# Patient Record
Sex: Male | Born: 1975 | Race: White | Hispanic: No | Smoking: Current every day smoker
Health system: Southern US, Community
[De-identification: ages and names within clinical notes are randomized; demographics above are authoritative.]

## PROBLEM LIST (undated history)

## (undated) DIAGNOSIS — F429 Obsessive-compulsive disorder, unspecified: Secondary | ICD-10-CM

## (undated) DIAGNOSIS — F419 Anxiety disorder, unspecified: Secondary | ICD-10-CM

## (undated) DIAGNOSIS — R519 Headache, unspecified: Secondary | ICD-10-CM

## (undated) DIAGNOSIS — D3610 Benign neoplasm of peripheral nerves and autonomic nervous system, unspecified: Secondary | ICD-10-CM

## (undated) DIAGNOSIS — K219 Gastro-esophageal reflux disease without esophagitis: Secondary | ICD-10-CM

## (undated) HISTORY — DX: Headache, unspecified: R51.9

## (undated) HISTORY — DX: Anxiety disorder, unspecified: F41.9

## (undated) HISTORY — DX: Obsessive-compulsive disorder, unspecified: F42.9

## (undated) HISTORY — DX: Gastro-esophageal reflux disease without esophagitis: K21.9

## (undated) HISTORY — PX: WISDOM TOOTH EXTRACTION: SHX21

---

## 2001-04-30 ENCOUNTER — Emergency Department (HOSPITAL_COMMUNITY): Admission: EM | Admit: 2001-04-30 | Discharge: 2001-04-30 | Payer: Self-pay | Admitting: Emergency Medicine

## 2002-09-09 HISTORY — PX: CIRCUMCISION: SUR203

## 2002-10-26 ENCOUNTER — Emergency Department (HOSPITAL_COMMUNITY): Admission: EM | Admit: 2002-10-26 | Discharge: 2002-10-26 | Payer: Self-pay | Admitting: *Deleted

## 2002-12-20 ENCOUNTER — Emergency Department (HOSPITAL_COMMUNITY): Admission: EM | Admit: 2002-12-20 | Discharge: 2002-12-20 | Payer: Self-pay | Admitting: Emergency Medicine

## 2003-01-17 ENCOUNTER — Emergency Department (HOSPITAL_COMMUNITY): Admission: EM | Admit: 2003-01-17 | Discharge: 2003-01-18 | Payer: Self-pay | Admitting: *Deleted

## 2003-02-12 ENCOUNTER — Emergency Department (HOSPITAL_COMMUNITY): Admission: EM | Admit: 2003-02-12 | Discharge: 2003-02-12 | Payer: Self-pay | Admitting: Emergency Medicine

## 2003-04-22 ENCOUNTER — Emergency Department (HOSPITAL_COMMUNITY): Admission: EM | Admit: 2003-04-22 | Discharge: 2003-04-23 | Payer: Self-pay | Admitting: Emergency Medicine

## 2004-03-15 ENCOUNTER — Emergency Department (HOSPITAL_COMMUNITY): Admission: EM | Admit: 2004-03-15 | Discharge: 2004-03-15 | Payer: Self-pay | Admitting: Family Medicine

## 2004-06-10 ENCOUNTER — Emergency Department (HOSPITAL_COMMUNITY): Admission: EM | Admit: 2004-06-10 | Discharge: 2004-06-10 | Payer: Self-pay | Admitting: Family Medicine

## 2004-06-12 ENCOUNTER — Emergency Department (HOSPITAL_COMMUNITY): Admission: EM | Admit: 2004-06-12 | Discharge: 2004-06-12 | Payer: Self-pay | Admitting: Emergency Medicine

## 2005-10-01 ENCOUNTER — Emergency Department (HOSPITAL_COMMUNITY): Admission: EM | Admit: 2005-10-01 | Discharge: 2005-10-01 | Payer: Self-pay | Admitting: Family Medicine

## 2005-10-01 ENCOUNTER — Ambulatory Visit (HOSPITAL_COMMUNITY): Admission: RE | Admit: 2005-10-01 | Discharge: 2005-10-01 | Payer: Self-pay | Admitting: Family Medicine

## 2005-10-07 ENCOUNTER — Emergency Department (HOSPITAL_COMMUNITY): Admission: EM | Admit: 2005-10-07 | Discharge: 2005-10-07 | Payer: Self-pay | Admitting: Family Medicine

## 2006-01-30 ENCOUNTER — Emergency Department: Payer: Self-pay | Admitting: General Practice

## 2006-03-03 ENCOUNTER — Emergency Department (HOSPITAL_COMMUNITY): Admission: EM | Admit: 2006-03-03 | Discharge: 2006-03-03 | Payer: Self-pay | Admitting: Family Medicine

## 2006-03-07 ENCOUNTER — Emergency Department (HOSPITAL_COMMUNITY): Admission: EM | Admit: 2006-03-07 | Discharge: 2006-03-07 | Payer: Self-pay | Admitting: Emergency Medicine

## 2006-06-19 ENCOUNTER — Emergency Department (HOSPITAL_COMMUNITY): Admission: EM | Admit: 2006-06-19 | Discharge: 2006-06-19 | Payer: Self-pay | Admitting: Family Medicine

## 2006-06-26 ENCOUNTER — Emergency Department (HOSPITAL_COMMUNITY): Admission: EM | Admit: 2006-06-26 | Discharge: 2006-06-26 | Payer: Self-pay | Admitting: Family Medicine

## 2009-09-09 HISTORY — PX: WISDOM TOOTH EXTRACTION: SHX21

## 2011-01-09 ENCOUNTER — Encounter: Payer: Self-pay | Admitting: Family Medicine

## 2011-01-09 NOTE — Progress Notes (Signed)
Subjective:    Patient ID: Christopher Wyatt, male    DOB: [birth year], 35 y.o.   MRN: [id number]  HPI Comments: Prozac makes him wake up sleepy and feels weird and therefore patient stopped medications for the past 1 week. The  effexor worked the best for him but he wanted a  Change for unspecified reasons. Review of the  Record shows he  Frequently requests changes in medicines without adequate trial.  Anxiety Symptoms include decreased concentration, excessive worry and malaise. Patient reports no chest pain, compulsions, confusion, depressed mood, dizziness, dry mouth, feeling of choking, hyperventilation, impotence, insomnia, irritability, muscle tension, nausea, nervous/anxious behavior, obsessions, palpitations, panic, restlessness, shortness of breath or suicidal ideas. Symptoms occur most days. The severity of symptoms is moderate. The quality of sleep is poor. Nighttime awakenings: one to two.   Compliance with medications is 0-25%.   Past Medical History  Diagnosis Date  . Anxiety     Chronic  . OCD (obsessive compulsive disorder)    No past surgical history on file. No current outpatient prescriptions on file prior to visit.   Allergies  Allergen Reactions  . Paxil     Erectile dysfunction    There is no immunization history on file for this patient. History   Social History  . Marital Status: Single    Spouse Name: N/A    Number of Children: N/A  . Years of Education: N/A   Occupational History  . Not on file.   Social History Main Topics  . Smoking status: Current Everyday Smoker -- 1.0 packs/day    Types: Cigarettes  . Smokeless tobacco: Not on file  . Alcohol Use: Yes  . Drug Use: No  . Sexually Active: Not on file   Other Topics Concern  . Not on file   Social History Narrative  . No narrative on file      Review of Systems  Constitutional: Positive for fatigue. Negative for fever, chills, diaphoresis, activity change, appetite change,  irritability and unexpected weight change.  HENT: Negative.   Eyes: Negative.   Respiratory: Negative.  Negative for shortness of breath.   Cardiovascular: Negative.  Negative for chest pain and palpitations.  Gastrointestinal: Negative.  Negative for nausea.  Genitourinary: Negative.  Negative for impotence.  Musculoskeletal: Negative.   Neurological: Negative.  Negative for dizziness, tremors, seizures, syncope, speech difficulty, weakness, light-headedness and numbness.  Hematological: Negative.   Psychiatric/Behavioral: Positive for behavioral problems and decreased concentration. Negative for suicidal ideas, hallucinations, confusion, dysphoric mood and agitation. The patient is not nervous/anxious and does not have insomnia.    BP 100/80  Pulse 72  Temp 96.8 F (36 C)  Resp 20  Wt 173 lb (78.472 kg)    Objective:   Physical Exam  Constitutional: He is oriented to person, place, and time. He appears well-developed and well-nourished.  HENT:  Head: Normocephalic and atraumatic.  Eyes: Conjunctivae and EOM are normal. Pupils are equal, round, and reactive to light.  Neck: Normal range of motion. Neck supple. No JVD present. No tracheal deviation present. No thyromegaly present.  Cardiovascular: Normal rate and normal heart sounds.   Pulmonary/Chest: Effort normal and breath sounds normal. No stridor.  Abdominal: Soft. Bowel sounds are normal.  Musculoskeletal: Normal range of motion.  Neurological: He is alert and oriented to person, place, and time. He has normal reflexes.  Skin: Skin is warm and dry.  Psychiatric: Thought content normal. His mood appears anxious. His affect is not angry, not blunt,  not labile and not inappropriate. His speech is rapid and/or pressured. His speech is not delayed, not tangential and not slurred. He is is hyperactive. He is not actively hallucinating. Thought content is not paranoid and not delusional. Cognition and memory are normal. He expresses  impulsivity. He does not express inappropriate judgment. He does not exhibit a depressed mood. He expresses no homicidal and no suicidal ideation. He expresses no suicidal plans and no homicidal plans. He is communicative.       Pressure of  Speech restless. He is attentive.          Assessment & Plan:  General anxiety disorder OCD  Plan:  Rx Effexor XR 75 mg daily for  1 week then increase to 2 tablets daily #60 RFx2. D/C and  Stay off the Prozac. Advised patient the need for compliance and  Consistency and give the  Medications time to work. Our next option is to consider Psychiatry evaluation and treatment. Discussed with patient that he has a history of not staying on medications as prescirbed. RTc in  4 weeks.

## 2011-03-21 ENCOUNTER — Ambulatory Visit (INDEPENDENT_AMBULATORY_CARE_PROVIDER_SITE_OTHER): Payer: Self-pay | Admitting: Otolaryngology

## 2011-03-21 DIAGNOSIS — K121 Other forms of stomatitis: Secondary | ICD-10-CM

## 2011-03-21 DIAGNOSIS — R070 Pain in throat: Secondary | ICD-10-CM

## 2012-10-08 ENCOUNTER — Ambulatory Visit (INDEPENDENT_AMBULATORY_CARE_PROVIDER_SITE_OTHER): Payer: Medicaid Other | Admitting: Otolaryngology

## 2012-10-08 DIAGNOSIS — H608X9 Other otitis externa, unspecified ear: Secondary | ICD-10-CM

## 2012-12-01 ENCOUNTER — Encounter: Payer: Self-pay | Admitting: Family Medicine

## 2012-12-01 ENCOUNTER — Ambulatory Visit (INDEPENDENT_AMBULATORY_CARE_PROVIDER_SITE_OTHER): Payer: Medicaid Other | Admitting: Family Medicine

## 2012-12-01 VITALS — BP 120/86 | HR 86 | Temp 98.1°F | Resp 16 | Wt 179.0 lb

## 2012-12-01 DIAGNOSIS — F429 Obsessive-compulsive disorder, unspecified: Secondary | ICD-10-CM | POA: Insufficient documentation

## 2012-12-01 DIAGNOSIS — F419 Anxiety disorder, unspecified: Secondary | ICD-10-CM | POA: Insufficient documentation

## 2012-12-01 DIAGNOSIS — F411 Generalized anxiety disorder: Secondary | ICD-10-CM

## 2012-12-01 MED ORDER — ESCITALOPRAM OXALATE 10 MG PO TABS: 10.0000 mg | ORAL_TABLET | Freq: Every day | ORAL | Status: DC
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
## 2012-12-28 ENCOUNTER — Telehealth: Payer: Self-pay | Admitting: Family Medicine

## 2012-12-28 DIAGNOSIS — F411 Generalized anxiety disorder: Secondary | ICD-10-CM

## 2012-12-28 NOTE — Telephone Encounter (Signed)
We can increase to 20 mg poqdaay

## 2012-12-29 MED ORDER — ESCITALOPRAM OXALATE 20 MG PO TABS: 20.0000 mg | ORAL_TABLET | Freq: Every day | ORAL | Status: DC

## 2012-12-29 NOTE — Telephone Encounter (Signed)
rf rx per protocol

## 2013-02-26 ENCOUNTER — Ambulatory Visit (INDEPENDENT_AMBULATORY_CARE_PROVIDER_SITE_OTHER): Payer: Medicaid Other | Admitting: Physician Assistant

## 2013-02-26 ENCOUNTER — Encounter: Payer: Self-pay | Admitting: Physician Assistant

## 2013-02-26 ENCOUNTER — Ambulatory Visit (INDEPENDENT_AMBULATORY_CARE_PROVIDER_SITE_OTHER): Payer: Medicaid Other

## 2013-02-26 VITALS — BP 117/71 | HR 86 | Temp 99.1°F | Ht 71.5 in | Wt 171.0 lb

## 2013-02-26 DIAGNOSIS — M25561 Pain in right knee: Secondary | ICD-10-CM

## 2013-02-26 DIAGNOSIS — M25569 Pain in unspecified knee: Secondary | ICD-10-CM

## 2013-02-26 MED ORDER — MELOXICAM 15 MG PO TABS: 15.0000 mg | ORAL_TABLET | Freq: Every day | ORAL | Status: DC

## 2013-02-26 NOTE — Progress Notes (Signed)
Subjective:     Patient ID: Christopher Wyatt, male   DOB: 1976/05/08, 36 y.o.   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       Objective:   Physical Exam  Nursing note and vitals reviewed. + effusion to the R knee +++ TTP of the medial knee(ant and lateral) Decrease in ROM due to sx + patellar click + patellar compression Good strength distal Xray- no loose bodies or fx, will be over-read      Assessment:     R knee pain    Plan:     Trial of Mobic Neoprene brace with patellar stabilizer for work Work note for [employer] F/u if sx cont may need rf to Ortho

## 2013-02-26 NOTE — Patient Instructions (Addendum)
Smoking Cessation Quitting smoking is important to your health and has many advantages. However, it is not always easy to quit since nicotine is a very addictive drug. Often times, people try 3 times or more before being able to quit. This document explains the best ways for you to prepare to quit smoking. Quitting takes hard work and a lot of effort, but you can do it. ADVANTAGES OF QUITTING SMOKING  You will live longer, feel better, and live better.  Your body will feel the impact of quitting smoking almost immediately.  Within 20 minutes, blood pressure decreases. Your pulse returns to its normal level.  After 8 hours, carbon monoxide levels in the blood return to normal. Your oxygen level increases.  After 24 hours, the chance of having a heart attack starts to decrease. Your breath, hair, and body stop smelling like smoke.  After 48 hours, damaged nerve endings begin to recover. Your sense of taste and smell improve.  After 72 hours, the body is virtually free of nicotine. Your bronchial tubes relax and breathing becomes easier.  After 2 to 12 weeks, lungs can hold more air. Exercise becomes easier and circulation improves.  The risk of having a heart attack, stroke, cancer, or lung disease is greatly reduced.  After 1 year, the risk of coronary heart disease is cut in half.  After 5 years, the risk of stroke falls to the same as a nonsmoker.  After 10 years, the risk of lung cancer is cut in half and the risk of other cancers decreases significantly.  After 15 years, the risk of coronary heart disease drops, usually to the level of a nonsmoker.  If you are pregnant, quitting smoking will improve your chances of having a healthy baby.  The people you live with, especially any children, will be healthier.  You will have extra money to spend on things other than cigarettes. QUESTIONS TO THINK ABOUT BEFORE ATTEMPTING TO QUIT You may want to talk about your answers with your  caregiver.  Why do you want to quit?  If you tried to quit in the past, what helped and what did not?  What will be the most difficult situations for you after you quit? How will you plan to handle them?  Who can help you through the tough times? Your family? Friends? A caregiver?  What pleasures do you get from smoking? What ways can you still get pleasure if you quit? Here are some questions to ask your caregiver:  How can you help me to be successful at quitting?  What medicine do you think would be best for me and how should I take it?  What should I do if I need more help?  What is smoking withdrawal like? How can I get information on withdrawal? GET READY  Set a quit date.  Change your environment by getting rid of all cigarettes, ashtrays, matches, and lighters in your home, car, or work. Do not let people smoke in your home.  Review your past attempts to quit. Think about what worked and what did not. GET SUPPORT AND ENCOURAGEMENT You have a better chance of being successful if you have help. You can get support in many ways.  Tell your family, friends, and co-workers that you are going to quit and need their support. Ask them not to smoke around you.  Get individual, group, or telephone counseling and support. Programs are available at local hospitals and health centers. Call your local health department for   information about programs in your area.  Spiritual beliefs and practices may help some smokers quit.  Download a "quit meter" on your computer to keep track of quit statistics, such as how long you have gone without smoking, cigarettes not smoked, and money saved.  Get a self-help book about quitting smoking and staying off of tobacco. LEARN NEW SKILLS AND BEHAVIORS  Distract yourself from urges to smoke. Talk to someone, go for a walk, or occupy your time with a task.  Change your normal routine. Take a different route to work. Drink tea instead of coffee.  Eat breakfast in a different place.  Reduce your stress. Take a hot bath, exercise, or read a book.  Plan something enjoyable to do every day. Reward yourself for not smoking.  Explore interactive web-based programs that specialize in helping you quit. GET MEDICINE AND USE IT CORRECTLY Medicines can help you stop smoking and decrease the urge to smoke. Combining medicine with the above behavioral methods and support can greatly increase your chances of successfully quitting smoking.  Nicotine replacement therapy helps deliver nicotine to your body without the negative effects and risks of smoking. Nicotine replacement therapy includes nicotine gum, lozenges, inhalers, nasal sprays, and skin patches. Some may be available over-the-counter and others require a prescription.  Antidepressant medicine helps people abstain from smoking, but how this works is unknown. This medicine is available by prescription.  Nicotinic receptor partial agonist medicine simulates the effect of nicotine in your brain. This medicine is available by prescription. Ask your caregiver for advice about which medicines to use and how to use them based on your health history. Your caregiver will tell you what side effects to look out for if you choose to be on a medicine or therapy. Carefully read the information on the package. Do not use any other product containing nicotine while using a nicotine replacement product.  RELAPSE OR DIFFICULT SITUATIONS Most relapses occur within the first 3 months after quitting. Do not be discouraged if you start smoking again. Remember, most people try several times before finally quitting. You may have symptoms of withdrawal because your body is used to nicotine. You may crave cigarettes, be irritable, feel very hungry, cough often, get headaches, or have difficulty concentrating. The withdrawal symptoms are only temporary. They are strongest when you first quit, but they will go away within  10 14 days. To reduce the chances of relapse, try to:  Avoid drinking alcohol. Drinking lowers your chances of successfully quitting.  Reduce the amount of caffeine you consume. Once you quit smoking, the amount of caffeine in your body increases and can give you symptoms, such as a rapid heartbeat, sweating, and anxiety.  Avoid smokers because they can make you want to smoke.  Do not let weight gain distract you. Many smokers will gain weight when they quit, usually less than 10 pounds. Eat a healthy diet and stay active. You can always lose the weight gained after you quit.  Find ways to improve your mood other than smoking. FOR MORE INFORMATION  www.smokefree.gov  Document Released: 08/20/2001 Document Revised: 02/25/2012 Document Reviewed: 12/05/2011 [facility] Patient Information 2014 [place], [place]. Patella Problems (Patellofemoral Syndrome) This syndrome is caused by changes in the undersurface of the kneecap (patella). The changes vary from minor inflammation to major changes such as breakdown of the cartilage on the undersurface of the patella. The major changes can be seen with an arthroscope (a small, pencil-sized telescope). These changes can result from various factors.  These factors may arise from abnormal tracking (movement or malalignment) of the patella. Normally the Patella is in its normal groove located between the condyles (grooved end) of the femur (thigh bone). Abnormal movement leads to increased pressure in the patellofemoral joint. This leads to swelling in the cartilage, inflammation and pain. SYMPTOMS  The patient with this syndrome usually has an ache in the knee. It is often aggravated by:  Prolonged sitting.  Squatting.  Climbing stairs.  Running down hill.  Other exercising that stresses the knee. Other findings may include the knee giving way, swelling, and or locking. TREATMENT  The treatment will depend on the cause of the problem. Sometimes the  solution is as simple as cutting down on activities. Giving your joint a rest with the use of crutches and braces can also help. This is generally followed by strengthening exercises. RECOVERY Recovery from a patellar problem depends on the type of problem in your knee and on the treatment required. If conservative treatment works the recovery period may be as little as three to four weeks. If more aggressive therapy such as surgery is required, the recovery period may be several months. Your caregiver will discuss this with you. HOME CARE INSTRUCTIONS  Following exercise, use an ice pack for twenty to thirty minutes three to four times per day. Use a towel between your ice pack and the skin.  Reduction of inflammation with anti-inflammatories may be helpful. Only take over-the-counter or prescription medicines for pain, discomfort, or fever as directed by your caregiver.  Taping the knee or using a neoprene sleeve with a patellar cutout to provide better tracking of the patella may give relief.  Muscle (quadriceps) strengthening exercises are helpful. Follow your caregiver's advice.  Muscle stretching prior to exercise may be helpful.  Soft tissue therapy using ultrasound, and diathermy may be helpful.  If conservative therapy is not effective, surgery may provide relief. During arthroscopy, your caregiver may discover a rough surface beneath your kneecap. If this happens, your caregiver may smooth this out by shaving the surface. SEEK MEDICAL CARE IF: If you have surgery, see your caregiver if:  There is increased bleeding or clear fluid (more than a small spot) from the wound.  You notice redness, swelling, or increasing pain in the wound.  Pus is coming from wound.  You develop an unexplained oral temperature above 102 F (38.9 C) develops, or as your caregiver suggests.  You notice a foul smell coming from the wound or dressing.  You develop increasing pain or stiffness in your  knee. SEEK IMMEDIATE MEDICAL CARE IF:   You develop a rash.  You have difficulty breathing.  You have any allergic problems. MAKE SURE YOU:   Understand these instructions.  Will watch your condition.  Will get help right away if you are not doing well or get worse. Document Released: 08/23/2000 Document Revised: 11/18/2011 Document Reviewed: 09/12/2008 [facility] Patient Information 2014 [place], [place].

## 2013-03-01 ENCOUNTER — Ambulatory Visit: Payer: Self-pay | Admitting: Family Medicine

## 2013-05-12 ENCOUNTER — Other Ambulatory Visit: Payer: Self-pay | Admitting: Family Medicine

## 2013-05-13 ENCOUNTER — Other Ambulatory Visit: Payer: Self-pay | Admitting: Family Medicine

## 2013-05-13 NOTE — Telephone Encounter (Signed)
Wants to know if you can up his LEXAPRO dose to see if it helps. Pt states he hasnt notice any effects since you put him on it.

## 2013-05-14 ENCOUNTER — Encounter (HOSPITAL_COMMUNITY): Payer: Self-pay | Admitting: Emergency Medicine

## 2013-05-14 ENCOUNTER — Telehealth: Payer: Self-pay | Admitting: Family Medicine

## 2013-05-14 ENCOUNTER — Emergency Department (HOSPITAL_COMMUNITY): Payer: Medicaid Other

## 2013-05-14 ENCOUNTER — Emergency Department (HOSPITAL_COMMUNITY)
Admission: EM | Admit: 2013-05-14 | Discharge: 2013-05-14 | Disposition: A | Discharge status: Home / Self Care | Payer: Medicaid Other | Attending: Emergency Medicine | Admitting: Emergency Medicine

## 2013-05-14 DIAGNOSIS — W208XXA Other cause of strike by thrown, projected or falling object, initial encounter: Secondary | ICD-10-CM | POA: Insufficient documentation

## 2013-05-14 DIAGNOSIS — F411 Generalized anxiety disorder: Secondary | ICD-10-CM | POA: Insufficient documentation

## 2013-05-14 DIAGNOSIS — S9030XA Contusion of unspecified foot, initial encounter: Secondary | ICD-10-CM | POA: Insufficient documentation

## 2013-05-14 DIAGNOSIS — F429 Obsessive-compulsive disorder, unspecified: Secondary | ICD-10-CM | POA: Insufficient documentation

## 2013-05-14 DIAGNOSIS — Y990 Civilian activity done for income or pay: Secondary | ICD-10-CM | POA: Insufficient documentation

## 2013-05-14 DIAGNOSIS — Y939 Activity, unspecified: Secondary | ICD-10-CM | POA: Insufficient documentation

## 2013-05-14 DIAGNOSIS — Y929 Unspecified place or not applicable: Secondary | ICD-10-CM | POA: Insufficient documentation

## 2013-05-14 DIAGNOSIS — Z79899 Other long term (current) drug therapy: Secondary | ICD-10-CM | POA: Insufficient documentation

## 2013-05-14 DIAGNOSIS — S9032XA Contusion of left foot, initial encounter: Secondary | ICD-10-CM

## 2013-05-14 DIAGNOSIS — F17200 Nicotine dependence, unspecified, uncomplicated: Secondary | ICD-10-CM | POA: Insufficient documentation

## 2013-05-14 MED ORDER — HYDROCODONE-ACETAMINOPHEN 5-325 MG PO TABS: 1.0000 | ORAL_TABLET | Freq: Four times a day (QID) | ORAL | Status: DC | PRN

## 2013-05-14 MED ORDER — OXYCODONE-ACETAMINOPHEN 5-325 MG PO TABS
2.0000 | ORAL_TABLET | Freq: Once | ORAL | Status: AC
  Administered 2013-05-14: 2 via ORAL
  Filled 2013-05-14: qty 2

## 2013-05-14 NOTE — Telephone Encounter (Signed)
In ED last PM with foot injury.  In severe pain.  They did not give him an Rx for pain.  Told him to take Tylenol. Please call him in something for pain.  Already has routine appt with on Monday.

## 2013-05-14 NOTE — ED Provider Notes (Signed)
CSN: [id number]     Arrival date & time 05/14/13  0551 History   First MD Initiated Contact with Patient 05/14/13 0600     Chief Complaint  Patient presents with  . Foot Injury   Patient is a 37 y.o. male presenting with foot injury. The history is provided by the patient.  Foot Injury Location:  Foot Time since incident:  4 hours Injury: yes   Foot location:  L foot Pain details:    Quality:  Aching   Severity:  Moderate   Onset quality:  Sudden   Timing:  Constant   Progression:  Worsening Chronicity:  New Relieved by:  Rest Worsened by:  Activity pt reports he dropped heavy chain on his left foot. No other injury reported This occurred at work  Past Medical History  Diagnosis Date  . OCD (obsessive compulsive disorder)   . Anxiety     Chronic   History reviewed. No pertinent past surgical history. No family history on file. History  Substance Use Topics  . Smoking status: Current Every Day Smoker -- 1.00 packs/day    Types: Cigarettes  . Smokeless tobacco: Not on file  . Alcohol Use: Yes    Review of Systems  Musculoskeletal: Positive for joint swelling.  Neurological: Negative for weakness.    Allergies  Paroxetine hcl  Home Medications   Current Outpatient Rx  Name  Route  Sig  Dispense  Refill  . escitalopram (LEXAPRO) 20 MG tablet   Oral   Take 1 tablet (20 mg total) by mouth daily.   30 tablet   3   . meloxicam (MOBIC) 15 MG tablet   Oral   Take 1 tablet (15 mg total) by mouth daily.   15 tablet   0    BP 138/89  Pulse 96  Temp(Src) 98.2 F (36.8 C) (Oral)  Resp 20  Ht 6' (1.829 m)  Wt 178 lb (80.74 kg)  BMI 24.14 kg/m2  SpO2 98% Physical Exam CONSTITUTIONAL: Well developed/well nourished HEAD: Normocephalic/atraumatic EYES: EOMI ENMT: Mucous membranes moist NECK: supple no meningeal signs CV: S1/S2 noted, no murmurs/rubs/gallops noted LUNGS: Lungs are clear to auscultation bilaterally, no apparent distress ABDOMEN: soft,  nontender, no rebound or guarding NEURO: Pt is awake/alert, moves all extremitiesx4 EXTREMITIES: pulses normal, full ROM Tenderness with some bruising along left great toe and left 2nd toe.  There is no deformity or laceration noted.  The left foot is warm to touch.  There is no obvious subungual hematoma noted.  He is able move his toes There is no tenderness to left ankle. SKIN: warm, color normal PSYCH: no abnormalities of mood noted  ED Course  Procedures (including critical care time) Labs Review Labs Reviewed - No data to display Imaging Review No results found.  MDM  No diagnosis found. Nursing notes including past medical history and social history reviewed and considered in documentation xrays reviewed and considered     Joya Gaskins, MD 05/14/13 [phone]

## 2013-05-14 NOTE — Telephone Encounter (Signed)
Norco called to pharmacy and patient is aware.

## 2013-05-14 NOTE — Telephone Encounter (Signed)
He can have ten Norco 5/325 q 6 hrs prn pain called out.

## 2013-05-14 NOTE — ED Notes (Signed)
Patient states he dropped a chain and hook on his left foot at work.

## 2013-05-17 ENCOUNTER — Ambulatory Visit (INDEPENDENT_AMBULATORY_CARE_PROVIDER_SITE_OTHER): Payer: Medicaid Other | Admitting: Family Medicine

## 2013-05-17 ENCOUNTER — Encounter: Payer: Self-pay | Admitting: Family Medicine

## 2013-05-17 VITALS — BP 110/68 | HR 80 | Temp 98.2°F | Resp 16 | Wt 177.0 lb

## 2013-05-17 DIAGNOSIS — F411 Generalized anxiety disorder: Secondary | ICD-10-CM

## 2013-05-17 MED ORDER — ESCITALOPRAM OXALATE 20 MG PO TABS: 20.0000 mg | ORAL_TABLET | Freq: Every day | ORAL | Status: DC

## 2013-05-17 MED ORDER — BUSPIRONE HCL 7.5 MG PO TABS: 7.5000 mg | ORAL_TABLET | Freq: Two times a day (BID) | ORAL | Status: DC | PRN

## 2013-05-17 NOTE — Progress Notes (Signed)
Subjective:    Patient ID: Christopher Wyatt, male    DOB: 03-17-1976, 37 y.o.   MRN: [id number]  HPI  Patient has been on Lexapro 20 mg by mouth daily since March. He is very satisfied with the way the medicine is working. He states that he is much improved. On most days, he does not feel any anxiety at all. On occasion, he reports low-level anxiety that feels like a panic attack is about to happen, but it never does. He has not had a panic attack in the last 6 months.  He would like to increase the Lexapro to try to compensate for this.  He is very hesitant to take any addictive medication.  He is currently on Norco for pain in his foot. He dropped a chain on his left great toe at work. He suffered a severe contusion to the left great toe. The toe nail looks like it will fall off eventually due to the subungal hematoma.  He is being treated for this through workers compensation. Past Medical History  Diagnosis Date  . OCD (obsessive compulsive disorder)   . Anxiety     Chronic   Current Outpatient Prescriptions on File Prior to Visit  Medication Sig Dispense Refill  . HYDROcodone-acetaminophen (NORCO) 5-325 MG per tablet Take 1 tablet by mouth every 6 (six) hours as needed for pain.  10 tablet  0  . meloxicam (MOBIC) 15 MG tablet Take 1 tablet (15 mg total) by mouth daily.  15 tablet  0   No current facility-administered medications on file prior to visit.   Allergies  Allergen Reactions  . Paroxetine Hcl     Erectile dysfunction   History   Social History  . Marital Status: Single    Spouse Name: N/A    Number of Children: N/A  . Years of Education: N/A   Occupational History  . Not on file.   Social History Main Topics  . Smoking status: Current Every Day Smoker -- 1.00 packs/day    Types: Cigarettes  . Smokeless tobacco: Not on file  . Alcohol Use: Yes  . Drug Use: No  . Sexual Activity: Not on file   Other Topics Concern  . Not on file   Social History Narrative   . No narrative on file     Review of Systems  All other systems reviewed and are negative.       Objective:   Physical Exam  Vitals reviewed. Constitutional: He appears well-developed and well-nourished.  Neck: Neck supple. No thyromegaly present.  Cardiovascular: Normal rate, regular rhythm and normal heart sounds.  Exam reveals no gallop and no friction rub.   No murmur heard. Pulmonary/Chest: Effort normal and breath sounds normal. No respiratory distress. He has no wheezes. He has no rales.  Abdominal: Soft. Bowel sounds are normal.  Lymphadenopathy:    He has no cervical adenopathy.   left great toe is bruised distal to the IP joint. The toe nail is partially separated secondary to a subungual hematoma.         Assessment & Plan:  1. GAD (generalized anxiety disorder) Continue Lexapro 20 mg by mouth daily. I explained to the patient that this is the maximum dose of this medication. I will add BuSpar 7.5 mg by mouth twice a day as needed for anxiety. I gave the patient 30 tablets with 0 refills. I warned that it could possibly make him sedated. I recommended he not drive when he takes this  medication. Also warned him about the potential for habituation with repeated use. - busPIRone (BUSPAR) 7.5 MG tablet; Take 1 tablet (7.5 mg total) by mouth 2 (two) times daily as needed (use as needed for anxiety).  Dispense: 30 tablet; Refill: 0 - escitalopram (LEXAPRO) 20 MG tablet; Take 1 tablet (20 mg total) by mouth daily.  Dispense: 30 tablet; Refill: 11

## 2013-06-11 ENCOUNTER — Telehealth: Payer: Self-pay | Admitting: Family Medicine

## 2013-06-11 NOTE — Telephone Encounter (Signed)
C/O UTI.  Has slight penile discharge with a lot of urgency and burning.  NTBS, no appt available today.  Offered appt for Monday, he is going to call back.  In meantime drink plenty of water, cranberry juice.

## 2013-06-14 ENCOUNTER — Telehealth: Payer: Self-pay | Admitting: Family Medicine

## 2013-06-14 NOTE — Telephone Encounter (Signed)
Please call him regarding his yeast infection.  He is having discomfort Call [phone]

## 2013-06-15 ENCOUNTER — Ambulatory Visit (INDEPENDENT_AMBULATORY_CARE_PROVIDER_SITE_OTHER): Payer: Medicaid Other | Admitting: Family Medicine

## 2013-06-15 ENCOUNTER — Encounter: Payer: Self-pay | Admitting: Family Medicine

## 2013-06-15 ENCOUNTER — Ambulatory Visit: Payer: Medicaid Other | Admitting: Family Medicine

## 2013-06-15 VITALS — BP 110/70 | HR 68 | Temp 98.6°F | Resp 18 | Wt 174.0 lb

## 2013-06-15 DIAGNOSIS — N390 Urinary tract infection, site not specified: Secondary | ICD-10-CM | POA: Insufficient documentation

## 2013-06-15 DIAGNOSIS — R369 Urethral discharge, unspecified: Secondary | ICD-10-CM

## 2013-06-15 LAB — URINALYSIS, ROUTINE W REFLEX MICROSCOPIC
Bilirubin Urine: NEGATIVE
Glucose, UA: NEGATIVE mg/dL
Ketones, ur: NEGATIVE mg/dL
Nitrite: NEGATIVE
Protein, ur: 100 mg/dL — AB
Specific Gravity, Urine: >1.03 — ABNORMAL HIGH (ref 1.005–1.030)
Urobilinogen, UA: 2 mg/dL — ABNORMAL HIGH (ref 0.0–1.0)
pH: 6 (ref 5.0–8.0)

## 2013-06-15 LAB — URINALYSIS, MICROSCOPIC ONLY
Casts: NONE SEEN
Crystals: NONE SEEN

## 2013-06-15 MED ORDER — CIPROFLOXACIN HCL 500 MG PO TABS: 500.0000 mg | ORAL_TABLET | Freq: Two times a day (BID) | ORAL | Status: DC

## 2013-06-15 MED ORDER — PHENAZOPYRIDINE HCL 100 MG PO TABS: 100.0000 mg | ORAL_TABLET | Freq: Three times a day (TID) | ORAL | Status: DC | PRN

## 2013-06-15 NOTE — Progress Notes (Signed)
  Subjective:    Patient ID: Christopher Wyatt, male    DOB: Feb 12, 1976, 37 y.o.   MRN: [id number]  HPI  Patient here with dysuria and drainage from his penis for the past week. He sexually active states he has one partner however they have variations in their sexual activity including anal sex. He has has had no recent sexually transmitted diseases. He states that he noticed a milky white discharge from his urethra and he also has some redness and crusting around the penis. He has a piercing through his penis which he has not cleaned this week secondary to tenderness. Over the past week he's had a lot of burning sensation with a urethral discharge where he goes every 30-40 minutes at times he only dribbles. He denies any abdominal pain or fever. He's also noticed a tender spot in his groin and he thought he pulled a muscle.  He states that he is very nervous and does not want me to examine him. He wanted to swab himself for a urethral discharge. We offered to have him see our male provider and he refused this as well  Review of Systems - per above  GEN- denies fatigue, fever, weight loss,weakness, recent illness CVS- denies chest pain, palpitations RESP- denies SOB, cough, wheeze ABD- denies N/V, change in stools, abd pain GU-+dysuria, hematuria, dribbling, incontinence        Objective:   Physical Exam GEN-NAD, alert and oriented x 3 ABD-NABS,soft, NT, ND Lymph- Right inguinal lymph node, TTP GU-Pt Refused       Assessment & Plan:

## 2013-06-15 NOTE — Assessment & Plan Note (Signed)
Neg trichomonas seen in urine Pt swabbed himself for GC/Chlamydia, refused exam multiple times Refused IM injection States he is monogamous  Very difficult to get him to understand importance of exam/meds

## 2013-06-15 NOTE — Telephone Encounter (Signed)
Pt has appt today

## 2013-06-15 NOTE — Assessment & Plan Note (Signed)
Urine sent for culture. Possible prostatitis as well but he declined exam For now will start cipro which will cover prostate infection, UTI, await further results pyridum also given

## 2013-06-15 NOTE — Patient Instructions (Signed)
Take the antibiotics as prescribed  Take the pyridium Drink plenty of water  F/U as previous

## 2013-06-16 LAB — URINE CULTURE
Colony Count: NO GROWTH
Organism ID, Bacteria: NO GROWTH

## 2013-06-17 LAB — GC/CHLAMYDIA PROBE AMP
CT Probe RNA: NEGATIVE
GC Probe RNA: POSITIVE — AB

## 2013-06-18 ENCOUNTER — Ambulatory Visit (INDEPENDENT_AMBULATORY_CARE_PROVIDER_SITE_OTHER): Payer: Medicaid Other | Admitting: Family Medicine

## 2013-06-18 DIAGNOSIS — A5400 Gonococcal infection of lower genitourinary tract, unspecified: Secondary | ICD-10-CM

## 2013-06-18 DIAGNOSIS — A549 Gonococcal infection, unspecified: Secondary | ICD-10-CM

## 2013-06-18 MED ORDER — CEFTRIAXONE SODIUM 250 MG IJ SOLR
250.0000 mg | Freq: Once | INTRAMUSCULAR | Status: AC
  Administered 2013-06-18: 250 mg via INTRAMUSCULAR

## 2013-06-18 MED ORDER — AZITHROMYCIN 500 MG PO TABS: ORAL_TABLET | ORAL | Status: DC

## 2013-06-18 NOTE — Progress Notes (Signed)
Im ceftriaxone 250 mg IM per provider order.  [facility] [place] DHHS Communicable Disease report completed and faxed to [facility] Department

## 2013-06-18 NOTE — Patient Instructions (Signed)
Take the azithromycin as directed Rocephin Injection given

## 2013-06-20 NOTE — Progress Notes (Signed)
Patient ID: Christopher Wyatt, male   DOB: 1976/04/21, 37 y.o.   MRN: [id number]  Pt here to discuss lab results. He is positive for Gonorrhea, UC neg, Trichomonas neg, chlamydia neg. States monogomous relationship. Discussed transmission of STD He has been on Cipro but this has failing resistance and no true UTI.   After about 10 minutes of discussion pt agreed to have Rocephin injection-  Will also take Azithromycin 1gram x 1   Discussed he will need to have his fiancee treated, she can go to health department if needed  Nurse to report STD to [facility]   Stop Cipro No sexually activity for 1 week,  Discussed wearing condoms

## 2013-07-03 ENCOUNTER — Other Ambulatory Visit: Payer: Self-pay | Admitting: Family Medicine

## 2013-07-05 NOTE — Telephone Encounter (Signed)
Ok to refill 

## 2013-07-05 NOTE — Telephone Encounter (Signed)
ok 

## 2013-07-12 ENCOUNTER — Ambulatory Visit: Payer: Medicaid Other | Admitting: Family Medicine

## 2013-07-16 ENCOUNTER — Encounter: Payer: Self-pay | Admitting: Family Medicine

## 2013-07-16 ENCOUNTER — Ambulatory Visit (INDEPENDENT_AMBULATORY_CARE_PROVIDER_SITE_OTHER): Payer: Self-pay | Admitting: Family Medicine

## 2013-07-16 VITALS — BP 128/80 | HR 68 | Temp 98.2°F | Resp 18 | Ht 71.0 in | Wt 174.0 lb

## 2013-07-16 DIAGNOSIS — M25569 Pain in unspecified knee: Secondary | ICD-10-CM

## 2013-07-16 DIAGNOSIS — M25561 Pain in right knee: Secondary | ICD-10-CM

## 2013-07-16 MED ORDER — HYDROCODONE-ACETAMINOPHEN 5-325 MG PO TABS: 1.0000 | ORAL_TABLET | Freq: Four times a day (QID) | ORAL | Status: DC | PRN

## 2013-07-16 MED ORDER — NAPROXEN 500 MG PO TABS: 500.0000 mg | ORAL_TABLET | Freq: Two times a day (BID) | ORAL | Status: DC

## 2013-07-16 NOTE — Patient Instructions (Signed)
Ice your knee  Naprosyn Twice a day for inflammation for next 2 weeks Take the pain pill as needed F/U as needed

## 2013-07-18 DIAGNOSIS — M25569 Pain in unspecified knee: Secondary | ICD-10-CM | POA: Insufficient documentation

## 2013-07-18 NOTE — Progress Notes (Signed)
  Subjective:    Patient ID: Christopher Wyatt, male    DOB: 1976/07/26, 37 y.o.   MRN: [id number]  HPI  Pt here with right knee pain for the past 2 days. History of recurrent right knee pain where his knee cap pops out of place. 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 He was evaluated in July of this year at [facility], xrays showed no acute abnormality. When his knee flares which is very few months, he feels like it may give out on him Asked for note for tonight due to ongoing pain.  Review of Systems  GEN- denies fatigue, fever, weight loss,weakness, recent illness MSK- + joint pain, muscle aches, injury       Objective:   Physical Exam GEN-NAD, alert and oriented x 3 MSK- Right knee- normal appearance, no effusion, no warmth, pain with extension, TTP medially and over patella, normal patella movement on exam, liagments in tact +crepitus right knee Left Knee- normal inspection, no effusion, FROM Bilateral Ankle- FROM       Assessment & Plan:

## 2013-07-18 NOTE — Assessment & Plan Note (Signed)
No effusion seen today ? If he is getting some subluxation at the patella He wants to hold off ortho right now due to insurance Given NSAIDS, 10 tablets of hydrocodone Work note for [employer]

## 2013-09-15 ENCOUNTER — Ambulatory Visit: Payer: Self-pay | Admitting: Physician Assistant

## 2013-09-24 ENCOUNTER — Ambulatory Visit: Payer: Medicaid Other | Admitting: Family Medicine

## 2013-10-05 ENCOUNTER — Ambulatory Visit: Payer: Medicaid Other | Admitting: Family Medicine

## 2013-10-06 ENCOUNTER — Ambulatory Visit: Payer: Medicaid Other | Admitting: Family Medicine

## 2013-10-15 ENCOUNTER — Ambulatory Visit (INDEPENDENT_AMBULATORY_CARE_PROVIDER_SITE_OTHER): Payer: Medicaid Other | Admitting: Family Medicine

## 2013-10-15 ENCOUNTER — Encounter: Payer: Self-pay | Admitting: Family Medicine

## 2013-10-15 VITALS — BP 128/76 | HR 80 | Temp 98.1°F | Resp 16 | Ht 72.0 in | Wt 174.0 lb

## 2013-10-15 DIAGNOSIS — K529 Noninfective gastroenteritis and colitis, unspecified: Secondary | ICD-10-CM

## 2013-10-15 DIAGNOSIS — K5289 Other specified noninfective gastroenteritis and colitis: Secondary | ICD-10-CM

## 2013-10-15 MED ORDER — PROMETHAZINE HCL 25 MG PO TABS: 25.0000 mg | ORAL_TABLET | Freq: Four times a day (QID) | ORAL | Status: DC | PRN

## 2013-10-15 NOTE — Progress Notes (Signed)
   Subjective:    Patient ID: Christopher Wyatt, male    DOB: 05-23-1976, 38 y.o.   MRN: [id number]  HPI Patient experienced an episode of abdominal cramping and vomiting this morning at about 5:30 in the morning. He then had explosive watery diarrhea. He has had 3 other episodes of vomiting and diarrhea up until his office visit. He denies any fevers although he reports chills. He denies any rhinorrhea cough or sore throat. He denies any otalgia or sinus pain. He denies any myalgias. Past Medical History  Diagnosis Date  . OCD (obsessive compulsive disorder)   . Anxiety     Chronic   Current Outpatient Prescriptions on File Prior to Visit  Medication Sig Dispense Refill  . busPIRone (BUSPAR) 7.5 MG tablet TAKE ONE TABLET BY MOUTH TWICE DAILY AS NEEDED FOR ANXIETY  30 tablet  0  . escitalopram (LEXAPRO) 20 MG tablet Take 1 tablet (20 mg total) by mouth daily.  30 tablet  11   No current facility-administered medications on file prior to visit.   Allergies  Allergen Reactions  . Paroxetine Hcl     Erectile dysfunction   History   Social History  . Marital Status: Single    Spouse Name: N/A    Number of Children: N/A  . Years of Education: N/A   Occupational History  . Not on file.   Social History Main Topics  . Smoking status: Current Every Day Smoker -- 1.00 packs/day    Types: Cigarettes  . Smokeless tobacco: Not on file  . Alcohol Use: Yes  . Drug Use: No  . Sexual Activity: Not on file   Other Topics Concern  . Not on file   Social History Narrative  . No narrative on file     Review of Systems  All other systems reviewed and are negative.       Objective:   Physical Exam  Vitals reviewed. Constitutional: He appears well-developed and well-nourished.  HENT:  Head: Normocephalic and atraumatic.  Right Ear: External ear normal.  Left Ear: External ear normal.  Nose: Nose normal.  Mouth/Throat: Oropharynx is clear and moist. No oropharyngeal exudate.   Eyes: Conjunctivae are normal. No scleral icterus.  Neck: Neck supple.  Cardiovascular: Normal rate, regular rhythm and normal heart sounds.   No murmur heard. Pulmonary/Chest: Effort normal and breath sounds normal. No respiratory distress. He has no wheezes. He has no rales.  Abdominal: Soft. Bowel sounds are normal. He exhibits no distension. There is no tenderness. There is no rebound and no guarding.  Lymphadenopathy:    He has no cervical adenopathy.          Assessment & Plan:  1. Gastroenteritis I recommended a BRAT diet. I recommended Phenergan 25 mg every 6 hours as needed for nausea or vomiting. I recommended Imodium over-the-counter for diarrhea. I recommended tincture of time. I anticipate spontaneous resolution in 48-72 hours.  I recommended he push fluids until then. - promethazine (PHENERGAN) 25 MG tablet; Take 1 tablet (25 mg total) by mouth every 6 (six) hours as needed for nausea or vomiting.  Dispense: 20 tablet; Refill: 0

## 2013-11-05 ENCOUNTER — Ambulatory Visit (INDEPENDENT_AMBULATORY_CARE_PROVIDER_SITE_OTHER): Payer: Medicaid Other | Admitting: Family Medicine

## 2013-11-05 ENCOUNTER — Encounter: Payer: Self-pay | Admitting: Family Medicine

## 2013-11-05 VITALS — BP 138/76 | HR 80 | Temp 98.1°F | Resp 20 | Ht 72.0 in | Wt 167.5 lb

## 2013-11-05 DIAGNOSIS — M25579 Pain in unspecified ankle and joints of unspecified foot: Secondary | ICD-10-CM

## 2013-11-05 DIAGNOSIS — M25572 Pain in left ankle and joints of left foot: Secondary | ICD-10-CM

## 2013-11-05 DIAGNOSIS — F411 Generalized anxiety disorder: Secondary | ICD-10-CM

## 2013-11-05 DIAGNOSIS — F17200 Nicotine dependence, unspecified, uncomplicated: Secondary | ICD-10-CM

## 2013-11-05 DIAGNOSIS — F419 Anxiety disorder, unspecified: Secondary | ICD-10-CM

## 2013-11-05 DIAGNOSIS — F429 Obsessive-compulsive disorder, unspecified: Secondary | ICD-10-CM

## 2013-11-05 MED ORDER — NICOTINE 21 MG/24HR TD PT24: 21.0000 mg | MEDICATED_PATCH | Freq: Every day | TRANSDERMAL | Status: DC

## 2013-11-05 MED ORDER — BUSPIRONE HCL 10 MG PO TABS: 10.0000 mg | ORAL_TABLET | Freq: Two times a day (BID) | ORAL | Status: DC

## 2013-11-05 NOTE — Patient Instructions (Addendum)
Continue current medications Eat during your night shift, use protein shake or supplement of more fruit and veggies  You need to get in 3 meals a day Try the nicotine patch once a day Buspar increased to twice a day F/U 2 months for medications

## 2013-11-05 NOTE — Assessment & Plan Note (Signed)
Per above medication changes 

## 2013-11-05 NOTE — Assessment & Plan Note (Signed)
Counseled on cessation we'll try him on a nicotine patches

## 2013-11-05 NOTE — Assessment & Plan Note (Signed)
Continue his Lexapro I will change him to BuSpar 10 mg twice a day with the importance of taking the BuSpar twice a day as prescribed he will followup in 2 months for recheck

## 2013-11-05 NOTE — Progress Notes (Signed)
Patient ID: Christopher Wyatt, male   DOB: 03-17-1976, 38 y.o.   MRN: [id number]   Subjective:    Patient ID: Christopher Wyatt, male    DOB: 24-Aug-1976, 38 y.o.   MRN: [id number]  Patient presents for ankle pain  patient is here with a few concerns last night he tripped off of the back of his porch over his dog and twisted his left ankle. He did have some swelling and pain initially but it is now resolved and he is back at baseline. He would like to have a note releasing him back to work as he missed last night shift. He is also concerned about his anxiety medication. He is currently a Lexapro 20 mg he was on BuSpar 7.5 twice a day he thinks that he was only taking it once a day but did not think it was strong enough. He still feels on edge with his anxiety and will like to restart the medication. Tobacco use-he is currently smoking a pack per day sometimes he dips snuff, he's tried Chantix but had side effects with weird dreams and increased anxiety he is also been on Wellbutrin in the past and did not do well with this per report. He did try the paper cigarette or it did not help    Review Of Systems:  GEN- denies fatigue, fever, weight loss,weakness, recent illness CVS- denies chest pain, palpitations RESP- denies SOB, cough, wheeze MSK- denies joint pain, muscle aches, injury Neuro- denies headache, dizziness, syncope, seizure activity       Objective:    BP 138/76  Pulse 80  Temp(Src) 98.1 F (36.7 C)  Resp 20  Ht 6' (1.829 m)  Wt 167 lb 8 oz (75.978 kg)  BMI 22.71 kg/m2 GEN- NAD, alert and oriented x3 Psych- normal affect and mood, well groomed, good eye contact MSK- normal appearance left ankle, no effusion, FROM, strength normal, ligaments in tact Pulses-, DP- 2+        Assessment & Plan:      Problem List Items Addressed This Visit   None      Note: This dictation was prepared with Dragon dictation along with smaller phrase technology. Any transcriptional  errors that result from this process are unintentional.

## 2013-12-16 ENCOUNTER — Encounter: Payer: Self-pay | Admitting: Physician Assistant

## 2013-12-16 ENCOUNTER — Ambulatory Visit (INDEPENDENT_AMBULATORY_CARE_PROVIDER_SITE_OTHER): Payer: Medicaid Other | Admitting: Physician Assistant

## 2013-12-16 VITALS — BP 96/68 | HR 68 | Temp 98.1°F | Resp 18 | Ht 70.5 in | Wt 166.0 lb

## 2013-12-16 DIAGNOSIS — H8110 Benign paroxysmal vertigo, unspecified ear: Secondary | ICD-10-CM

## 2013-12-16 NOTE — Progress Notes (Signed)
Patient ID: Christopher Wyatt MRN: [id number], DOB: 1976/01/27, 38 y.o. Date of Encounter: 12/16/2013, 10:03 AM    Chief Complaint:  Chief Complaint  Patient presents with  . swimmy feeling right ear/dizziness    x 2 days     HPI: 38 y.o. year old male here with above complaint.  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 Works third shift. Says that he was at work when the symptoms started at about 1:00 AM yesterday morning. Says that he would be driving the equipment and then had to stick his head out the window to look at the trailer says that when he would look at the trailer it "looked like it was moving and   swaying back and forth. When he would walk he was feeling as if he was drunk and staggery. His boss noticed this and told him that he needed to go home and needed to followup with his PCP. He called here for an appointment yesterday morning at 8:00 but was told that we had no appointments. This appointment was the first appointment available for him to be seen. Therefore he is here for visits because he needs to report back to his employer that he was seen here. He says that  he was sent home from work at about 4 a.m. yesterday morning. Says that he went home and took a nap and woke up around noon time. Says that when he woke up he felt much better. Says that today he is feeling completely normal without any symptoms. Is having no spinning sensation and is not feeling drunk or staggering when he walks. He reports that he has never had episodes similar to this in the past that have required medical evaluation. He reports that he has had no recent viral symptoms including any nasal congestion cough or fever. He reports he has had no other neurologic changes including any weakness in any extremities, slurred speech, vision changes.   Home Meds: See attached medication section for any medications that were  entered at today's visit. The computer does not put those onto this list.The following list is a list of meds entered prior to today's visit.   Current Outpatient Prescriptions on File Prior to Visit  Medication Sig Dispense Refill  . busPIRone (BUSPAR) 10 MG tablet Take 1 tablet (10 mg total) by mouth 2 (two) times daily.  60 tablet  3  . escitalopram (LEXAPRO) 20 MG tablet Take 1 tablet (20 mg total) by mouth daily.  30 tablet  11  . nicotine (NICODERM CQ) 21 mg/24hr patch Place 1 patch (21 mg total) onto the skin daily.  28 patch  0   No current facility-administered medications on file prior to visit.    Allergies:  Allergies  Allergen Reactions  . Paroxetine Hcl     Erectile dysfunction      Review of Systems: See HPI for pertinent ROS. All other ROS negative.    Physical Exam: Blood pressure 96/68, pulse 68, temperature 98.1 F (36.7 C), temperature source Oral, resp. rate 18, height 5' 10.5" (1.791 m), weight 166 lb (75.297 kg)., Body mass index is 23.47 kg/(m^2). General: WNWD WM.  Appears in no acute distress. HEENT: Normocephalic, atraumatic, eyes without discharge, sclera non-icteric, nares are without discharge. Bilateral auditory canals clear, TM's are without perforation, pearly grey and translucent with reflective cone of light bilaterally. Oral cavity moist,  posterior pharynx without exudate, erythema, peritonsillar abscess.  Neck: Supple. No thyromegaly. No lymphadenopathy. Lungs: Clear bilaterally to auscultation without wheezes, rales, or rhonchi. Breathing is unlabored. Heart: Regular rhythm. No murmurs, rubs, or gallops. Msk:  Strength and tone normal for age. [place]: negative. Extremities/Skin: Warm and dry. Neuro: Alert and oriented X 3. Moves all extremities spontaneously. Gait is normal. CNII-XII grossly in tact.Romberg is normal.  Psych:  Responds to questions appropriately with a normal affect.     ASSESSMENT AND PLAN:  38 y.o. year old male  with  1. Benign paroxysmal positional vertigo His symptoms have resolved so needs no treatment at this time. We have given him a note to turn into work to state that he was out 12/15/13 and 12/16/13. Stable to return to work on 12/16/13 at 11 PM which is when his shift starts today. F/U With [place] if has recurrence of symptoms in the future.   Signed, [address] [place], [place], [facility] 12/16/2013 10:03 AM

## 2014-01-27 ENCOUNTER — Ambulatory Visit: Payer: Medicaid Other | Admitting: Physician Assistant

## 2014-01-27 ENCOUNTER — Ambulatory Visit (INDEPENDENT_AMBULATORY_CARE_PROVIDER_SITE_OTHER): Payer: Medicaid Other | Admitting: Physician Assistant

## 2014-01-27 ENCOUNTER — Encounter: Payer: Self-pay | Admitting: Family Medicine

## 2014-01-27 ENCOUNTER — Encounter: Payer: Self-pay | Admitting: Physician Assistant

## 2014-01-27 VITALS — BP 106/74 | HR 76 | Temp 98.0°F | Resp 18 | Wt 160.0 lb

## 2014-01-27 DIAGNOSIS — N200 Calculus of kidney: Secondary | ICD-10-CM

## 2014-01-27 MED ORDER — OXYCODONE-ACETAMINOPHEN 5-325 MG PO TABS: 1.0000 | ORAL_TABLET | Freq: Four times a day (QID) | ORAL | Status: DC | PRN

## 2014-01-27 NOTE — Progress Notes (Signed)
Patient ID: Christopher Wyatt MRN: [id number], DOB: 28-Oct-1975, 38 y.o. Date of Encounter: 01/27/2014, 3:10 PM    Chief Complaint:  Chief Complaint  Patient presents with  . c/o kidney stone    states just passed on a few weeks ago     HPI: 38 y.o. year old white male says that he had his first kidney stone in 2006. Says at that time he was given a strainer to use for future kidney stones.  He had his second kidney stone about 3 weeks ago. He did use the strainer and was able to collect the stone with the strainer.  Says that he is now having the exact same type of pain he had 3 weeks ago. Says this episode started Tuesday night 01/25/14. Says that Tuesday night he had severe pain in his right back. Says that now the pain has traveled to his right flank.  Says this will be his third kidney stone that he's had it. Says the one in 2006 passed within about an 8 hour period.  Says that with the one he had 3 weeks ago -- once the pain got to his flank area that it only took a few more hours and then it  passed.  He is requesting a " few pain pills to use just to get him over these next few hours". Says that his fiancee had some Vicodin from where she had a problem with her hip. Says that he used that and that  worked well at relieving his pain.  Says he just went to the bathroom prior to coming here and cannot leave a urine sample. Says he has seen no gross hematuria.  Also is requesting a note for out of work. Says that he was out of work last night and will need to be out of work again [employer].     Home Meds:   Outpatient Prescriptions Prior to Visit  Medication Sig Dispense Refill  . busPIRone (BUSPAR) 10 MG tablet Take 1 tablet (10 mg total) by mouth 2 (two) times daily.  60 tablet  3  . escitalopram (LEXAPRO) 20 MG tablet Take 1 tablet (20 mg total) by mouth daily.  30 tablet  11  . nicotine (NICODERM CQ) 21 mg/24hr patch Place 1 patch (21 mg total) onto the skin daily.  28  patch  0   No facility-administered medications prior to visit.    Allergies:  Allergies  Allergen Reactions  . Paroxetine Hcl     Erectile dysfunction      Review of Systems: See HPI for pertinent ROS. All other ROS negative.    Physical Exam: Blood pressure 106/74, pulse 76, temperature 98 F (36.7 C), temperature source Oral, resp. rate 18, weight 160 lb (72.576 kg)., Body mass index is 22.63 kg/(m^2). General: WNWD WM.  Appears in no acute distress. Neck: Supple. No thyromegaly. No lymphadenopathy. Lungs: Clear bilaterally to auscultation without wheezes, rales, or rhonchi. Breathing is unlabored. Heart: Regular rhythm. No murmurs, rubs, or gallops. Abdomen: Soft, non-tender, non-distended with normoactive bowel sounds. No hepatomegaly. No rebound/guarding. No obvious abdominal masses. Msk:  Strength and tone normal for age. Back: No pain with percussion of costophrenic angles bilaterally.  Positive tenderness right flank.  Extremities/Skin: Warm and dry. Neuro: Alert and oriented X 3. Moves all extremities spontaneously. Gait is normal. CNII-XII grossly in tact. Psych:  Responds to questions appropriately with a normal affect.     ASSESSMENT AND PLAN:  38 y.o. year old  male with  1. Nephrolithiasis As note for out of work night to 01/26/14 as well as 01/27/14. I offered to give a note to also cover the night of 01/28/14. However he says that he will have to get in some work hours on this paycheck and defers further work notes. Given the following prescription for pain medication. He is aware not to take this prior to driving or going to work. Told him to strain his renin and collect any stone. He can bring it in for lab analysis. If we can determine the type of stone, then  may be able to use  treatment to prevent recurrence in the future. - oxyCODONE-acetaminophen (ROXICET) 5-325 MG per tablet; Take 1 tablet by mouth every 6 (six) hours as needed for severe pain.  Dispense:  15 tablet; Refill: 0   Signed, [address] [place], [place], [facility] 01/27/2014 3:10 PM

## 2014-04-27 ENCOUNTER — Encounter (HOSPITAL_COMMUNITY): Payer: Self-pay | Admitting: Emergency Medicine

## 2014-04-27 ENCOUNTER — Emergency Department (HOSPITAL_COMMUNITY): Payer: Medicaid Other

## 2014-04-27 ENCOUNTER — Emergency Department (HOSPITAL_COMMUNITY)
Admission: EM | Admit: 2014-04-27 | Discharge: 2014-04-27 | Disposition: A | Discharge status: Home / Self Care | Payer: Medicaid Other | Attending: Emergency Medicine | Admitting: Emergency Medicine

## 2014-04-27 DIAGNOSIS — N342 Other urethritis: Secondary | ICD-10-CM | POA: Insufficient documentation

## 2014-04-27 DIAGNOSIS — IMO0002 Reserved for concepts with insufficient information to code with codable children: Secondary | ICD-10-CM | POA: Diagnosis present

## 2014-04-27 DIAGNOSIS — Z8659 Personal history of other mental and behavioral disorders: Secondary | ICD-10-CM | POA: Diagnosis not present

## 2014-04-27 DIAGNOSIS — W010XXA Fall on same level from slipping, tripping and stumbling without subsequent striking against object, initial encounter: Secondary | ICD-10-CM | POA: Diagnosis not present

## 2014-04-27 DIAGNOSIS — S22000A Wedge compression fracture of unspecified thoracic vertebra, initial encounter for closed fracture: Secondary | ICD-10-CM

## 2014-04-27 DIAGNOSIS — Y9289 Other specified places as the place of occurrence of the external cause: Secondary | ICD-10-CM | POA: Insufficient documentation

## 2014-04-27 DIAGNOSIS — S22009A Unspecified fracture of unspecified thoracic vertebra, initial encounter for closed fracture: Secondary | ICD-10-CM | POA: Diagnosis not present

## 2014-04-27 DIAGNOSIS — F17200 Nicotine dependence, unspecified, uncomplicated: Secondary | ICD-10-CM | POA: Diagnosis not present

## 2014-04-27 DIAGNOSIS — Y9301 Activity, walking, marching and hiking: Secondary | ICD-10-CM | POA: Insufficient documentation

## 2014-04-27 LAB — URINALYSIS, ROUTINE W REFLEX MICROSCOPIC
Bilirubin Urine: NEGATIVE
Glucose, UA: NEGATIVE mg/dL
Hgb urine dipstick: NEGATIVE
Ketones, ur: NEGATIVE mg/dL
Nitrite: NEGATIVE
Protein, ur: NEGATIVE mg/dL
Specific Gravity, Urine: 1.024 (ref 1.005–1.030)
Urobilinogen, UA: 0.2 mg/dL (ref 0.0–1.0)
pH: 6 (ref 5.0–8.0)

## 2014-04-27 LAB — URINE MICROSCOPIC-ADD ON

## 2014-04-27 MED ORDER — LIDOCAINE HCL (PF) 1 % IJ SOLN
0.9000 mL | Freq: Once | INTRAMUSCULAR | Status: DC
  Filled 2014-04-27: qty 5

## 2014-04-27 MED ORDER — HYDROCODONE-ACETAMINOPHEN 5-325 MG PO TABS: 2.0000 | ORAL_TABLET | ORAL | Status: DC | PRN

## 2014-04-27 MED ORDER — IBUPROFEN 800 MG PO TABS
800.0000 mg | ORAL_TABLET | Freq: Once | ORAL | Status: AC
  Administered 2014-04-27: 800 mg via ORAL
  Filled 2014-04-27: qty 1

## 2014-04-27 MED ORDER — CEFTRIAXONE SODIUM 250 MG IJ SOLR
250.0000 mg | Freq: Once | INTRAMUSCULAR | Status: DC
  Filled 2014-04-27: qty 250

## 2014-04-27 MED ORDER — AZITHROMYCIN 250 MG PO TABS
1000.0000 mg | ORAL_TABLET | Freq: Once | ORAL | Status: AC
  Administered 2014-04-27: 1000 mg via ORAL
  Filled 2014-04-27: qty 4

## 2014-04-27 NOTE — Discharge Instructions (Signed)
Back, Compression Fracture °A compression fracture happens when a force is put upon the length of your spine. Slipping and falling on your bottom are examples of such a force. When this happens, sometimes the force is great enough to compress the building blocks (vertebral bodies) of your spine. Although this causes a lot of pain, this can usually be treated at home, unless your caregiver feels hospitalization is needed for pain control. °Your backbone (spinal column) is made up of 24 main vertebral bodies in addition to the sacrum and coccyx (see illustration). These are held together by tough fibrous tissues (ligaments) and by support of your muscles. Nerve roots pass through the openings between the vertebrae. A sudden wrenching move, injury, or a fall may cause a compression fracture of one of the vertebral bodies. This may result in back pain or spread of pain into the belly (abdomen), the buttocks, and down the leg into the foot. Pain may also be created by muscle spasm alone. °Large studies have been undertaken to determine the best possible course of action to help your back following injury and also to prevent future problems. The recommendations are as follows. °FOLLOWING A COMPRESSION FRACTURE: °Do the following only if advised by your caregiver.  °· If a back brace has been suggested or provided, wear it as directed. °· Do not stop wearing the back brace unless instructed by your caregiver. °· When allowed to return to regular activities, avoid a sedentary lifestyle. Actively exercise. Sporadic weekend binges of tennis, racquetball, or waterskiing may actually aggravate or create problems, especially if you are not in condition for that activity. °· Avoid sports requiring sudden body movements until you are in condition for them. Swimming and walking are safer activities. °· Maintain good posture. °· Avoid obesity. °· If not already done, you should have a DEXA scan. Based on the results, be treated for  osteoporosis. °FOLLOWING ACUTE (SUDDEN) INJURY: °· Only take over-the-counter or prescription medicines for pain, discomfort, or fever as directed by your caregiver. °· Use bed rest for only the most extreme acute episode. Prolonged bed rest may aggravate your condition. Ice used for acute conditions is effective. Use a large plastic bag filled with ice. Wrap it in a towel. This also provides excellent pain relief. This may be continuous. Or use it for 30 minutes every 2 hours during acute phase, then as needed. Heat for 30 minutes prior to activities is helpful. °· As soon as the acute phase (the time when your back is too painful for you to do normal activities) is over, it is important to resume normal activities and work hardening programs. Back injuries can cause potentially marked changes in lifestyle. So it is important to attack these problems aggressively. °· See your caregiver for continued problems. He or she can help or refer you for appropriate exercises, physical therapy, and work hardening if needed. °· If you are given narcotic medications for your condition, for the next 24 hours do not: °¨ Drive. °¨ Operate machinery or power tools. °¨ Sign legal documents. °· Do not drink alcohol, or take sleeping pills or other medications that may interfere with treatment. °If your caregiver has given you a follow-up appointment, it is very important to keep that appointment. Not keeping the appointment could result in a chronic or permanent injury, pain, and disability. If there is any problem keeping the appointment, you must call back to this facility for assistance.  °SEEK IMMEDIATE MEDICAL CARE IF: °· You develop numbness,   tingling, weakness, or problems with the use of your arms or legs. °· You develop severe back pain not relieved with medications. °· You have changes in bowel or bladder control. °· You have increasing pain in any areas of the body. °Document Released: 08/26/2005 Document Revised:  01/10/2014 Document Reviewed: 03/30/2008 °ExitCare® Patient Information ©2015 [employer]. This information is not intended to replace advice given to you by your health care provider. Make sure you discuss any questions you have with your health care provider. ° °

## 2014-04-27 NOTE — ED Notes (Signed)
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  He said the "switcher" that he was working on was damaged and he had told personnel about it.  The patient said he was at work and he started having severe pain so they sent him to be evaluated.

## 2014-04-27 NOTE — ED Provider Notes (Signed)
CSN: [id number]     Arrival date & time 04/27/14  0159 History   First MD Initiated Contact with Patient 04/27/14 [phone]     Chief Complaint  Patient presents with  . 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  He said the "switcher" that he was working on was damaged and he had told personnel about it.       (Consider location/radiation/quality/duration/timing/severity/associated sxs/prior Treatment) HPI Comments: Patient presents with back pain after a fall the back of a truck 24 hours ago. He states he was working when he slipped on the catwalk and fell backwards onto his mid and low back the left side. Denies hitting head or losing consciousness. Denies any chest pain or shortness of breath. Denies any neck pain. Denies any focal weakness, numbness tingling. No bowel or bladder incontinence. No previous history of back problems. Took ibuprofen with some relief. Denies any abdominal pain denies any testicular pain.  The history is provided by the patient.    Past Medical History  Diagnosis Date  . OCD (obsessive compulsive disorder)   . Anxiety     Chronic   History reviewed. No pertinent past surgical history. History reviewed. No pertinent family history. History  Substance Use Topics  . Smoking status: Current Every Day Smoker -- 1.00 packs/day    Types: Cigarettes  . Smokeless tobacco: Current User    Types: Snuff  . Alcohol Use: Yes    Review of Systems  Constitutional: Negative for fever, activity change and appetite change.  HENT: Negative for congestion and rhinorrhea.   Respiratory: Negative for cough, chest tightness and shortness of breath.   Cardiovascular: Negative for chest pain and palpitations.  Gastrointestinal: Negative for nausea, vomiting and abdominal pain.  Genitourinary: Negative for dysuria and hematuria.  Musculoskeletal: Positive for arthralgias, back pain and myalgias.  Skin: Negative for rash.  Neurological:  Negative for dizziness, weakness and headaches.  A complete 10 system review of systems was obtained and all systems are negative except as noted in the HPI and PMH.      Allergies  Paroxetine hcl  Home Medications   Prior to Admission medications   Medication Sig Start Date End Date Taking? Authorizing Provider  ibuprofen (ADVIL,MOTRIN) 200 MG tablet Take 400 mg by mouth every 6 (six) hours as needed for moderate pain.   Yes Historical Provider, MD  HYDROcodone-acetaminophen (NORCO/VICODIN) 5-325 MG per tablet Take 2 tablets by mouth every 4 (four) hours as needed. 04/27/14   Ezequiel Essex, MD   BP 115/82  Pulse 76  Temp(Src) 98 F (36.7 C) (Oral)  Resp 14  SpO2 99% Physical Exam  Nursing note and vitals reviewed. Constitutional: He is oriented to person, place, and time. He appears well-developed and well-nourished. No distress.  HENT:  Head: Normocephalic and atraumatic.  Mouth/Throat: Oropharynx is clear and moist. No oropharyngeal exudate.  Eyes: Conjunctivae and EOM are normal. Pupils are equal, round, and reactive to light.  Neck: Normal range of motion. Neck supple.  No C spine pain.  Cardiovascular: Normal rate, regular rhythm, normal heart sounds and intact distal pulses.   No murmur heard. Pulmonary/Chest: Effort normal and breath sounds normal. No respiratory distress.  Abdominal: Soft. There is no tenderness. There is no rebound and no guarding.  No testicular pain  Musculoskeletal: Normal range of motion. He exhibits tenderness. He exhibits no edema.  TTP thoracic and lumbar spine. L paraspinal tenderness 5/5 strength  in bilateral lower extremities. Ankle plantar and dorsiflexion intact. Great toe extension intact bilaterally. +2 DP and PT pulses. +2 patellar reflexes bilaterally. Normal gait.   Neurological: He is alert and oriented to person, place, and time. No cranial nerve deficit. He exhibits normal muscle tone. Coordination normal.  No ataxia on finger  to nose bilaterally. No pronator drift. 5/5 strength throughout. CN 2-12 intact. Negative Romberg. Equal grip strength. Sensation intact. Gait is normal.   Skin: Skin is warm.  No ecchymosis  Psychiatric: He has a normal mood and affect. His behavior is normal.    ED Course  Procedures (including critical care time) Labs Review Labs Reviewed  URINALYSIS, ROUTINE W REFLEX MICROSCOPIC - Abnormal; Notable for the following:    APPearance HAZY (*)    Leukocytes, UA SMALL (*)    All other components within normal limits  URINE MICROSCOPIC-ADD ON - Abnormal; Notable for the following:    Bacteria, UA FEW (*)    All other components within normal limits    Imaging Review Dg Thoracic Spine 2 View  04/27/2014   CLINICAL DATA:  Fall from truck.  EXAM: THORACIC SPINE - 2 VIEW  COMPARISON:  None.  FINDINGS: There is no evidence of thoracic spine fracture. Mild broad dextroscoliosis could be positional. Alignment is normal. No other significant bone abnormalities are identified.  IMPRESSION: No acute fracture deformity or malalignment.   Electronically Signed   By: Elon Alas   On: 04/27/2014 06:10   Dg Lumbar Spine Complete  04/27/2014   CLINICAL DATA:  Fall from truck.  EXAM: LUMBAR SPINE - COMPLETE 4+ VIEW  COMPARISON:  None.  FINDINGS: Slight cortical irregularity of the T12 anterior superior endplate better seen on this examination than on the dedicated thoracic spine series. Five non rib-bearing lumbar-type vertebral bodies are intact and aligned with maintenance of the lumbar lordosis. Intervertebral disc heights are normal. No pars interarticularis defects. No destructive bony lesions.  Sacroiliac joints are symmetric. Included prevertebral and paraspinal soft tissue planes are non-suspicious.  IMPRESSION: Slight cortical irregularity of the T12 anterior superior endplate could reflect acute injury.  No acute lumbar spine fracture deformity or malalignment.   Electronically Signed   By:  Elon Alas   On: 04/27/2014 06:12     EKG Interpretation None      MDM   Final diagnoses:  Thoracic compression fracture, closed, initial encounter  Urethritis  Fall from 3-4 feet with back injury.  Did not hit head or LOC.  No focal neuro deficits.  TTP Thoracic and lumbar spine. No abdominal pain.  Xrays show possible T12 end plate fracture. UA without hematuria.  Will treat possible urethritis.  Treat for possible compression fracture with pain control.  Neuro intact and able to ambulate. Tolerating PO. No flank hematoma.  Follow up with PCP.   BP 115/82  Pulse 76  Temp(Src) 98 F (36.7 C) (Oral)  Resp 14  SpO2 99%   Ezequiel Essex, MD 04/27/14 2047

## 2014-10-20 ENCOUNTER — Telehealth: Payer: Self-pay | Admitting: Family Medicine

## 2014-10-20 NOTE — Telephone Encounter (Signed)
-----   Message from Susy Frizzle, MD sent at 10/20/2014  7:08 AM EST ----- Regarding: RE: Discharged PT Contact: [phone] I am ok with him being a patient here again but notify patient if he DNKA's again he will be dismissed. ----- Message -----    From: Lenore Manner    Sent: 10/19/2014  11:01 AM      To: Susy Frizzle, MD Subject: Discharged PT                                  PT called and paid his bad debt balance. And he is wanting to become a pt here again. But in the past he has many many no showed apts as well as canceled apts. Do you want to take him back as a pt here at the office.

## 2014-11-09 ENCOUNTER — Ambulatory Visit (INDEPENDENT_AMBULATORY_CARE_PROVIDER_SITE_OTHER): Payer: Medicaid Other | Admitting: Family Medicine

## 2014-11-09 ENCOUNTER — Encounter: Payer: Self-pay | Admitting: Family Medicine

## 2014-11-09 VITALS — BP 142/78 | HR 68 | Temp 98.6°F | Resp 16 | Ht 71.0 in | Wt 170.0 lb

## 2014-11-09 DIAGNOSIS — Z720 Tobacco use: Secondary | ICD-10-CM

## 2014-11-09 DIAGNOSIS — F419 Anxiety disorder, unspecified: Secondary | ICD-10-CM

## 2014-11-09 DIAGNOSIS — F17200 Nicotine dependence, unspecified, uncomplicated: Secondary | ICD-10-CM

## 2014-11-09 MED ORDER — CLONAZEPAM 1 MG PO TABS: 1.0000 mg | ORAL_TABLET | Freq: Two times a day (BID) | ORAL | Status: DC | PRN

## 2014-11-09 NOTE — Assessment & Plan Note (Signed)
Long-term history of anxiety he has been on a few medications in the past and would typically have side effects suspicious sexual therefore he would discontinue them. He is currently on well be transferred tobacco cessation he is also on the Klonopin. I will go ahead and increase it so that he can take it twice a day as needed I given him 45 tablets.

## 2014-11-09 NOTE — Progress Notes (Signed)
Patient ID: KHRIZ LIDDY, Christopher Wyatt   DOB: 08-11-1976, 39 y.o.   MRN: [id number]   Subjective:    Patient ID: Christopher Wyatt, Christopher Wyatt    DOB: 26-Nov-1975, 39 y.o.   MRN: [id number]  Patient presents for Medication Review/ Refill  Patient here to discuss his medication. He was prescribed clonazepam by a physician at [facility] urgent care he was sent there because of some injuries when he was on the job. They noted his increased anxiety and started him on clonazepam. He's been treated by our office for anxiety in the past he was on a couple of different medications but would often has side effects the last he was on with Lexapro and BuSpar he was also placed on Xanax in the past but did not do well with this. He states that the clonazepam does help him he typically takes 1 in the morning occasionally he takes in the evening they're for the physician gave him 35 tablets. But the pharmacy would not fill this because of the directions. I did review the bottle and this was indeed truly was given 35 tablets with instructions of one by mouth daily. He is also on Wellbutrin he states that that physician also gave him that to help him quit smoking and is helped with his nerves and the smoking too   Review Of Systems:  GEN- denies fatigue, fever, weight loss,weakness, recent illness HEENT- denies eye drainage, change in vision, nasal discharge, CVS- denies chest pain, palpitations RESP- denies SOB, cough, wheeze ABD- denies N/V, change in stools, abd pain GU- denies dysuria, hematuria, dribbling, incontinence MSK- denies joint pain, muscle aches, injury Neuro- denies headache, dizziness, syncope, seizure activity       Objective:    BP 142/78 mmHg  Pulse 68  Temp(Src) 98.6 F (37 C) (Oral)  Resp 16  Ht 5\' 11"  (1.803 m)  Wt 170 lb (77.111 kg)  BMI 23.72 kg/m2 GEN- NAD, alert and oriented x3 Psych- normal affect and mood, no SI, well groomed        Assessment & Plan:      Problem  List Items Addressed This Visit      Unprioritized   Tobacco use disorder   Anxiety - Primary    Long-term history of anxiety he has been on a few medications in the past and would typically have side effects suspicious sexual therefore he would discontinue them. He is currently on well be transferred tobacco cessation he is also on the Klonopin. I will go ahead and increase it so that he can take it twice a day as needed I given him 45 tablets.      Relevant Medications   buPROPion (WELLBUTRIN SR) 150 MG 12 hr tablet      Note: This dictation was prepared with Dragon dictation along with smaller phrase technology. Any transcriptional errors that result from this process are unintentional.

## 2014-11-09 NOTE — Patient Instructions (Signed)
Continue wellbutrin Continue the klonopin, new prescription  F/U 3 months

## 2015-01-11 ENCOUNTER — Encounter: Payer: Self-pay | Admitting: Family Medicine

## 2015-01-11 ENCOUNTER — Ambulatory Visit (INDEPENDENT_AMBULATORY_CARE_PROVIDER_SITE_OTHER): Payer: Medicaid Other | Admitting: Family Medicine

## 2015-01-11 VITALS — BP 128/76 | HR 76 | Temp 98.6°F | Resp 18 | Ht 71.0 in | Wt 169.0 lb

## 2015-01-11 DIAGNOSIS — F42 Obsessive-compulsive disorder: Secondary | ICD-10-CM | POA: Diagnosis not present

## 2015-01-11 DIAGNOSIS — F419 Anxiety disorder, unspecified: Secondary | ICD-10-CM | POA: Diagnosis not present

## 2015-01-11 DIAGNOSIS — F429 Obsessive-compulsive disorder, unspecified: Secondary | ICD-10-CM

## 2015-01-11 DIAGNOSIS — Z720 Tobacco use: Secondary | ICD-10-CM | POA: Diagnosis not present

## 2015-01-11 DIAGNOSIS — F17200 Nicotine dependence, unspecified, uncomplicated: Secondary | ICD-10-CM

## 2015-01-11 MED ORDER — BUPROPION HCL ER (SR) 150 MG PO TB12: 150.0000 mg | ORAL_TABLET | Freq: Two times a day (BID) | ORAL | Status: DC

## 2015-01-11 MED ORDER — CLONAZEPAM 1 MG PO TABS: 1.0000 mg | ORAL_TABLET | Freq: Two times a day (BID) | ORAL | Status: DC | PRN

## 2015-01-11 NOTE — Patient Instructions (Signed)
Continue wellbutrin and klonopin Increase calories 2500 - Cancel the appointment for June F/u 3 MONTHS for physical

## 2015-01-11 NOTE — Assessment & Plan Note (Signed)
He will continue the  Wellbutrin and the Klonopin. I tried to give him reassurance about his weight which is normal. He will return in a few months we will have a physical done with fasting labs

## 2015-01-11 NOTE — Progress Notes (Signed)
Patient ID: Christopher Wyatt, male   DOB: August 13, 1976, 39 y.o.   MRN: [id number]   Subjective:    Patient ID: Christopher Wyatt, male    DOB: 04-17-1976, 39 y.o.   MRN: [id number]  Patient presents for Medication Refills and Appetite Loss  here to follow-up his medications. He will run out of well be trimmed today. The medicine has really helped both his mood and with his smoking. He is now down to 18 cigarettes a day previously at a pack and a half a day. He can tell a difference with the meds. He is also still using Klonopin as prescribed. He still is concerned that he has not gained a lot of weight his weight is actually normal but he wants more weight in his gluteal region. He states at times he just doesn't have appetite and he is also very picky about his feet. He then went on to say though that he will snack on junk food before meal.  Review Of Systems:  GEN- denies fatigue, fever, weight loss,weakness, recent illness HEENT- denies eye drainage, change in vision, nasal discharge, CVS- denies chest pain, palpitations RESP- denies SOB, cough, wheeze ABD- denies N/V, change in stools, abd pain GU- denies dysuria, hematuria, dribbling, incontinence MSK- denies joint pain, muscle aches, injury Neuro- denies headache, dizziness, syncope, seizure activity       Objective:    BP 128/76 mmHg  Pulse 76  Temp(Src) 98.6 F (37 C) (Oral)  Resp 18  Ht 5\' 11"  (1.803 m)  Wt 169 lb (76.658 kg)  BMI 23.58 kg/m2 GEN- NAD, alert and oriented x3 pSYCH- Normal affect and mood, well groomed        Assessment & Plan:      Problem List Items Addressed This Visit    Tobacco use disorder   OCD (obsessive compulsive disorder)   Anxiety - Primary   Relevant Medications   buPROPion (WELLBUTRIN SR) 150 MG 12 hr tablet      Note: This dictation was prepared with Dragon dictation along with smaller phrase technology. Any transcriptional errors that result from this process are unintentional.

## 2015-01-11 NOTE — Assessment & Plan Note (Signed)
He is doing well cutting back significantly on his tobacco use. I'm sure will find that he'll eat more what to stop smoking so much

## 2015-01-27 ENCOUNTER — Ambulatory Visit (INDEPENDENT_AMBULATORY_CARE_PROVIDER_SITE_OTHER): Payer: Medicaid Other | Admitting: Family Medicine

## 2015-01-27 ENCOUNTER — Encounter: Payer: Self-pay | Admitting: Family Medicine

## 2015-01-27 VITALS — BP 128/78 | HR 78 | Temp 98.2°F | Resp 16 | Ht 71.0 in | Wt 171.0 lb

## 2015-01-27 DIAGNOSIS — F419 Anxiety disorder, unspecified: Secondary | ICD-10-CM

## 2015-01-27 MED ORDER — HYDROXYZINE HCL 25 MG PO TABS: 25.0000 mg | ORAL_TABLET | Freq: Two times a day (BID) | ORAL | Status: DC | PRN

## 2015-01-27 NOTE — Assessment & Plan Note (Signed)
Continue wellbutrin, given atarax 25mg  BID to use while incarcerated. He can resume Klonopin when he is not incarcerated, he has 22 more days to serve. Advised I do not have any jurisdiction over where they place him in jail. He voiced understanding. I did write a letter indicating his medication change

## 2015-01-27 NOTE — Patient Instructions (Signed)
To Whom This may Concern:     Christopher Wyatt is being treated for Anxiety, he is currently on Wellbutrin and Klonopin. I am aware of his Probation and he is unable to take Klonopin during his 2 day stay. I have prescribed Hydroxyzine which is not a scheduled drug but will help with his anxiety. Please note he has been compliant with his doctors visits and medications and no behavioral problems.    - Dr. Buelah Manis

## 2015-01-27 NOTE — Progress Notes (Signed)
Patient ID: Christopher Wyatt, male   DOB: 1976/07/10, 39 y.o.   MRN: [id number]   Subjective:    Patient ID: Christopher Wyatt, male    DOB: March 11, 1976, 39 y.o.   MRN: [id number]  Patient presents for Personal Issue  Pt here with very interesting request. He is currently serving a time in jail, every Monday and Tuesday due to insurance fraud. He is typically in General population but states after he "put money on [employer]" for another inmate they moved him to a regular cell stating his safety was a concern. Being in the smaller cell makes him very anxious and he had a bad panic attack and can not take his Klonopin while incarcerated.  He wanted a letter stating he can go back to general population.    Review Of Systems:  GEN- denies fatigue, fever, weight loss,weakness, recent illness HEENT- denies eye drainage, change in vision, nasal discharge, CVS- denies chest pain, palpitations RESP- denies SOB, cough, wheeze ABD- denies N/V, change in stools, abd pain GU- denies dysuria, hematuria, dribbling, incontinence MSK- denies joint pain, muscle aches, injury Neuro- denies headache, dizziness, syncope, seizure activity       Objective:    BP 128/78 mmHg  Pulse 78  Temp(Src) 98.2 F (36.8 C) (Oral)  Resp 16  Ht 5\' 11"  (1.803 m)  Wt 171 lb (77.565 kg)  BMI 23.86 kg/m2 GEN- NAD, alert and oriented x3 Psych- normal affect and mood, well groomed, normal speech       Assessment & Plan:      Problem List Items Addressed This Visit    Anxiety - Primary   Relevant Medications   hydrOXYzine (ATARAX/VISTARIL) 25 MG tablet      Note: This dictation was prepared with Dragon dictation along with smaller phrase technology. Any transcriptional errors that result from this process are unintentional.

## 2015-03-09 ENCOUNTER — Telehealth: Payer: Self-pay | Admitting: Family Medicine

## 2015-03-09 NOTE — Telephone Encounter (Signed)
Patient calling to get refill on his klonopin he said the pharmacy told him to call us  [place] [facility]  [phone]

## 2015-03-09 NOTE — Telephone Encounter (Signed)
Call placed to pharmacy to inquire.   Was advised that refill is in process and will be available for pick up this afternoon.   Patient made aware.

## 2015-04-14 ENCOUNTER — Ambulatory Visit: Payer: Medicaid Other | Admitting: Family Medicine

## 2015-04-24 ENCOUNTER — Ambulatory Visit: Payer: Medicaid Other | Admitting: Family Medicine

## 2015-05-10 ENCOUNTER — Ambulatory Visit: Payer: Medicaid Other | Admitting: Family Medicine

## 2015-05-12 ENCOUNTER — Encounter: Payer: Self-pay | Admitting: Family Medicine

## 2015-05-12 ENCOUNTER — Ambulatory Visit (INDEPENDENT_AMBULATORY_CARE_PROVIDER_SITE_OTHER): Payer: Medicaid Other | Admitting: Family Medicine

## 2015-05-12 VITALS — BP 132/68 | HR 78 | Temp 98.0°F | Resp 14 | Ht 71.0 in | Wt 167.0 lb

## 2015-05-12 DIAGNOSIS — F429 Obsessive-compulsive disorder, unspecified: Secondary | ICD-10-CM

## 2015-05-12 DIAGNOSIS — F419 Anxiety disorder, unspecified: Secondary | ICD-10-CM

## 2015-05-12 DIAGNOSIS — F17200 Nicotine dependence, unspecified, uncomplicated: Secondary | ICD-10-CM

## 2015-05-12 DIAGNOSIS — F42 Obsessive-compulsive disorder: Secondary | ICD-10-CM

## 2015-05-12 DIAGNOSIS — Z720 Tobacco use: Secondary | ICD-10-CM | POA: Diagnosis not present

## 2015-05-12 MED ORDER — CLONAZEPAM 1 MG PO TABS: 1.0000 mg | ORAL_TABLET | Freq: Two times a day (BID) | ORAL | Status: DC | PRN

## 2015-05-12 MED ORDER — BUPROPION HCL ER (SR) 150 MG PO TB12: 150.0000 mg | ORAL_TABLET | Freq: Two times a day (BID) | ORAL | Status: DC

## 2015-05-12 NOTE — Patient Instructions (Signed)
Restart the wellbutrin, Take 1 a day for 3 days, then go back to 1 twice a day  Use the klonopin only as needed- Take vistaril as needed  F/U 3 months- PHYSICAL

## 2015-05-12 NOTE — Assessment & Plan Note (Addendum)
I will have him restart the Wellbutrin with 1 tablet daily x 3 days, then 1 tab BID as  I think this helps his mood as well as with his tobacco cessation. He starting to wean down on the Klonopin use which I agree with. Advised him that he does not have to take every day. I know he will need it once he starts back his stenting he has 20 days left of jail to pull over the next few months therefore not to stop it completely at this time. We also discussed exercise in a regular basing getting proper sleep to help control his anxiety and stress. I also had a conversation with him again about him having a healthy weight currently that he does not eat any medications to help him gain weight. We will plan for a complete physical exam at her next visit along with fasting blood work

## 2015-05-12 NOTE — Progress Notes (Signed)
Patient ID: Christopher Wyatt, male   DOB: 11-Dec-1975, 39 y.o.   MRN: [id number]   Subjective:    Patient ID: Christopher Wyatt, male    DOB: 1976-08-28, 39 y.o.   MRN: [id number]  Patient presents for Medication Review  A Ok Edwards here to discuss his medications. He actually stopped as well be treated a few weeks ago he also states that he does not require his Klonopin every day he has not had to pull any time for this past month they gave him a month off from his sentence therefore he has not been very anxious has not had any panic attacks so he has been taking his Klonopin about every other day. They would not allow him to have the Vistaril while he was incarcerated on Mondays and Tuesdays therefore he has not taken this. He does think that the Wellbutrin helps with his smoking cessation once ago back on this. He is still concerned that he is not gaining weight he would like to be about 15 pounds heavier dose he is at a healthy weight for his height and body structure.    Review Of Systems:  GEN- denies fatigue, fever, weight loss,weakness, recent illness HEENT- denies eye drainage, change in vision, nasal discharge, CVS- denies chest pain, palpitations RESP- denies SOB, cough, wheeze ABD- denies N/V, change in stools, abd pain GU- denies dysuria, hematuria, dribbling, incontinence MSK- denies joint pain, muscle aches, injury Neuro- denies headache, dizziness, syncope, seizure activity       Objective:    BP 132/68 mmHg  Pulse 78  Temp(Src) 98 F (36.7 C) (Oral)  Resp 14  Ht 5\' 11"  (1.803 m)  Wt 167 lb (75.751 kg)  BMI 23.30 kg/m2 GEN- NAD, alert and oriented x3 HEENT- PERRL, EOMI, non injected sclera, pink conjunctiva, MMM, oropharynx clear CVS- RRR, no murmur RESP-CTAB Psych- Normal affect and mood, no SI, well groomed, no hallucinations          Assessment & Plan:      Problem List Items Addressed This Visit    None      Note: This dictation was prepared with  Dragon dictation along with smaller phrase technology. Any transcriptional errors that result from this process are unintentional.

## 2015-08-09 ENCOUNTER — Other Ambulatory Visit: Payer: Self-pay | Admitting: Family Medicine

## 2015-08-09 NOTE — Telephone Encounter (Signed)
Okay to refill? 

## 2015-08-09 NOTE — Telephone Encounter (Signed)
?   OK to Refill - Last RF 05/12/15 #45/1 - LOV 05/12/15

## 2015-08-10 NOTE — Telephone Encounter (Signed)
Medication called to pharmacy. 

## 2015-08-14 ENCOUNTER — Encounter: Payer: Self-pay | Admitting: Family Medicine

## 2015-08-14 ENCOUNTER — Ambulatory Visit (INDEPENDENT_AMBULATORY_CARE_PROVIDER_SITE_OTHER): Payer: Medicaid Other | Admitting: Family Medicine

## 2015-08-14 VITALS — BP 128/72 | HR 68 | Temp 98.6°F | Resp 14 | Ht 71.0 in | Wt 170.0 lb

## 2015-08-14 DIAGNOSIS — F429 Obsessive-compulsive disorder, unspecified: Secondary | ICD-10-CM | POA: Diagnosis not present

## 2015-08-14 DIAGNOSIS — F419 Anxiety disorder, unspecified: Secondary | ICD-10-CM

## 2015-08-14 NOTE — Patient Instructions (Signed)
Continue current medications F/U for your Physical
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
## 2015-08-14 NOTE — Assessment & Plan Note (Addendum)
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 I provided the latter stating that being a passenger in a car causes anxiety attacks. I would like to keep him working if at all possible.  Possibly 15 minutes spent with patient with greater 50% spent on history and counseling

## 2015-08-16 ENCOUNTER — Encounter: Payer: Medicaid Other | Admitting: Family Medicine

## 2015-10-09 ENCOUNTER — Other Ambulatory Visit: Payer: Self-pay | Admitting: Family Medicine

## 2015-10-09 NOTE — Telephone Encounter (Signed)
Okay to refill? 

## 2015-10-09 NOTE — Telephone Encounter (Signed)
Ok to refill??  Last office visit 12/5/52016.  Last refill 08/10/2015, #1 refill.

## 2015-10-09 NOTE — Telephone Encounter (Signed)
Medication called to pharmacy. 

## 2016-03-06 ENCOUNTER — Telehealth: Payer: Self-pay | Admitting: Family Medicine

## 2016-03-06 NOTE — Telephone Encounter (Signed)
Appointment scheduled.

## 2016-03-06 NOTE — Telephone Encounter (Signed)
Patient is calling to say that the klonopin is taking his appetite away and would like to speak to you about something he can do or take to help his appetite  [phone]

## 2016-03-06 NOTE — Telephone Encounter (Signed)
MD please advise

## 2016-03-06 NOTE — Telephone Encounter (Signed)
Make an appt

## 2016-03-08 ENCOUNTER — Other Ambulatory Visit: Payer: Self-pay | Admitting: Family Medicine

## 2016-03-11 ENCOUNTER — Ambulatory Visit: Payer: Self-pay | Admitting: Family Medicine

## 2016-03-11 NOTE — Telephone Encounter (Signed)
Ok to refill??  Last office visit 08/14/2015.  Last refill 10/09/2015, #1 refill.

## 2016-03-19 ENCOUNTER — Ambulatory Visit: Payer: Medicaid Other | Admitting: Family Medicine

## 2016-03-25 ENCOUNTER — Ambulatory Visit: Payer: Medicaid Other | Admitting: Family Medicine

## 2016-03-26 ENCOUNTER — Encounter: Payer: Self-pay | Admitting: Family Medicine

## 2016-03-26 ENCOUNTER — Other Ambulatory Visit: Payer: Self-pay | Admitting: Family Medicine

## 2016-03-26 ENCOUNTER — Ambulatory Visit (INDEPENDENT_AMBULATORY_CARE_PROVIDER_SITE_OTHER): Payer: Medicaid Other | Admitting: Family Medicine

## 2016-03-26 VITALS — BP 130/78 | HR 84 | Temp 98.8°F | Resp 16 | Ht 71.0 in | Wt 170.0 lb

## 2016-03-26 DIAGNOSIS — F419 Anxiety disorder, unspecified: Secondary | ICD-10-CM

## 2016-03-26 DIAGNOSIS — Z13220 Encounter for screening for lipoid disorders: Secondary | ICD-10-CM

## 2016-03-26 DIAGNOSIS — F429 Obsessive-compulsive disorder, unspecified: Secondary | ICD-10-CM | POA: Diagnosis not present

## 2016-03-26 DIAGNOSIS — Z1159 Encounter for screening for other viral diseases: Secondary | ICD-10-CM

## 2016-03-26 DIAGNOSIS — R630 Anorexia: Secondary | ICD-10-CM

## 2016-03-26 MED ORDER — MIRTAZAPINE 15 MG PO TABS: 15.0000 mg | ORAL_TABLET | Freq: Every day | ORAL | Status: DC

## 2016-03-26 MED ORDER — CLONAZEPAM 1 MG PO TABS: 1.0000 mg | ORAL_TABLET | Freq: Two times a day (BID) | ORAL | Status: DC | PRN

## 2016-03-26 NOTE — Progress Notes (Signed)
Patient ID: Christopher Wyatt, male   DOB: 06-24-1976, 40 y.o.   MRN: [id number]    Subjective:    Patient ID: Christopher Wyatt, male    DOB: 1976/05/23, 40 y.o.   MRN: [id number]  Patient presents for Medication Management  Patient here for follow-up. He is currently being treated for OCD and anxiety he is concerned about his weight. He is asked multiple times for medication help his weight though he's never been underweight. He Was prescribed Klonopin and will be transferred in for his anxiety and OCD. He has to stop the well future in which we also use to help him quit smoking back around April. He uses Klonopin very sparingly. He states he does not use it as much as he has not had to pull his weekend incarcerations. He is also working on a regular basis. He is concerned about his appetite he states that he feels sick when he eats he think is more of a mental pain he does not have any actual pain or discomfort no vomiting or change in his bowels but feels like he cannot eat during the weekday. He has tried drinking boost but that has not helped with his appetite. He will often drink [facility] ,sweet tea, lemonade and eat small meals at the end of the day. He states on the weekend he actually wakes up hungry and has a good appetite and will eat biscuits and gravy. He knows that he gets a little more anxious when he is working and has not wanted eat  It is described a couple instances where he would eat something really fast or drink something very quickly but has some discomfort into his right shoulder that will be brief.  Review Of Systems:  GEN- denies fatigue, fever, weight loss,weakness, recent illness HEENT- denies eye drainage, change in vision, nasal discharge, CVS- denies chest pain, palpitations RESP- denies SOB, cough, wheeze ABD- denies N/V, change in stools, abd pain GU- denies dysuria, hematuria, dribbling, incontinence MSK- denies joint pain, muscle aches, injury Neuro- denies  headache, dizziness, syncope, seizure activity       Objective:    BP 130/78 mmHg  Pulse 84  Temp(Src) 98.8 F (37.1 C) (Oral)  Resp 16  Ht 5\' 11"  (1.803 m)  Wt 170 lb (77.111 kg)  BMI 23.72 kg/m2 GEN- NAD, alert and oriented x3 HEENT- PERRL, EOMI, non injected sclera, pink conjunctiva, MMM, oropharynx clear Neck- Supple, no thyromegaly CVS- RRR, no murmur RESP-CTAB ABD-NABS,soft,NT,ND Psych- normal affect and mood  EXT- No edema Pulses- Radial, DP- 2+        Assessment & Plan:      Problem List Items Addressed This Visit    OCD (obsessive compulsive disorder)   Anxiety - Primary    COntinue klonopin as needed, is he using sparingly      Relevant Medications   mirtazapine (REMERON) 15 MG tablet    Other Visit Diagnoses    Decreased appetite        unclear cause? more anxiety related, though he has not lost any significant weight. He does not have any reflux symptoms, no classic symptoms of gallbladder eitolgy. He is actually been within the same weight range at 165-178 at the max over the past 5 years per records. He has a normal BMI for his height. Very curious to see where this appetite problems coming from he also has difficulty with sleep we know he has underlying anxiety and OCD. Number to try him on  Remeron 15 mg at bedtime he will continue the Klonopin as needed we will follow-up in 6 weeks.     Relevant Orders    CBC with Differential/Platelet    Comprehensive metabolic panel    TSH    Screening cholesterol level        Relevant Orders    Lipid panel    Screening for viral disease        STD screen, declines GC/CHlamydia    Relevant Orders    HIV antibody    RPR    Hepatitis C Antibody    Hepatitis B surface antibody       Note: This dictation was prepared with Dragon dictation along with smaller phrase technology. Any transcriptional errors that result from this process are unintentional.

## 2016-03-26 NOTE — Assessment & Plan Note (Signed)
COntinue klonopin as needed, is he using sparingly

## 2016-03-26 NOTE — Patient Instructions (Signed)
Try the remeron at bedtime F/U 6 weeks

## 2016-03-27 ENCOUNTER — Other Ambulatory Visit: Payer: Self-pay | Admitting: *Deleted

## 2016-03-27 LAB — CBC WITH DIFFERENTIAL/PLATELET
Basophils Absolute: 86 cells/uL (ref 0–200)
Basophils Relative: 1 %
Eosinophils Absolute: 172 cells/uL (ref 15–500)
Eosinophils Relative: 2 %
HCT: 44.3 % (ref 38.5–50.0)
Hemoglobin: 15.1 g/dL (ref 13.0–17.0)
Lymphocytes Relative: 32 %
Lymphs Abs: 2752 cells/uL (ref 850–3900)
MCH: 30.4 pg (ref 27.0–33.0)
MCHC: 34.1 g/dL (ref 32.0–36.0)
MCV: 89.1 fL (ref 80.0–100.0)
MPV: 12.7 fL — ABNORMAL HIGH (ref 7.5–12.5)
Monocytes Absolute: 688 cells/uL (ref 200–950)
Monocytes Relative: 8 %
Neutro Abs: 4902 cells/uL (ref 1500–7800)
Neutrophils Relative %: 57 %
Platelets: 238 10*3/uL (ref 140–400)
RBC: 4.97 MIL/uL (ref 4.20–5.80)
RDW: 14 % (ref 11.0–15.0)
WBC: 8.6 10*3/uL (ref 3.8–10.8)

## 2016-03-27 LAB — COMPREHENSIVE METABOLIC PANEL
ALT: 15 U/L (ref 9–46)
AST: 18 U/L (ref 10–40)
Albumin: 4.8 g/dL (ref 3.6–5.1)
Alkaline Phosphatase: 54 U/L (ref 40–115)
BUN: 6 mg/dL — ABNORMAL LOW (ref 7–25)
CO2: 25 mmol/L (ref 20–31)
Calcium: 9.3 mg/dL (ref 8.6–10.3)
Chloride: 107 mmol/L (ref 98–110)
Creat: 0.98 mg/dL (ref 0.60–1.35)
Glucose, Bld: 85 mg/dL (ref 70–99)
Potassium: 3.6 mmol/L (ref 3.5–5.3)
Sodium: 143 mmol/L (ref 135–146)
Total Bilirubin: 0.5 mg/dL (ref 0.2–1.2)
Total Protein: 7 g/dL (ref 6.1–8.1)

## 2016-03-27 LAB — LIPID PANEL
Cholesterol: 137 mg/dL (ref 125–200)
HDL: 61 mg/dL (ref >=40)
LDL Cholesterol: 54 mg/dL (ref <130)
Total CHOL/HDL Ratio: 2.2 Ratio (ref <=5.0)
Triglycerides: 111 mg/dL (ref <150)
VLDL: 22 mg/dL (ref <30)

## 2016-03-27 LAB — TSH: TSH: 1.39 mIU/L (ref 0.40–4.50)

## 2016-03-27 NOTE — Telephone Encounter (Signed)
Error incorrect Patient

## 2016-03-29 ENCOUNTER — Telehealth: Payer: Self-pay | Admitting: Family Medicine

## 2016-03-29 NOTE — Telephone Encounter (Signed)
[phone] Patient is calling to say that the mirizatine is making him too sleepy to take at work and wants to know if there is anything else that can be prescribed

## 2016-04-01 ENCOUNTER — Telehealth: Payer: Self-pay | Admitting: Family Medicine

## 2016-04-01 NOTE — Telephone Encounter (Signed)
MD please advise

## 2016-04-01 NOTE — Telephone Encounter (Signed)
Call placed to patient and patient made aware.   States that he will try new directions.

## 2016-04-01 NOTE — Telephone Encounter (Signed)
Please let patient know that all of his labs were not drawn we do not know what the problem was. His cholesterol metabolic panel CBC were all normal. His STD screening was not done. He can return to have this redrawn

## 2016-04-01 NOTE — Telephone Encounter (Signed)
Call placed to patient.   Advised of MD recommendations.  

## 2016-04-01 NOTE — Telephone Encounter (Signed)
There is no other medication I recommend he break in in half at bedtime He needs to take 1 hour before bedtime or around 9pm

## 2016-05-17 ENCOUNTER — Ambulatory Visit: Payer: Medicaid Other | Admitting: Family Medicine

## 2016-05-24 ENCOUNTER — Telehealth: Payer: Self-pay | Admitting: *Deleted

## 2016-05-24 NOTE — Telephone Encounter (Signed)
Received call from patient.   Patient reports that he has injured leg X1 week to 10 days prior.   Reported that area remains swollen and tender to touch.   Advised to MD has no open appointments at this time. Advised to go to UC if pain is severe. If not, patient can schedule appointment for next week.

## 2017-01-06 ENCOUNTER — Other Ambulatory Visit: Payer: Self-pay | Admitting: Family Medicine

## 2017-01-06 NOTE — Telephone Encounter (Signed)
Needs OV.  

## 2017-01-06 NOTE — Telephone Encounter (Signed)
Ok to refill??  Last office visit/ refill 03/26/2016, #2 refills.

## 2018-05-28 ENCOUNTER — Encounter: Payer: Self-pay | Admitting: Family Medicine

## 2018-06-02 ENCOUNTER — Encounter: Payer: Self-pay | Admitting: Family Medicine

## 2018-06-11 ENCOUNTER — Ambulatory Visit (INDEPENDENT_AMBULATORY_CARE_PROVIDER_SITE_OTHER): Payer: Self-pay | Admitting: Family Medicine

## 2018-06-11 ENCOUNTER — Telehealth: Payer: Self-pay | Admitting: Orthopedic Surgery

## 2018-06-11 ENCOUNTER — Emergency Department (HOSPITAL_COMMUNITY)
Admission: EM | Admit: 2018-06-11 | Discharge: 2018-06-11 | Disposition: A | Discharge status: Home / Self Care | Payer: Worker's Compensation | Attending: Emergency Medicine | Admitting: Emergency Medicine

## 2018-06-11 ENCOUNTER — Emergency Department (HOSPITAL_COMMUNITY): Payer: Worker's Compensation

## 2018-06-11 ENCOUNTER — Encounter: Payer: Self-pay | Admitting: Family Medicine

## 2018-06-11 ENCOUNTER — Encounter (HOSPITAL_COMMUNITY): Payer: Self-pay | Admitting: Emergency Medicine

## 2018-06-11 ENCOUNTER — Other Ambulatory Visit: Payer: Self-pay

## 2018-06-11 VITALS — BP 128/64 | HR 82 | Temp 98.1°F | Resp 16 | Ht 71.0 in | Wt 180.0 lb

## 2018-06-11 DIAGNOSIS — Y990 Civilian activity done for income or pay: Secondary | ICD-10-CM | POA: Insufficient documentation

## 2018-06-11 DIAGNOSIS — M545 Low back pain, unspecified: Secondary | ICD-10-CM

## 2018-06-11 DIAGNOSIS — W1789XA Other fall from one level to another, initial encounter: Secondary | ICD-10-CM

## 2018-06-11 DIAGNOSIS — W19XXXA Unspecified fall, initial encounter: Secondary | ICD-10-CM

## 2018-06-11 DIAGNOSIS — Y92818 Other transport vehicle as the place of occurrence of the external cause: Secondary | ICD-10-CM | POA: Diagnosis not present

## 2018-06-11 DIAGNOSIS — M25522 Pain in left elbow: Secondary | ICD-10-CM

## 2018-06-11 DIAGNOSIS — Y9389 Activity, other specified: Secondary | ICD-10-CM | POA: Diagnosis not present

## 2018-06-11 DIAGNOSIS — M25512 Pain in left shoulder: Secondary | ICD-10-CM

## 2018-06-11 DIAGNOSIS — S299XXA Unspecified injury of thorax, initial encounter: Secondary | ICD-10-CM | POA: Diagnosis present

## 2018-06-11 DIAGNOSIS — S20212A Contusion of left front wall of thorax, initial encounter: Secondary | ICD-10-CM | POA: Diagnosis not present

## 2018-06-11 DIAGNOSIS — F17210 Nicotine dependence, cigarettes, uncomplicated: Secondary | ICD-10-CM | POA: Diagnosis not present

## 2018-06-11 MED ORDER — METHOCARBAMOL 500 MG PO TABS: 500.0000 mg | ORAL_TABLET | Freq: Three times a day (TID) | ORAL | 0 refills | Status: DC | PRN

## 2018-06-11 MED ORDER — IBUPROFEN 800 MG PO TABS
800.0000 mg | ORAL_TABLET | Freq: Once | ORAL | Status: AC
  Administered 2018-06-11: 800 mg via ORAL
  Filled 2018-06-11: qty 1

## 2018-06-11 MED ORDER — METHOCARBAMOL 500 MG PO TABS
500.0000 mg | ORAL_TABLET | Freq: Once | ORAL | Status: AC
  Administered 2018-06-11: 500 mg via ORAL
  Filled 2018-06-11: qty 1

## 2018-06-11 MED ORDER — IBUPROFEN 800 MG PO TABS: 800.0000 mg | ORAL_TABLET | Freq: Three times a day (TID) | ORAL | 0 refills | Status: DC | PRN

## 2018-06-11 NOTE — Patient Instructions (Signed)
I provided you with your radiology reports.  All management of your pain and injuries needs to be done by a contracted [place] with your Worker's Comp. claim.  That includes all pain medication, future therapies referrals etc.  We cannot attempt to interfere in that management or prescribe different medications.  Any work restrictions or time off work will be managed again through the contracted occupational medicine or Worker's Comp. Company.  If you can get a ride I would get the muscle relaxers from the pharmacy that was prescribed for you, take some and go to your next appointment today.
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
## 2018-06-11 NOTE — Discharge Instructions (Signed)
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
## 2018-06-11 NOTE — Telephone Encounter (Signed)
Christopher Wyatt called this afternoon wanting to schedule an appointment.  He said he went to ER and had a WC back injury.  He said that he was referred by the ER to his PCP but that they didn't take WC.  I told him that I could not schedule him here without getting WC approval first.  I told him that he would have to speak to his HR Dept and find out what who he needs to see.  He understood and will do that.

## 2018-06-11 NOTE — ED Triage Notes (Addendum)
Pt states he was going to get in truck this morning and states his foot slipped and fell on concrete floor and a bundle of threaded rods. Pt c/o left elbow, lower back, and left shoulder. Denies hitting head or loc.

## 2018-06-25 ENCOUNTER — Ambulatory Visit (INDEPENDENT_AMBULATORY_CARE_PROVIDER_SITE_OTHER): Payer: Managed Care, Other (non HMO) | Admitting: Orthopaedic Surgery

## 2018-11-12 ENCOUNTER — Other Ambulatory Visit: Payer: Self-pay

## 2018-11-12 ENCOUNTER — Emergency Department (HOSPITAL_COMMUNITY)
Admission: EM | Admit: 2018-11-12 | Discharge: 2018-11-12 | Disposition: A | Discharge status: Home / Self Care | Payer: Medicaid Other | Attending: Emergency Medicine | Admitting: Emergency Medicine

## 2018-11-12 ENCOUNTER — Encounter (HOSPITAL_COMMUNITY): Payer: Self-pay | Admitting: Emergency Medicine

## 2018-11-12 DIAGNOSIS — F17220 Nicotine dependence, chewing tobacco, uncomplicated: Secondary | ICD-10-CM | POA: Diagnosis not present

## 2018-11-12 DIAGNOSIS — F17210 Nicotine dependence, cigarettes, uncomplicated: Secondary | ICD-10-CM | POA: Diagnosis not present

## 2018-11-12 DIAGNOSIS — Z79899 Other long term (current) drug therapy: Secondary | ICD-10-CM | POA: Diagnosis not present

## 2018-11-12 DIAGNOSIS — R509 Fever, unspecified: Secondary | ICD-10-CM | POA: Diagnosis present

## 2018-11-12 DIAGNOSIS — N390 Urinary tract infection, site not specified: Secondary | ICD-10-CM | POA: Insufficient documentation

## 2018-11-12 LAB — URINALYSIS, ROUTINE W REFLEX MICROSCOPIC
Bilirubin Urine: NEGATIVE
Glucose, UA: NEGATIVE mg/dL
Hgb urine dipstick: NEGATIVE
Ketones, ur: NEGATIVE mg/dL
Nitrite: NEGATIVE
Protein, ur: NEGATIVE mg/dL
Specific Gravity, Urine: 1.024 (ref 1.005–1.030)
WBC, UA: >50 WBC/hpf — ABNORMAL HIGH (ref 0–5)
pH: 5 (ref 5.0–8.0)

## 2018-11-12 MED ORDER — ACETAMINOPHEN 325 MG PO TABS
ORAL_TABLET | ORAL | Status: AC
  Filled 2018-11-12: qty 2

## 2018-11-12 MED ORDER — ACETAMINOPHEN 325 MG PO TABS
650.0000 mg | ORAL_TABLET | Freq: Once | ORAL | Status: AC | PRN
  Administered 2018-11-12: 650 mg via ORAL
  Filled 2018-11-12: qty 2

## 2018-11-12 MED ORDER — DOXYCYCLINE HYCLATE 100 MG PO CAPS: 100.0000 mg | ORAL_CAPSULE | Freq: Two times a day (BID) | ORAL | 0 refills | Status: DC

## 2018-11-12 NOTE — ED Triage Notes (Signed)
Pt reports cold chills to both arms and legs, but feeling hot now and loss of appetite that began around 0900 this morning.

## 2018-11-12 NOTE — ED Provider Notes (Signed)
[facility] EMERGENCY DEPARTMENT Provider Note   CSN: [id number] Arrival date & time: 11/12/18  1253    History   Chief Complaint Chief Complaint  Patient presents with  . Chills    HPI Christopher Wyatt is a 43 y.o. male.     The history is provided by the patient. No language interpreter was used.  Fever  Temp source:  Subjective Severity:  Moderate Onset quality:  Gradual Duration:  1 day Timing:  Constant Progression:  Worsening Relieved by:  Nothing Worsened by:  Nothing Associated symptoms: chills and dysuria   Risk factors: sick contacts   Pt reports having chills today.  Pt reports urinating less than usual.   Past Medical History:  Diagnosis Date  . Anxiety    Chronic  . OCD (obsessive compulsive disorder)     Patient Active Problem List   Diagnosis Date Noted  . Nephrolithiasis 01/27/2014  . Tobacco use disorder 11/05/2013  . Knee pain 07/18/2013  . Anxiety   . OCD (obsessive compulsive disorder)     History reviewed. No pertinent surgical history.      Home Medications    Prior to Admission medications   Medication Sig Start Date End Date Taking? Authorizing Provider  clonazePAM (KLONOPIN) 1 MG tablet Take 1 tablet (1 mg total) by mouth 2 (two) times daily as needed. 03/26/16   [place], Modena Nunnery, MD  ibuprofen (ADVIL,MOTRIN) 800 MG tablet Take 1 tablet (800 mg total) by mouth every 8 (eight) hours as needed for moderate pain. 06/11/18   Long, Wonda Olds, MD  methocarbamol (ROBAXIN) 500 MG tablet Take 1 tablet (500 mg total) by mouth every 8 (eight) hours as needed for muscle spasms. 06/11/18   Long, Wonda Olds, MD  mirtazapine (REMERON) 15 MG tablet Take 1 tablet (15 mg total) by mouth at bedtime. 03/26/16   Alycia Rossetti, MD    Family History No family history on file.  Social History Social History   Tobacco Use  . Smoking status: Current Every Day Smoker    Packs/day: 0.50    Types: Cigarettes  . Smokeless tobacco: Current User   Types: Snuff  Substance Use Topics  . Alcohol use: Yes    Comment: occasional  . Drug use: No     Allergies   Paroxetine hcl   Review of Systems Review of Systems  Constitutional: Positive for chills and fever.  Genitourinary: Positive for dysuria.  All other systems reviewed and are negative.    Physical Exam Updated Vital Signs There were no vitals taken for this visit.  Physical Exam Vitals signs and nursing note reviewed.  Constitutional:      Appearance: He is well-developed.  HENT:     Head: Normocephalic.     Right Ear: Tympanic membrane normal.     Left Ear: Tympanic membrane normal.     Nose: Nose normal.     Mouth/Throat:     Mouth: Mucous membranes are moist.  Eyes:     Pupils: Pupils are equal, round, and reactive to light.  Neck:     Musculoskeletal: Normal range of motion.  Cardiovascular:     Rate and Rhythm: Normal rate.  Pulmonary:     Effort: Pulmonary effort is normal.  Abdominal:     General: Abdomen is flat. There is no distension.  Musculoskeletal: Normal range of motion.  Skin:    General: Skin is warm.  Neurological:     General: No focal deficit present.  Mental Status: He is alert and oriented to person, place, and time.      ED Treatments / Results  Labs (all labs ordered are listed, but only abnormal results are displayed) Labs Reviewed  URINALYSIS, ROUTINE W REFLEX MICROSCOPIC - Abnormal; Notable for the following components:      Result Value   Color, Urine AMBER (*)    APPearance CLOUDY (*)    Leukocytes,Ua MODERATE (*)    WBC, UA >50 (*)    Bacteria, UA RARE (*)    All other components within normal limits    EKG None  Radiology No results found.  Procedures Procedures (including critical care time)  Medications Ordered in ED Medications  acetaminophen (TYLENOL) tablet 650 mg (650 mg Oral Given 11/12/18 1319)     Initial Impression / Assessment and Plan / ED Course  I have reviewed the triage vital  signs and the nursing notes.  Pertinent labs & imaging results that were available during my care of the patient were reviewed by me and considered in my medical decision making (see chart for details).        Urine shows wbc TNTC rbc 11-20 Wbc greater 50   Final Clinical Impressions(s) / ED Diagnoses   Final diagnoses:  Urinary tract infection without hematuria, site unspecified    ED Discharge Orders         Ordered    doxycycline (VIBRAMYCIN) 100 MG capsule  2 times daily     11/12/18 1527        An After Visit Summary was printed and given to the patient.    Fransico Meadow, [place] 11/12/18 1531    Fredia Sorrow, MD 11/14/18 1630

## 2018-11-12 NOTE — Discharge Instructions (Signed)
See your Physician for recheck in 1 week if symptoms persist

## 2018-11-12 NOTE — ED Notes (Signed)
Patient seen and evaluated by EDPa for initial assessment. 

## 2018-11-15 LAB — URINE CULTURE: Culture: >=100000 — AB

## 2018-11-16 DIAGNOSIS — M542 Cervicalgia: Secondary | ICD-10-CM | POA: Diagnosis not present

## 2018-11-16 DIAGNOSIS — M9901 Segmental and somatic dysfunction of cervical region: Secondary | ICD-10-CM | POA: Diagnosis not present

## 2018-11-16 DIAGNOSIS — M546 Pain in thoracic spine: Secondary | ICD-10-CM | POA: Diagnosis not present

## 2018-11-16 DIAGNOSIS — M9902 Segmental and somatic dysfunction of thoracic region: Secondary | ICD-10-CM | POA: Diagnosis not present

## 2018-11-16 NOTE — Progress Notes (Signed)
Called and spoke to Mr. Leverich, after speaking with PA Sophia Caccavale regarding his urine culture results. He reports some night sweats the night after his visit to the ED. However, he has been feeling much better since Saturday 3/7. Denies fever or chills. No more pain on urination. No urinary symptoms. He is reporting occasional mild constipation since starting doxycycline; encouraged increased water intake. No changes to antibiotics at this time.  Janae Bridgeman, PharmD PGY1 Pharmacy Resident Phone: [phone] 11/16/2018 9:39 AM

## 2018-11-27 DIAGNOSIS — M546 Pain in thoracic spine: Secondary | ICD-10-CM | POA: Diagnosis not present

## 2018-11-27 DIAGNOSIS — M542 Cervicalgia: Secondary | ICD-10-CM | POA: Diagnosis not present

## 2018-11-27 DIAGNOSIS — M9901 Segmental and somatic dysfunction of cervical region: Secondary | ICD-10-CM | POA: Diagnosis not present

## 2018-11-27 DIAGNOSIS — M9902 Segmental and somatic dysfunction of thoracic region: Secondary | ICD-10-CM | POA: Diagnosis not present

## 2018-12-02 ENCOUNTER — Telehealth: Payer: Self-pay | Admitting: *Deleted

## 2018-12-02 DIAGNOSIS — M9902 Segmental and somatic dysfunction of thoracic region: Secondary | ICD-10-CM | POA: Diagnosis not present

## 2018-12-02 DIAGNOSIS — M542 Cervicalgia: Secondary | ICD-10-CM | POA: Diagnosis not present

## 2018-12-02 DIAGNOSIS — M9901 Segmental and somatic dysfunction of cervical region: Secondary | ICD-10-CM | POA: Diagnosis not present

## 2018-12-02 DIAGNOSIS — M546 Pain in thoracic spine: Secondary | ICD-10-CM | POA: Diagnosis not present

## 2018-12-02 NOTE — Telephone Encounter (Signed)
Received VM from patient. [phone]~ telephone.   Reports that he was seen in ER on 11/12/2018 for UTI. States that he was given Doxycycline x10 days. Patient reports that he has completed ABTx, but continues to have Sx.   Call placed to patient and advised that OV will be required per VM.

## 2018-12-03 ENCOUNTER — Ambulatory Visit (INDEPENDENT_AMBULATORY_CARE_PROVIDER_SITE_OTHER): Payer: Medicaid Other | Admitting: Family Medicine

## 2018-12-03 ENCOUNTER — Other Ambulatory Visit: Payer: Self-pay

## 2018-12-03 ENCOUNTER — Encounter: Payer: Self-pay | Admitting: Family Medicine

## 2018-12-03 VITALS — BP 102/64 | HR 84 | Temp 98.1°F | Resp 18 | Ht 72.0 in | Wt 175.4 lb

## 2018-12-03 DIAGNOSIS — R8281 Pyuria: Secondary | ICD-10-CM | POA: Diagnosis not present

## 2018-12-03 DIAGNOSIS — N390 Urinary tract infection, site not specified: Secondary | ICD-10-CM | POA: Diagnosis not present

## 2018-12-03 DIAGNOSIS — R630 Anorexia: Secondary | ICD-10-CM | POA: Diagnosis not present

## 2018-12-03 DIAGNOSIS — R82998 Other abnormal findings in urine: Secondary | ICD-10-CM | POA: Diagnosis not present

## 2018-12-03 DIAGNOSIS — Z87442 Personal history of urinary calculi: Secondary | ICD-10-CM

## 2018-12-03 DIAGNOSIS — N419 Inflammatory disease of prostate, unspecified: Secondary | ICD-10-CM

## 2018-12-03 DIAGNOSIS — M545 Low back pain, unspecified: Secondary | ICD-10-CM

## 2018-12-03 DIAGNOSIS — R61 Generalized hyperhidrosis: Secondary | ICD-10-CM

## 2018-12-03 LAB — URINALYSIS, ROUTINE W REFLEX MICROSCOPIC
Bilirubin Urine: NEGATIVE
Glucose, UA: NEGATIVE
Hyaline Cast: NONE SEEN /LPF
Ketones, ur: NEGATIVE
Nitrite: NEGATIVE
Specific Gravity, Urine: 1.023 (ref 1.001–1.03)
pH: 5.5 (ref 5.0–8.0)

## 2018-12-03 LAB — MICROSCOPIC MESSAGE

## 2018-12-03 NOTE — Progress Notes (Signed)
Patient ID: TAFT WORTHING, male    DOB: November 05, 1975, 43 y.o.   MRN: [id number]  PCP: Alycia Rossetti, MD  Chief Complaint  Patient presents with  . Urinary Tract Infection    polyuria, back pain, taken doxy    Subjective:   Christopher Wyatt is a 43 y.o. male, presents to clinic with CC of uti?  He was seen in the ER at [facility] on 11/12/2018 - although he has tremendous difficulty explaining to me why he even went to the ER.  Apparently over a year ago he started having night sweats.  Something related to this is why he went to the ER on 11/12/2018.  He later said he felt "like something wasn't right"  Like he was sick, but he did not have any other focal sx or pain.  There he says he was discussing his diet and the ER PA ran a UA and dx him with UTI.  In reviewing the ER encounter the urine showed too numerous to count white blood cells, many red blood cells, the chief complaint of the ER visit was fever, and he was treated with doxycycline 100 mg twice daily for 10 days.  Patient states he took the antibiotics for a few days, did start to feel a bit better and he stopped taking them after about a week because it was causing some constipation symptoms, although he says his stool is soft.  He had pressure with BM's in his perineal area.   He denies any rectal pain, BRBPR, melena.  He denies at any time (to me) dysuria, hematuria, straining to start urine stream, dribbling, double voiding.  He shows me a picture of his urine from the ER visit - concentrated amber color.  He says he still only drinks a little bit of alcohol, soda or energy drinks daily.  He denies penile discharge, genital rash/lesions, scrotal/testicular pain/swelling, abdominal pain, fever, N, V, flank pain, urinary urgency or frequency.  He denies any chance of STD's - states he has only had one male sexual partner for > 12 years. To the CMA when rooming he reported urinary frequency and back pain.   After asking him  and redirecting him several times what his symptoms were that caused him to go to the ER or why he made an appointment today, he at no point endorsed urinary frequency, dysuria, hematuria, abd pain, flank pain or back pain.   Per further chart review pt did have + STD tests in the past (gonorrhea in 2014) Urine culture from ER was + for E.coli and pansensitive   Pt also states that in the past he had rhabdo and "sweat tea" pee Chart has hx of kidney stones     Patient Active Problem List   Diagnosis Date Noted  . Nephrolithiasis 01/27/2014  . Tobacco use disorder 11/05/2013  . Knee pain 07/18/2013  . Anxiety   . OCD (obsessive compulsive disorder)      Prior to Admission medications   Medication Sig Start Date End Date Taking? Authorizing Provider  clonazePAM (KLONOPIN) 1 MG tablet Take 1 tablet (1 mg total) by mouth 2 (two) times daily as needed. 03/26/16  Yes [place], Modena Nunnery, MD  mirtazapine (REMERON) 15 MG tablet Take 1 tablet (15 mg total) by mouth at bedtime. 03/26/16  Yes [place], Modena Nunnery, MD     Allergies  Allergen Reactions  . Paroxetine Hcl     Erectile dysfunction     No  family history on file.   Social History   Socioeconomic History  . Marital status: Single    Spouse name: Not on file  . Number of children: Not on file  . Years of education: Not on file  . Highest education level: Not on file  Occupational History  . Not on file  Social Needs  . Financial resource strain: Not on file  . Food insecurity:    Worry: Not on file    Inability: Not on file  . Transportation needs:    Medical: Not on file    Non-medical: Not on file  Tobacco Use  . Smoking status: Current Every Day Smoker    Packs/day: 0.50    Types: Cigarettes  . Smokeless tobacco: Current User    Types: Snuff  Substance and Sexual Activity  . Alcohol use: Yes    Comment: occasional  . Drug use: No  . Sexual activity: Not on file  Lifestyle  . Physical activity:    Days per  week: Not on file    Minutes per session: Not on file  . Stress: Not on file  Relationships  . Social connections:    Talks on phone: Not on file    Gets together: Not on file    Attends religious service: Not on file    Active member of club or organization: Not on file    Attends meetings of clubs or organizations: Not on file    Relationship status: Not on file  . Intimate partner violence:    Fear of current or ex partner: Not on file    Emotionally abused: Not on file    Physically abused: Not on file    Forced sexual activity: Not on file  Other Topics Concern  . Not on file  Social History Narrative  . Not on file     Review of Systems  Constitutional: Positive for chills and diaphoresis. Negative for activity change, appetite change (history of poor appetite - no change from baseline), fatigue, fever and unexpected weight change.  HENT: Negative.   Eyes: Negative.   Respiratory: Negative.   Cardiovascular: Negative.   Gastrointestinal: Negative.  Negative for abdominal distention, abdominal pain, anal bleeding, blood in stool, diarrhea, nausea, rectal pain and vomiting.  Endocrine: Negative.   Genitourinary: Negative.   Musculoskeletal: Negative.   Skin: Negative.  Negative for color change and pallor.  Allergic/Immunologic: Negative.   Neurological: Negative.   Hematological: Negative.   Psychiatric/Behavioral: Negative.   All other systems reviewed and are negative.      Objective:    Vitals:   12/03/18 0922  BP: 102/64  Pulse: 84  Resp: 18  Temp: 98.1 F (36.7 C)  SpO2: 97%  Weight: 175 lb 6.4 oz (79.6 kg)  Height: 6' (1.829 m)      Physical Exam Vitals signs and nursing note reviewed.  Constitutional:      General: He is not in acute distress.    Appearance: Normal appearance. He is well-developed and normal weight. He is not ill-appearing, toxic-appearing or diaphoretic.  HENT:     Head: Normocephalic and atraumatic.     Right Ear:  External ear normal.     Left Ear: External ear normal.     Nose: Nose normal.     Mouth/Throat:     Mouth: Mucous membranes are dry.  Eyes:     General: No scleral icterus.       Right eye: No discharge.  Left eye: No discharge.     Conjunctiva/sclera: Conjunctivae normal.  Neck:     Musculoskeletal: Normal range of motion.     Trachea: No tracheal deviation.  Cardiovascular:     Rate and Rhythm: Normal rate and regular rhythm.     Pulses: Normal pulses.     Heart sounds: Normal heart sounds. No murmur. No friction rub. No gallop.   Pulmonary:     Effort: Pulmonary effort is normal. No respiratory distress.     Breath sounds: Normal breath sounds. No stridor. No wheezing, rhonchi or rales.  Abdominal:     General: Abdomen is flat. Bowel sounds are normal. There is no distension.     Palpations: There is no mass.     Tenderness: There is no abdominal tenderness. There is no right CVA tenderness or left CVA tenderness.  Musculoskeletal: Normal range of motion.  Skin:    General: Skin is warm and dry.     Capillary Refill: Capillary refill takes less than 2 seconds.     Coloration: Skin is not jaundiced or pale.     Findings: No rash.  Neurological:     Mental Status: He is alert.     Motor: No abnormal muscle tone.     Coordination: Coordination normal.  Psychiatric:        Attention and Perception: He is inattentive.        Speech: Speech is tangential.        Behavior: Behavior is hyperactive. Behavior is not agitated, slowed, aggressive, withdrawn or combative.        Judgment: Judgment is impulsive and inappropriate.     Comments: Pleasant and anxious mood, but slightly inappropriate, talkative, but difficulty with answering questions directly, tangential           Assessment & Plan:    Pt here for follow up UTI? Difficulty stating why he is here today - to CMA he reported polyuria and back pain, no me he endorses many vague sx ranging from several years ago,  to one year ago, and then difficulty stating recent relative sx from ER visit to why he is here today for OV.    Endorses vague pressure sx to perineum with BM, constipation feelings, but stool is soft, urine dark, but always that way cause he barely drinks any liquid all day long.  He had night sweats apparently 1 year ago although I do not know why, and he had similar symptoms feeling ill a couple weeks ago, was diagnosed with UTI in the ER, took antibiotics for around a week ?  He started to feel bad again about 2 days ago.  Not much else that is very focal about his history.  He was here today for over an hour and a half unable to give enough urine volume required for testing.  He refused digital rectal exam to help assess his prostate and to rule out prostatitis.  He denies any high risk sexual behavior stating he is one partner for over a decade.  When trying to get the patient to do his lab work and provide another urine sample and leave after being here for a lengthy amount of time he asked if I can help him with his appetite and help him stop smoking but he tried Chantix in the past and it did work, I did not even have time to ask him about appetite changes because he stated that his diet is poor at his baseline and unchanged.  When trying to do physical exam he stated " probably think of a drug addict," he is very difficult historian, very difficult to redirect, difficult to explain plan and get him out of clinic today, I explained to him because the symptoms are very generalized and vague and because he will not let me check his prostate and because he was unable to give significant urine to do testing, I encouraged him to leave another sample and we will obtain basic blood work and I will call him with results and with plan.  The urine dip was resulted at the time of this visit and prior to his leaving, was significant for blood, protein and leukocytes.  He did look improved from the urinalysis  done in the ER.   The way he describes these vague low groin symptoms with pressure and vague symptoms when trying to have a bowel movement but not actually having any hard stools or straining I suspicious that he may have some prostate involvement.  He did look up PSA, research online, asked if we can have that done today.  I explained its utilization as a screening test for older men and for 3 out of 1000 can lead to prevention of early death secondary to more aggressive prostate cancers but on the whole was not usually used to diagnose acute issues.  I did order STD testing, repeat urine culture, CBC, CMP and A1C (to r/o DM?  With vague sx, some mention of polyuria), pt appears hypovolemic, dry matted tongue.  Encouraged to push ample fluids. He was otherwise slightly thin but well appearing, abdominal exam benign, VSS.      ICD-10-CM   1. Urinary tract infection without hematuria, site unspecified N39.0 Urinalysis, Routine w reflex microscopic    Urine Culture    Trichomonas vaginalis RNA, Ql,Males    C. trachomatis/N. gonorrhoeae RNA    HIV Antibody (routine testing w rflx)    RPR    CBC with Differential/Platelet    COMPLETE METABOLIC PANEL WITH GFR    Hemoglobin A1c    TSH    PSA  2. Decreased appetite R63.0 CBC with Differential/Platelet    COMPLETE METABOLIC PANEL WITH GFR    Hemoglobin A1c    TSH  3. Dark urine R82.998 Urine Culture    Trichomonas vaginalis RNA, Ql,Males    C. trachomatis/N. gonorrhoeae RNA    HIV Antibody (routine testing w rflx)    RPR    CBC with Differential/Platelet    COMPLETE METABOLIC PANEL WITH GFR    Hemoglobin A1c    TSH    PSA  4. Night sweats R61 Urine Culture    CBC with Differential/Platelet    COMPLETE METABOLIC PANEL WITH GFR    Hemoglobin A1c    TSH  5. Low back pain without sciatica, unspecified back pain laterality, unspecified chronicity M54.5 Urine Culture  6. Pyuria R82.81 Urine Culture    Trichomonas vaginalis RNA, Ql,Males     C. trachomatis/N. gonorrhoeae RNA    PSA    Check renal function, electrolytes, CBC, r/o DM, check for STD's.  Anticipate need to tx STD's or prostatitis, and I discussed the longer course of abx required for this.  I encouraged him to return to discuss his other chronic problems with his PCP.    He did briefly mention near the end of his visit -  low back pain, pointing to low lumbar spine - said in the context of "oh ya, I saw you before right?  After that fall for a second opinion?"  Hx of back pain in the past.  No reproducible back pain on exam, no midline tenderness no paraspinal muscle tenderness to palpation from cervical to lumbar spine, normal range of motion able to get up and down off the exam table bending forward and standing up straight without any difficulty, normal gait.  He again refused genital exam and DRE exam - was explained limitation of my ability to evaluate, diagnose and treat.  He may want to follow up with urology for further eval.   Delsa Grana, PA-C 12/03/18 3:55 PM

## 2018-12-04 ENCOUNTER — Other Ambulatory Visit: Payer: Self-pay

## 2018-12-04 DIAGNOSIS — M546 Pain in thoracic spine: Secondary | ICD-10-CM | POA: Diagnosis not present

## 2018-12-04 DIAGNOSIS — M9902 Segmental and somatic dysfunction of thoracic region: Secondary | ICD-10-CM | POA: Diagnosis not present

## 2018-12-04 DIAGNOSIS — N390 Urinary tract infection, site not specified: Secondary | ICD-10-CM

## 2018-12-04 DIAGNOSIS — M542 Cervicalgia: Secondary | ICD-10-CM | POA: Diagnosis not present

## 2018-12-04 DIAGNOSIS — N419 Inflammatory disease of prostate, unspecified: Secondary | ICD-10-CM

## 2018-12-04 DIAGNOSIS — M9901 Segmental and somatic dysfunction of cervical region: Secondary | ICD-10-CM | POA: Diagnosis not present

## 2018-12-04 LAB — CBC WITH DIFFERENTIAL/PLATELET
Absolute Monocytes: 959 cells/uL — ABNORMAL HIGH (ref 200–950)
Basophils Absolute: 113 cells/uL (ref 0–200)
Basophils Relative: 1.2 %
Eosinophils Absolute: 94 cells/uL (ref 15–500)
Eosinophils Relative: 1 %
HCT: 52 % — ABNORMAL HIGH (ref 38.5–50.0)
Hemoglobin: 17.7 g/dL — ABNORMAL HIGH (ref 13.2–17.1)
Lymphs Abs: 1927 cells/uL (ref 850–3900)
MCH: 30.8 pg (ref 27.0–33.0)
MCHC: 34 g/dL (ref 32.0–36.0)
MCV: 90.4 fL (ref 80.0–100.0)
MPV: 13.6 fL — ABNORMAL HIGH (ref 7.5–12.5)
Monocytes Relative: 10.2 %
Neutro Abs: 6307 cells/uL (ref 1500–7800)
Neutrophils Relative %: 67.1 %
Platelets: 248 10*3/uL (ref 140–400)
RBC: 5.75 10*6/uL (ref 4.20–5.80)
RDW: 12.9 % (ref 11.0–15.0)
Total Lymphocyte: 20.5 %
WBC: 9.4 10*3/uL (ref 3.8–10.8)

## 2018-12-04 LAB — COMPLETE METABOLIC PANEL WITH GFR
AG Ratio: 1.7 (calc) (ref 1.0–2.5)
ALT: 21 U/L (ref 9–46)
AST: 21 U/L (ref 10–40)
Albumin: 4.6 g/dL (ref 3.6–5.1)
Alkaline phosphatase (APISO): 59 U/L (ref 36–130)
BUN/Creatinine Ratio: 7 (calc) (ref 6–22)
BUN: 6 mg/dL — ABNORMAL LOW (ref 7–25)
CO2: 22 mmol/L (ref 20–32)
Calcium: 10.2 mg/dL (ref 8.6–10.3)
Chloride: 101 mmol/L (ref 98–110)
Creat: 0.9 mg/dL (ref 0.60–1.35)
GFR, Est African American: 122 mL/min/{1.73_m2} (ref >=60)
GFR, Est Non African American: 105 mL/min/{1.73_m2} (ref >=60)
Globulin: 2.7 g/dL (calc) (ref 1.9–3.7)
Glucose, Bld: 88 mg/dL (ref 65–99)
Potassium: 5.2 mmol/L (ref 3.5–5.3)
Sodium: 139 mmol/L (ref 135–146)
Total Bilirubin: 0.9 mg/dL (ref 0.2–1.2)
Total Protein: 7.3 g/dL (ref 6.1–8.1)

## 2018-12-04 LAB — HEMOGLOBIN A1C
Hgb A1c MFr Bld: 5.2 % of total Hgb (ref <5.7)
Mean Plasma Glucose: 103 (calc)
eAG (mmol/L): 5.7 (calc)

## 2018-12-04 LAB — PSA: PSA: 8.8 ng/mL — ABNORMAL HIGH (ref <=4.0)

## 2018-12-04 LAB — TRICHOMONAS VAGINALIS RNA, QL,MALES: Trichomonas vaginalis RNA: NOT DETECTED

## 2018-12-04 LAB — TSH: TSH: 1.02 mIU/L (ref 0.40–4.50)

## 2018-12-04 LAB — RPR: RPR Ser Ql: NONREACTIVE

## 2018-12-04 LAB — C. TRACHOMATIS/N. GONORRHOEAE RNA
C. trachomatis RNA, TMA: NOT DETECTED
N. gonorrhoeae RNA, TMA: NOT DETECTED

## 2018-12-04 LAB — HIV ANTIBODY (ROUTINE TESTING W REFLEX): HIV 1&2 Ab, 4th Generation: NONREACTIVE

## 2018-12-04 MED ORDER — CIPROFLOXACIN HCL 500 MG PO TABS: 500.0000 mg | ORAL_TABLET | Freq: Two times a day (BID) | ORAL | 0 refills | Status: AC

## 2018-12-04 MED ORDER — CIPROFLOXACIN HCL 500 MG PO TABS: 500.0000 mg | ORAL_TABLET | Freq: Two times a day (BID) | ORAL | 0 refills | Status: DC

## 2018-12-04 NOTE — Addendum Note (Signed)
Addended by: Delsa Grana on: 12/04/2018 01:32 PM   Modules accepted: Orders

## 2018-12-05 LAB — URINE CULTURE
MICRO NUMBER:: 355611
SPECIMEN QUALITY:: ADEQUATE

## 2018-12-16 ENCOUNTER — Ambulatory Visit: Payer: Self-pay | Admitting: Family Medicine

## 2018-12-25 ENCOUNTER — Other Ambulatory Visit: Payer: Self-pay

## 2018-12-25 NOTE — Telephone Encounter (Signed)
Requested Prescriptions   Pending Prescriptions Disp Refills  . clonazePAM (KLONOPIN) 1 MG tablet 45 tablet 1    Sig: Take 1 tablet (1 mg total) by mouth 2 (two) times daily as needed.   Last OV with you 03/26/2016  Last filled 03/26/2016

## 2018-12-25 NOTE — Telephone Encounter (Signed)
Left VM for pt to call clinic. 

## 2018-12-25 NOTE — Telephone Encounter (Signed)
He has not had med filled > 1 year, needs telephone visit to get this filled, schedule for next week

## 2018-12-29 ENCOUNTER — Other Ambulatory Visit: Payer: Self-pay

## 2018-12-29 ENCOUNTER — Encounter: Payer: Self-pay | Admitting: Family Medicine

## 2018-12-29 ENCOUNTER — Ambulatory Visit (INDEPENDENT_AMBULATORY_CARE_PROVIDER_SITE_OTHER): Payer: Medicaid Other | Admitting: Family Medicine

## 2018-12-29 DIAGNOSIS — N390 Urinary tract infection, site not specified: Secondary | ICD-10-CM | POA: Diagnosis not present

## 2018-12-29 DIAGNOSIS — F419 Anxiety disorder, unspecified: Secondary | ICD-10-CM

## 2018-12-29 DIAGNOSIS — R972 Elevated prostate specific antigen [PSA]: Secondary | ICD-10-CM | POA: Diagnosis not present

## 2018-12-29 DIAGNOSIS — B9620 Unspecified Escherichia coli [E. coli] as the cause of diseases classified elsewhere: Secondary | ICD-10-CM | POA: Diagnosis not present

## 2018-12-29 MED ORDER — ALPRAZOLAM 0.5 MG PO TABS: 0.5000 mg | ORAL_TABLET | Freq: Two times a day (BID) | ORAL | 0 refills | Status: DC | PRN

## 2018-12-29 NOTE — Progress Notes (Signed)
Virtual Visit via Telephone Note  I connected with Christopher Wyatt on 12/29/18 at10:55am  by telephone and verified that I am speaking with the correct person using two identifiers.  Pt location: at home  Physician location: at home   On call- Patient and physician    I discussed the limitations, risks, security and privacy concerns of performing an evaluation and management service by telephone and the availability of in person appointments. I also discussed with the patient that there may be a patient responsible charge related to this service. The patient expressed understanding and agreed to proceed.   History of Present Illness:  Pt called in , he has had increased anxiety, since he broke up with his GF last week, He does have history of longstanding anxiety, previously treated with remeron and klonopin, buthas not had meds refilled in 2 years. He stated he held on to a few klomopin but would take as needed. He admitted to adultery to his long time GF of 10 years last week, and he has been "heart broken", not sleeping well, feels on edge. They are now speaking and he wants to work things out. His appetite has decreased.  He did take 1 xanax of his mother and it helped, he felt his old klonopin made him drowsy  NO SI Does feel a little sad but not depressed  Seen a few weeks ago for E coli UTI after visit to ER, also had elevated PSA, so concern for prostatitis. His STD screening was negative.  He admits he still has some antibiotics left, but denies dysuria, back pain, abd pain, fever    Observations/Objective:  No acute distress noted over phone, very pleasant, normal speech  Assessment and Plan: GAD- long standing history, now with recent break-up due to infidelity on his behalf. He requested trying the xanax to help with sleep temporarily, will give xanax 0.5mg  BID prn #45 tabs, advised of addictive potentional Will f/u up with him in a few weeks, based on results of his  labs  Elevated PSA- likely prostatitis in setting of E coli UTI, advised him to finish all his antibiotics, come in for lab visit next Wed, for repeat UA/UC/ PSA Discussed need to differentiate from infection and or possible prostate cancer   Follow Up Instructions:    I discussed the assessment and treatment plan with the patient. The patient was provided an opportunity to ask questions and all were answered. The patient agreed with the plan and demonstrated an understanding of the instructions.   The patient was advised to call back or seek an in-person evaluation if the symptoms worsen or if the condition fails to improve as anticipated.  I provided  18 minutes of non-face-to-face time during this encounter. End time: 11:13  Vic Blackbird, MD

## 2019-01-01 DIAGNOSIS — M9901 Segmental and somatic dysfunction of cervical region: Secondary | ICD-10-CM | POA: Diagnosis not present

## 2019-01-01 DIAGNOSIS — M9902 Segmental and somatic dysfunction of thoracic region: Secondary | ICD-10-CM | POA: Diagnosis not present

## 2019-01-01 DIAGNOSIS — M542 Cervicalgia: Secondary | ICD-10-CM | POA: Diagnosis not present

## 2019-01-01 DIAGNOSIS — M546 Pain in thoracic spine: Secondary | ICD-10-CM | POA: Diagnosis not present

## 2019-01-05 DIAGNOSIS — F4323 Adjustment disorder with mixed anxiety and depressed mood: Secondary | ICD-10-CM | POA: Diagnosis not present

## 2019-01-06 ENCOUNTER — Telehealth: Payer: Self-pay | Admitting: Family Medicine

## 2019-01-06 MED ORDER — TRAZODONE HCL 50 MG PO TABS: 25.0000 mg | ORAL_TABLET | Freq: Every evening | ORAL | 3 refills | Status: DC | PRN

## 2019-01-06 NOTE — Telephone Encounter (Signed)
I received a phone call from patient stating that he has been taking the Xanax at bedtime for 1 week and has been having to take 2 (1 mg) to feel any effects and it does not seem to be calming him. He states that he does not need in the morning's so he does not take them but at bedtime. He wants to know if you recommend anything else since he is not getting much benefit from 1mg  of Xanax. Please advise?

## 2019-01-06 NOTE — Telephone Encounter (Signed)
I recommend going back on the remeron to help with anxiety and sleep, he can continue the 1mg  xanax with this. Remeron 15mg  at bedtime, this will also help with the stress he has experienced recently with his girlfriend.

## 2019-01-06 NOTE — Telephone Encounter (Signed)
Spoke with patient and informed him that Dr. Buelah Manis recommends Trazadone and that Xanax will not be increased above 1mg . Patient verbalized understanding.

## 2019-01-06 NOTE — Telephone Encounter (Signed)
Spoke with patient and informed him that you recommend starting back on remeron with Xanax. Patient stated that he had issues with Remeron when he took it and would like to know what you recommend.

## 2019-01-06 NOTE — Telephone Encounter (Signed)
Not sure what issues he is talking about Start trazodone 50mg  at bedtime forsleep instead, most people tolerate this without any issues I am NOT going to increase the xanax above 1mg , please make sure he understands that.

## 2019-01-11 DIAGNOSIS — F4322 Adjustment disorder with anxiety: Secondary | ICD-10-CM | POA: Diagnosis not present

## 2019-01-11 DIAGNOSIS — F411 Generalized anxiety disorder: Secondary | ICD-10-CM | POA: Diagnosis not present

## 2019-01-12 DIAGNOSIS — F4322 Adjustment disorder with anxiety: Secondary | ICD-10-CM | POA: Diagnosis not present

## 2019-01-12 DIAGNOSIS — F411 Generalized anxiety disorder: Secondary | ICD-10-CM | POA: Diagnosis not present

## 2019-01-18 DIAGNOSIS — F4322 Adjustment disorder with anxiety: Secondary | ICD-10-CM | POA: Diagnosis not present

## 2019-01-18 DIAGNOSIS — F411 Generalized anxiety disorder: Secondary | ICD-10-CM | POA: Diagnosis not present

## 2019-01-22 ENCOUNTER — Other Ambulatory Visit: Payer: Self-pay | Admitting: Family Medicine

## 2019-01-22 NOTE — Telephone Encounter (Signed)
Ok to refill??  Last office visit/ refill 12/29/2018.

## 2019-01-27 ENCOUNTER — Telehealth: Payer: Self-pay | Admitting: *Deleted

## 2019-01-27 NOTE — Telephone Encounter (Signed)
Pt needs OV first , never came to get labs either

## 2019-01-27 NOTE — Telephone Encounter (Signed)
Call placed to patient and patient made aware.   Requested appointment on 01/28/2019. Scheduled with PA.

## 2019-01-27 NOTE — Telephone Encounter (Signed)
Received call from patient. [phone]~ telephone.   Reports that he is interested in smoking cessation. States that he is currently smoking approximately 2 packs per day.   Reports that he has tried Chantix in the past, but noted intense vivid nightmares. States that he was also going through a very stressful time in his life and thinks that may have added to the nightmares.   Requested prescription for Chantix. States that he is willing to try it again. If nightmares recur, he will stop medication and contact office.   Patient requested prescription for nicotine patches if Chantix is not appropriate.

## 2019-01-27 NOTE — Telephone Encounter (Signed)
Per PCP, patient needs to see PCP.   Call placed to patient to make aware and re-schedule appointment. States that he will discuss smoking cessation with his counselor on Friday.   Reports he will try to come in next week for labs.

## 2019-01-27 NOTE — Telephone Encounter (Signed)
Patient made aware.   Patient is currently very indecisive as to ability to schedule an appointment.

## 2019-01-27 NOTE — Telephone Encounter (Signed)
He NEEDS to schedule OV with me, advise I wont be able to refill any of his meds if he doesn't come

## 2019-01-28 ENCOUNTER — Ambulatory Visit: Payer: Self-pay | Admitting: Family Medicine

## 2019-01-28 DIAGNOSIS — F4322 Adjustment disorder with anxiety: Secondary | ICD-10-CM | POA: Diagnosis not present

## 2019-01-28 DIAGNOSIS — F411 Generalized anxiety disorder: Secondary | ICD-10-CM | POA: Diagnosis not present

## 2019-02-05 ENCOUNTER — Ambulatory Visit (INDEPENDENT_AMBULATORY_CARE_PROVIDER_SITE_OTHER): Payer: Medicaid Other | Admitting: Family Medicine

## 2019-02-05 ENCOUNTER — Other Ambulatory Visit: Payer: Self-pay

## 2019-02-05 VITALS — BP 128/72 | HR 80 | Temp 98.9°F | Resp 16 | Ht 72.0 in | Wt 165.8 lb

## 2019-02-05 DIAGNOSIS — B9620 Unspecified Escherichia coli [E. coli] as the cause of diseases classified elsewhere: Secondary | ICD-10-CM | POA: Diagnosis not present

## 2019-02-05 DIAGNOSIS — F411 Generalized anxiety disorder: Secondary | ICD-10-CM | POA: Insufficient documentation

## 2019-02-05 DIAGNOSIS — R972 Elevated prostate specific antigen [PSA]: Secondary | ICD-10-CM

## 2019-02-05 DIAGNOSIS — F429 Obsessive-compulsive disorder, unspecified: Secondary | ICD-10-CM | POA: Diagnosis not present

## 2019-02-05 DIAGNOSIS — F419 Anxiety disorder, unspecified: Secondary | ICD-10-CM

## 2019-02-05 DIAGNOSIS — N390 Urinary tract infection, site not specified: Secondary | ICD-10-CM | POA: Diagnosis not present

## 2019-02-05 DIAGNOSIS — F331 Major depressive disorder, recurrent, moderate: Secondary | ICD-10-CM | POA: Diagnosis not present

## 2019-02-05 DIAGNOSIS — F329 Major depressive disorder, single episode, unspecified: Secondary | ICD-10-CM | POA: Insufficient documentation

## 2019-02-05 DIAGNOSIS — F418 Other specified anxiety disorders: Secondary | ICD-10-CM | POA: Insufficient documentation

## 2019-02-05 LAB — URINALYSIS, ROUTINE W REFLEX MICROSCOPIC
Bilirubin Urine: NEGATIVE
Glucose, UA: NEGATIVE
Hgb urine dipstick: NEGATIVE
Ketones, ur: NEGATIVE
Leukocytes,Ua: NEGATIVE
Nitrite: NEGATIVE
Protein, ur: NEGATIVE
Specific Gravity, Urine: 1.02 (ref 1.001–1.03)
pH: 6 (ref 5.0–8.0)

## 2019-02-05 MED ORDER — FLUOXETINE HCL 20 MG PO TABS: 20.0000 mg | ORAL_TABLET | Freq: Every day | ORAL | 3 refills | Status: DC

## 2019-02-05 NOTE — Patient Instructions (Addendum)
Start prozac 20mg  once a day  Referral to psychiatry  F/U 2 months

## 2019-02-05 NOTE — Progress Notes (Signed)
Subjective:    Patient ID: Christopher Wyatt, male    DOB: 26-Dec-1975, 43 y.o.   MRN: [id number]  Patient presents for Medication Management  Pt here to f/u medications, has been very anxious and had some depressed mood due to relationship issues. See previous notes, fiancee found out about his infidelty recently. He is on klonopin, also given trazodone for sleep, often has to take 2 tablets to sleep. Now following with  Cherlynn Kaiser , [place] [facility], Successful  Transitions  In couple and indivual therapy  Lost 10lbs since march, has no appetite, feels depressed, doesn't feel like eating , states he will eat in the morning and then go all day and something small before dinner Denies SI  He admits when he is able to see fiancee and have good conversations with her, he feels great, he sleeps fine even without the meds   Asked about OCD/ADD    , states his therapist feels he may have one of these. He admits for many years things have to be a certain a way or he feels uneasy and anxious. Described placing his water we gave him down, a certain way multiple times. States he has had some tics in the past as well   Doesn't always take xanax twice a day  Also due for repeat PSA, elevated PSA in setting of presumed prostatitis, he is afraid of blood draws so was not coming in to have it done   Review Of Systems:  GEN- denies fatigue, fever, +weight loss,weakness, recent illness HEENT- denies eye drainage, change in vision, nasal discharge, CVS- denies chest pain, palpitations RESP- denies SOB, cough, wheeze ABD- denies N/V, change in stools, abd pain GU- denies dysuria, hematuria, dribbling, incontinence MSK- denies joint pain, muscle aches, injury Neuro- denies headache, dizziness, syncope, seizure activity       Objective:    BP 128/72   Pulse 80   Temp 98.9 F (37.2 C)   Resp 16   Ht 6' (1.829 m)   Wt 165 lb 12.8 oz (75.2 kg)   SpO2 96%   BMI 22.49 kg/m  GEN- NAD, alert and  oriented x3 HEENT- PERRL, EOMI, non injected sclera, pink conjunctiva, MMM, oropharynx clear Neck- Supple, no thyromegaly CVS- RRR, no murmur RESP-CTAB ABD-NABS,soft,NT,ND GU- deferred Psych- very pleasant, tangential, not depressed appearing, anxious appearing, no SI, well groomed, good eye contact, no psychomotor tics noted Pulses- Radial  2+        Assessment & Plan:      Problem List Items Addressed This Visit      Unprioritized   Anxiety - Primary    Chronic anxiety and depression that is resurfaced in light of recent issues with his fiancee of 12 years He did initiate therapy for himself and couples therapy which is a great start Often his weight drops off when stress, see previous old notes Recommend he start prozac 20mg  once a day, the trazodone does not help with anxiety symptoms and he keeps asking for higher doses of benzo which I do not feel is the best for him long term He has some OCD tendencies he described but no formal diagnosis Discussed seeing psychiatry, he is willing to do so      Relevant Medications   FLUoxetine (PROZAC) 20 MG tablet   Other Relevant Orders   Ambulatory referral to Psychiatry   MDD (major depressive disorder)   Relevant Medications   FLUoxetine (PROZAC) 20 MG tablet   Other Relevant Orders  Ambulatory referral to Psychiatry   OCD (obsessive compulsive disorder)   Relevant Medications   FLUoxetine (PROZAC) 20 MG tablet   Other Relevant Orders   Ambulatory referral to Psychiatry    Other Visit Diagnoses    Elevated PSA       RECHECK UA/CULTURE ensure infection gone as he spread out antibiotics of weeks      Note: This dictation was prepared with Dragon dictation along with smaller phrase technology. Any transcriptional errors that result from this process are unintentional.

## 2019-02-06 LAB — PSA: PSA: 2.3 ng/mL

## 2019-02-07 ENCOUNTER — Encounter: Payer: Self-pay | Admitting: Family Medicine

## 2019-02-07 NOTE — Assessment & Plan Note (Signed)
Chronic anxiety and depression that is resurfaced in light of recent issues with his fiancee of 12 years He did initiate therapy for himself and couples therapy which is a great start Often his weight drops off when stress, see previous old notes Recommend he start prozac 20mg  once a day, the trazodone does not help with anxiety symptoms and he keeps asking for higher doses of benzo which I do not feel is the best for him long term He has some OCD tendencies he described but no formal diagnosis Discussed seeing psychiatry, he is willing to do so

## 2019-02-19 ENCOUNTER — Other Ambulatory Visit: Payer: Self-pay | Admitting: Family Medicine

## 2019-02-19 NOTE — Telephone Encounter (Signed)
Ok to refill??  Last office visit 02/05/2019.  Last refill 01/22/2019.

## 2019-02-23 DIAGNOSIS — F411 Generalized anxiety disorder: Secondary | ICD-10-CM | POA: Diagnosis not present

## 2019-02-23 DIAGNOSIS — F4322 Adjustment disorder with anxiety: Secondary | ICD-10-CM | POA: Diagnosis not present

## 2019-03-02 DIAGNOSIS — F411 Generalized anxiety disorder: Secondary | ICD-10-CM | POA: Diagnosis not present

## 2019-03-02 DIAGNOSIS — F4322 Adjustment disorder with anxiety: Secondary | ICD-10-CM | POA: Diagnosis not present

## 2019-03-23 DIAGNOSIS — Z79899 Other long term (current) drug therapy: Secondary | ICD-10-CM | POA: Diagnosis not present

## 2019-03-23 DIAGNOSIS — Z5181 Encounter for therapeutic drug level monitoring: Secondary | ICD-10-CM | POA: Diagnosis not present

## 2019-03-26 NOTE — Progress Notes (Signed)
Virtual Visit via Video Note  I connected with Christopher Wyatt on 04/01/19 at 10:00 AM EDT by a video enabled telemedicine application and verified that I am speaking with the correct person using two identifiers.   I discussed the limitations of evaluation and management by telemedicine and the availability of in person appointments. The patient expressed understanding and agreed to proceed.   I discussed the assessment and treatment plan with the patient. The patient was provided an opportunity to ask questions and all were answered. The patient agreed with the plan and demonstrated an understanding of the instructions.   The patient was advised to call back or seek an in-person evaluation if the symptoms worsen or if the condition fails to improve as anticipated.  I provided 50 minutes of non-face-to-face time during this encounter.   Norman Clay, MD    Psychiatric Initial Adult Assessment   Patient Identification: Christopher Wyatt MRN:  [id number] Date of Evaluation:  04/01/2019 Referral Source: Dr. Vic Blackbird Chief Complaint:  "I have this since 43 (year old)" Visit Diagnosis:    ICD-10-CM   1. Mixed obsessional thoughts and acts  F42.2     History of Present Illness:   Christopher Wyatt is a 43 y.o. year old male with a history of anxiety, OCD, who is referred for anxiety.   He states that he was recommended by his PCP to be seen as he might have mild case of Tourette and ADHD.  On top of these symptoms, he also reports he has been struggling with anxiety since 43 year old.  He states that the symptoms have not changed for many years, he notices it worsens when he feels stressed especially due to financial strain.  He has been unemployed since April due to pandemic.  However, he has been able to manage things relatively well he also received a packet from work to pay bills. He has a good relationship with his son (he has custody), who stays at his parents house. He is in  relationship with his girlfriend of 12.5 years. Although they broke up in April due to his infidelity, they got back together in May.   He describes the following symptoms.  OCD- He needs glass to be placed in certain way, He will pour fresh water when he found a dust in glass. He repeatedly checks lights, doors, bedcovers multiple of times. He has a thought that he might get him if he does not close the door. He is afraid that something might happen if he does not touch door know three times.   Anxiety- He feels anxious, tense at times. He is worried about his parents' well being at times. He feels irritable at times. He has decreased appetite for 43-45 years. He has panic attacks almost daily.   MDD- he feels mildly depressed and fatigue at times. He denies SI. He has fair sleep (at least 7 hours),   Tic- he reportedly move his neck since child. (No such behavior was observed during the interview). No phonic tics.   ADHD- never in IEP. He has inattention, difficulty in organization.   Substance- he drink two beers per night. He denies DUI/DWI. He smoke cigarettes daily. He denies substance use.   Medication- he has not taking any medication including fluoxetine as he was afraid of its potential side effect.    Xanax 0.5 mg 45 tabs for 43 days, 02/19/2019  Wt Readings from Last 3 Encounters:  02/05/19 165 lb 12.8 oz (  75.2 kg)  12/03/18 175 lb 6.4 oz (79.6 kg)  06/11/18 180 lb (81.6 kg)    Associated Signs/Symptoms: Depression Symptoms:  depressed mood, fatigue, (Hypo) Manic Symptoms:  denies decreased need for sleep, euphoria Anxiety Symptoms:  Excessive Worry, Panic Symptoms, Obsessive Compulsive Symptoms:   Checking,, Psychotic Symptoms:  denies AH, VH, paranoia PTSD Symptoms: Negative  Past Psychiatric History:  Outpatient: had a couple therapist in [place] Psychiatry admission: denies Previous suicide attempt: denies Past trials of medication: Paxil, citalopram, lexapro,  venlafaxine, bupropion, Xanax (memory loss), clonazepam (drowsy) History of violence: denies   Previous Psychotropic Medications: Yes   Substance Abuse History in the last 12 months:  No.  Consequences of Substance Abuse: NA  Past Medical History:  Past Medical History:  Diagnosis Date  . Anxiety    Chronic  . OCD (obsessive compulsive disorder)    No past surgical history on file.  Family Psychiatric History:  As below  Family History:  Family History  Problem Relation Age of Onset  . Depression Mother   . Depression Sister   . Bipolar disorder Paternal Uncle   . Schizophrenia Paternal Uncle     Social History:   Social History   Socioeconomic History  . Marital status: Significant Other    Spouse name: Not on file  . Number of children: Not on file  . Years of education: Not on file  . Highest education level: Not on file  Occupational History  . Not on file  Social Needs  . Financial resource strain: Not on file  . Food insecurity    Worry: Not on file    Inability: Not on file  . Transportation needs    Medical: Not on file    Non-medical: Not on file  Tobacco Use  . Smoking status: Current Every Day Smoker    Packs/day: 0.50    Types: Cigarettes  . Smokeless tobacco: Current User    Types: Snuff  Substance and Sexual Activity  . Alcohol use: Yes    Alcohol/week: 7.0 standard drinks    Types: 7 Cans of beer per week    Comment: daily  . Drug use: No  . Sexual activity: Yes  Lifestyle  . Physical activity    Days per week: Not on file    Minutes per session: Not on file  . Stress: Not on file  Relationships  . Social Herbalist on phone: Not on file    Gets together: Not on file    Attends religious service: Not on file    Active member of club or organization: Not on file    Attends meetings of clubs or organizations: Not on file    Relationship status: Not on file  Other Topics Concern  . Not on file  Social History Narrative   . Not on file    Additional Social History:  Divorced after 9 months of marriage, he has a full custody of his son (age 43) He lives with his girlfriend of 12.5 years.  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 Paroxetine Hcl     Erectile  dysfunction    Metabolic Disorder Labs: Lab Results  Component Value Date   HGBA1C 5.2 12/03/2018   MPG 103 12/03/2018   No results found for: PROLACTIN Lab Results  Component Value Date   CHOL 137 03/26/2016   TRIG 111 03/26/2016   HDL 61 03/26/2016   CHOLHDL 2.2 03/26/2016   VLDL 22 03/26/2016   LDLCALC 54 03/26/2016   Lab Results  Component Value Date   TSH 1.02 12/03/2018    Therapeutic Level Labs: No results found for: LITHIUM No results found for: CBMZ No results found for: VALPROATE  Current Medications: Current Outpatient Medications  Medication Sig Dispense Refill  . ALPRAZolam (XANAX) 0.5 MG tablet TAKE 1 TABLET(0.5 MG) BY MOUTH TWICE DAILY AS NEEDED FOR ANXIETY 45 tablet 1  . FLUoxetine (PROZAC) 20 MG tablet Take 1 tablet (20 mg total) by mouth daily. 30 tablet 3  . traZODone (DESYREL) 50 MG tablet Take 0.5-1 tablets (25-50 mg total) by mouth at bedtime as needed for sleep. 30 tablet 3   No current facility-administered medications for this visit.     Musculoskeletal: Strength & Muscle Tone: N/A Gait & Station: N/A Patient leans: N/A  Psychiatric Specialty Exam: Review of Systems  All other systems reviewed and are negative.   There were no vitals taken for this visit.There is no height or weight on file to calculate BMI.  General Appearance: Fairly  Groomed  Eye Contact:  Good  Speech:  Clear and Coherent  Volume:  Normal  Mood:  Anxious  Affect:  Appropriate, Congruent and Full Range  Thought Process:  Coherent  Orientation:  Full (Time, Place, and Person)  Thought Content:  Logical  Suicidal Thoughts:  No  Homicidal Thoughts:  No  Memory:  Immediate;   Good  Judgement:  Good  Insight:  Fair  Psychomotor Activity:  Normal  Concentration:  Concentration: Good and Attention Span: Good  Recall:  Good  Fund of Knowledge:Good  Language: Good  Akathisia:  No  Handed:  Right  AIMS (if indicated):  not done  Assets:  Communication Skills Desire for Improvement  ADL's:  Intact  Cognition: WNL  Sleep:  Fair   Screenings: PHQ2-9     Office Visit from 06/11/2018 in [place]  PHQ-2 Total Score  0      Assessment and Plan:  Christopher Wyatt is a 43 y.o. year old male with a history of anxiety, OCD, who is referred for anxiety.   # OCD with fair insight He reports symptoms of OCD with shaking and obsessive thoughts for many years, which occasionally worsens in the context of stress.  Psychosocial stressors includes financial strain, unemployment, and relationship issues.  He has not started fluoxetine, which was prescribed by PCP.  He is now willing to try this medication to target OCD.  Discussed potential sexual side effect.  Will consider mirtazapine in the future if he experiences any sexual side effect which he experience with other antidepressants.  He will greatly benefit from CBT; will make a referral.   # Inattention It is difficult to discern whether he has underlying ADHD given his ongoing anxiety symptoms. Will prioritize treatment of OCD/anxiety while continue to evaluate his inattention.   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 No known phonic tic. Will continue to monitor  Plan 1. Start fluoxetine 20 mg daily  2. Next appointment: 8/20 at 9:30 for 30 mins,  video  The patient demonstrates  the following risk factors for suicide: Chronic risk factors for suicide include: psychiatric disorder of anxiety. Acute risk factors for suicide include: N/A. Protective factors for this patient include: positive social support, responsibility to others (children, family), coping skills and hope for the future. Considering these factors, the overall suicide risk at this point appears to be low. Patient is appropriate for outpatient follow up.   Norman Clay, MD 7/23/202010:44 AM

## 2019-04-01 ENCOUNTER — Other Ambulatory Visit: Payer: Self-pay

## 2019-04-01 ENCOUNTER — Encounter (HOSPITAL_COMMUNITY): Payer: Self-pay | Admitting: Psychiatry

## 2019-04-01 ENCOUNTER — Ambulatory Visit (INDEPENDENT_AMBULATORY_CARE_PROVIDER_SITE_OTHER): Payer: Medicaid Other | Admitting: Psychiatry

## 2019-04-01 DIAGNOSIS — F422 Mixed obsessional thoughts and acts: Secondary | ICD-10-CM

## 2019-04-01 NOTE — Patient Instructions (Signed)
1. Start fluoxetine 20 mg daily  2. Next appointment: 8/20 at 9:30

## 2019-04-12 ENCOUNTER — Other Ambulatory Visit: Payer: Self-pay

## 2019-04-12 ENCOUNTER — Ambulatory Visit: Payer: Medicaid Other | Admitting: Family Medicine

## 2019-04-14 ENCOUNTER — Other Ambulatory Visit: Payer: Self-pay

## 2019-04-14 ENCOUNTER — Ambulatory Visit (INDEPENDENT_AMBULATORY_CARE_PROVIDER_SITE_OTHER): Payer: Medicaid Other | Admitting: Family Medicine

## 2019-04-14 ENCOUNTER — Encounter: Payer: Self-pay | Admitting: Family Medicine

## 2019-04-14 VITALS — BP 128/68 | HR 100 | Temp 99.0°F | Resp 14 | Ht 72.0 in | Wt 169.0 lb

## 2019-04-14 DIAGNOSIS — F419 Anxiety disorder, unspecified: Secondary | ICD-10-CM | POA: Diagnosis not present

## 2019-04-14 DIAGNOSIS — M9901 Segmental and somatic dysfunction of cervical region: Secondary | ICD-10-CM | POA: Diagnosis not present

## 2019-04-14 DIAGNOSIS — J312 Chronic pharyngitis: Secondary | ICD-10-CM | POA: Diagnosis not present

## 2019-04-14 DIAGNOSIS — M9902 Segmental and somatic dysfunction of thoracic region: Secondary | ICD-10-CM | POA: Diagnosis not present

## 2019-04-14 DIAGNOSIS — M542 Cervicalgia: Secondary | ICD-10-CM | POA: Diagnosis not present

## 2019-04-14 DIAGNOSIS — M546 Pain in thoracic spine: Secondary | ICD-10-CM | POA: Diagnosis not present

## 2019-04-14 NOTE — Progress Notes (Signed)
   Subjective:    Patient ID: Christopher Wyatt, male    DOB: [birth year], 43 y.o.   MRN: [id number]  Patient presents for Follow-up (has not started taking Prozac yet)  Pt here for follow up per report, but as we discussed with him on phone he wanted me to sign a paper saying he could have his windows tinted. Initially stated it was because his eyes was senstitive, advised eye doctor needs to complete if needed, then stated his anxiety worsens with the bright lights and sunlight- recommend psychiatrist fill out if this is truly the case.   - note he has had tint on windows for years, it is above the legal limit could not have car inspected, they told him to get a form to fill out.  He saw psychiatry recently they also recommended he take the prozac he has not taken. He is also not taking the trazodone. States he has been watching you tube videos and and is afraid of taking the meds but feels anxious and jittery all the time  He also states he was going to ENT for his throat, feels like it closes up on him at times, has occ drainage, occurs mostly when he drinks energy drinks? Has seen ENT in the past for various reasons  note he called back , had not been seen in > 5 years and needs a referral     Review Of Systems:  GEN- denies fatigue, fever, weight loss,weakness, recent illness HEENT- denies eye drainage, change in vision, nasal discharge, CVS- denies chest pain, palpitations RESP- denies SOB, cough, wheeze ABD- denies N/V, change in stools, abd pain GU- denies dysuria, hematuria, dribbling, incontinence MSK- denies joint pain, muscle aches, injury Neuro- denies headache, dizziness, syncope, seizure activity       Objective:    BP 128/68   Pulse 100   Temp 99 F (37.2 C) (Oral)   Resp 14   Ht 6' (1.829 m)   Wt 169 lb (76.7 kg)   SpO2 97%   BMI 22.92 kg/m  GEN- NAD, alert and oriented x3 HEENT- PERRL, EOMI, non injected sclera, pink conjunctiva, MMM, oropharynx  clear Neck- Supple, no thyromegaly CVS- RRR, no murmur RESP-CTAB Psych-anxious, but not depressed, well groomed, normal speech          Assessment & Plan:      Problem List Items Addressed This Visit      Unprioritized   Anxiety    Recommend he try the prozac, he just wants to take the benzo but symptoms are not controlled I will NOT complete forms for any window tint I dont have a medical necessity for this he can discuss with is psychiatrist  I think his sore throat issues are interesting,advised not to drink energy drinks as well if this is only thing that causes problem. He would still like to see ENT, will send referral again       Other Visit Diagnoses    Chronic sore throat    -  Primary   Relevant Orders   Ambulatory referral to ENT      Note: This dictation was prepared with Dragon dictation along with smaller phrase technology. Any transcriptional errors that result from this process are unintentional.

## 2019-04-14 NOTE — Patient Instructions (Addendum)
Take the prozac You can discuss the window tinting with your psychiatrist. F/U 6 months for Physical

## 2019-04-15 ENCOUNTER — Encounter: Payer: Self-pay | Admitting: Family Medicine

## 2019-04-15 NOTE — Assessment & Plan Note (Signed)
Recommend he try the prozac, he just wants to take the benzo but symptoms are not controlled I will NOT complete forms for any window tint I dont have a medical necessity for this he can discuss with is psychiatrist  I think his sore throat issues are interesting,advised not to drink energy drinks as well if this is only thing that causes problem. He would still like to see ENT, will send referral again

## 2019-04-26 NOTE — Progress Notes (Signed)
Virtual Visit via Video Note  I connected with Christopher Wyatt on 05/03/19 at  8:00 AM EDT by a video enabled telemedicine application and verified that I am speaking with the correct person using two identifiers.   I discussed the limitations of evaluation and management by telemedicine and the availability of in person appointments. The patient expressed understanding and agreed to proceed.    I discussed the assessment and treatment plan with the patient. The patient was provided an opportunity to ask questions and all were answered. The patient agreed with the plan and demonstrated an understanding of the instructions.   The patient was advised to call back or seek an in-person evaluation if the symptoms worsen or if the condition fails to improve as anticipated.  I provided 25 minutes of non-face-to-face time during this encounter.   Norman Clay, MD    [facility] MD/PA/NP OP Progress Note  05/03/2019 8:44 AM Christopher Wyatt  MRN:  [id number]  Chief Complaint:  Chief Complaint    Anxiety; Follow-up     HPI:  This is a follow-up appointment for OCD.  He states that started fluoxetine yet. He was concerned of its potential side effect. He also wondered if he can take fluoxetine even if he does not eat. He reports decreased appetite.  He also states that he tends to spit through the day, although he denies any intention to lose his weight. He talks about an episode of him spitting after he saw a butterfly close to his face. He felt disgusted and could not eat dinner as he was imagining him eating butterfly. He thanked the letter about tinted window. He states that he gets easily anxious when there is changing night while he is driving.  He cannot wear sunglasses as he has claustrophobia. He maws yard and goes to his parents house regularly. He hopes to get a job, which he believes will help him to have daily routine.  He reports constant anxiety; he tends to think what he would do if he  were to lose his parents. He feels terrified to go to [place] with his family/son as he is afraid that something bad will happen.  He has fair sleep.  He feels depressed, stating that he is worried about his finances.  He has fair concentration.  He denies any change in his weight.  He denies SI, HI or any violent thoughts.  He has panic attacks almost every day. He checks k lights, doors, bedcovers  3-5 times every day.    Substance- He drinks two 12 oz beers ever day, denies drug use  171 lbs- he wants to be 200 lbs  Visit Diagnosis:    ICD-10-CM   1. Mixed obsessional thoughts and acts  F42.2     Past Psychiatric History: Please see initial evaluation for full details. I have reviewed the history. No updates at this time.     Past Medical History:  Past Medical History:  Diagnosis Date  . Anxiety    Chronic  . OCD (obsessive compulsive disorder)    History reviewed. No pertinent surgical history.  Family Psychiatric History: Please see initial evaluation for full details. I have reviewed the history. No updates at this time.     Family History:  Family History  Problem Relation Age of Onset  . Depression Mother   . Depression Sister   . Bipolar disorder Paternal Uncle   . Schizophrenia Paternal Uncle   . Alcohol abuse Maternal Uncle  Social History:  Social History   Socioeconomic History  . Marital status: Significant Other    Spouse name: Not on file  . Number of children: Not on file  . Years of education: Not on file  . Highest education level: Not on file  Occupational History  . Not on file  Social Needs  . Financial resource strain: Not on file  . Food insecurity    Worry: Not on file    Inability: Not on file  . Transportation needs    Medical: Not on file    Non-medical: Not on file  Tobacco Use  . Smoking status: Current Every Day Smoker    Packs/day: 0.50    Types: Cigarettes  . Smokeless tobacco: Current User    Types: Snuff   Substance and Sexual Activity  . Alcohol use: Yes    Alcohol/week: 7.0 standard drinks    Types: 7 Cans of beer per week    Comment: daily  . Drug use: No  . Sexual activity: Yes  Lifestyle  . Physical activity    Days per week: Not on file    Minutes per session: Not on file  . Stress: Not on file  Relationships  . Social Herbalist on phone: Not on file    Gets together: Not on file    Attends religious service: Not on file    Active member of club or organization: Not on file    Attends meetings of clubs or organizations: Not on file    Relationship status: Not on file  Other Topics Concern  . Not on file  Social History Narrative  . Not on file    Allergies:  Allergies  Allergen Reactions  . Paroxetine Hcl     Erectile dysfunction    Metabolic Disorder Labs: Lab Results  Component Value Date   HGBA1C 5.2 12/03/2018   MPG 103 12/03/2018   No results found for: PROLACTIN Lab Results  Component Value Date   CHOL 137 03/26/2016   TRIG 111 03/26/2016   HDL 61 03/26/2016   CHOLHDL 2.2 03/26/2016   VLDL 22 03/26/2016   LDLCALC 54 03/26/2016   Lab Results  Component Value Date   TSH 1.02 12/03/2018   TSH 1.39 03/26/2016    Therapeutic Level Labs: No results found for: LITHIUM No results found for: VALPROATE No components found for:  CBMZ  Current Medications: Current Outpatient Medications  Medication Sig Dispense Refill  . ALPRAZolam (XANAX) 0.5 MG tablet TAKE 1 TABLET(0.5 MG) BY MOUTH TWICE DAILY AS NEEDED FOR ANXIETY 45 tablet 1  . FLUoxetine (PROZAC) 20 MG tablet Take 1 tablet (20 mg total) by mouth daily. (Patient not taking: Reported on 04/01/2019) 30 tablet 3   No current facility-administered medications for this visit.      Musculoskeletal: Strength & Muscle Tone: N/A Gait & Station: N/A Patient leans: N/A  Psychiatric Specialty Exam: Review of Systems  Psychiatric/Behavioral: Negative for depression, hallucinations, memory  loss, substance abuse and suicidal ideas. The patient is nervous/anxious. The patient does not have insomnia.   All other systems reviewed and are negative.   There were no vitals taken for this visit.There is no height or weight on file to calculate BMI.  General Appearance: Fairly Groomed  Eye Contact:  Good  Speech:  Clear and Coherent  Volume:  Normal  Mood:  Anxious  Affect:  Appropriate, Congruent and Restricted  Thought Process:  Coherent  Orientation:  Full (Time, Place,  and Person)  Thought Content: Logical and Obsessions   Suicidal Thoughts:  No  Homicidal Thoughts:  No  Memory:  Immediate;   Good  Judgement:  Good  Insight:  Fair  Psychomotor Activity:  Normal  Concentration:  Concentration: Good and Attention Span: Good  Recall:  Good  Fund of Knowledge: Good  Language: Good  Akathisia:  No  Handed:  Right  AIMS (if indicated): not done  Assets:  Communication Skills Desire for Improvement  ADL's:  Intact  Cognition: WNL  Sleep:  Fair   Screenings: PHQ2-9     Office Visit from 06/11/2018 in [place]  PHQ-2 Total Score  0       Assessment and Plan:  SWANSON FARNELL is a 43 y.o. year old male with a history of OCD, anxiety, who presents for follow up appointment for Mixed obsessional thoughts and acts  # OCD with fair insight He continues to have OCD symptoms with obsessional thoughts and acts. Psychosocial stressors includes financial strain, unemployment and relationship issues. He has not started fluoxetine due to concern about its potential side effect.  Provided  psychoeducation again regarding the benefit/risk of medication and its importance of adherence to medication.  Will start fluoxetine to target OCD and anxiety.  Discussed potential sexual side effect.  Will consider mirtazapine in the future if he has limited benefit from fluoxetine given its less sexual side effect and influence on his appetite.  He will greatly benefit from  CBT; will make referral again.   # Inattention It is difficult to discern whether he has underlying ADHD given his ongoing anxiety symptoms. Will prioritize treatment of OCD/anxiety while continue to evaluate his inattention.   # r/o Tic Although he reports history of motor tick (neck) for many years, he has not demonstrated any during the visit. No known phonic tic. Will continue to monitor.   Plan 1. Start fluoxetine 20 mg daily  2. Next appointment: 9/30 at 11:20 for 30 mins, video 3. Referral to therapy again  The patient demonstrates the following risk factors for suicide: Chronic risk factors for suicide include: psychiatric disorder of anxiety. Acute risk factors for suicide include: N/A. Protective factors for this patient include: positive social support, responsibility to others (children, family), coping skills and hope for the future. Considering these factors, the overall suicide risk at this point appears to be low. Patient is appropriate for outpatient follow up.  The duration of this appointment visit was 25 minutes of non face-to-face time with the patient.  Greater than 50% of this time was spent in counseling, explanation of  diagnosis, planning of further management, and coordination of care.  Norman Clay, MD 05/03/2019, 8:44 AM

## 2019-04-29 ENCOUNTER — Ambulatory Visit (HOSPITAL_COMMUNITY): Payer: Medicaid Other | Admitting: Psychiatry

## 2019-05-03 ENCOUNTER — Ambulatory Visit (INDEPENDENT_AMBULATORY_CARE_PROVIDER_SITE_OTHER): Payer: Medicaid Other | Admitting: Psychiatry

## 2019-05-03 ENCOUNTER — Encounter (HOSPITAL_COMMUNITY): Payer: Self-pay | Admitting: Psychiatry

## 2019-05-03 ENCOUNTER — Other Ambulatory Visit: Payer: Self-pay

## 2019-05-03 DIAGNOSIS — F422 Mixed obsessional thoughts and acts: Secondary | ICD-10-CM

## 2019-05-03 DIAGNOSIS — F419 Anxiety disorder, unspecified: Secondary | ICD-10-CM | POA: Diagnosis not present

## 2019-05-03 NOTE — Patient Instructions (Signed)
1. Start fluoxetine 20 mg daily  2. Next appointment: 9/30 at 11:20

## 2019-05-07 ENCOUNTER — Other Ambulatory Visit: Payer: Self-pay

## 2019-05-07 ENCOUNTER — Ambulatory Visit (HOSPITAL_COMMUNITY): Payer: Medicaid Other | Admitting: Psychiatry

## 2019-05-07 ENCOUNTER — Telehealth (HOSPITAL_COMMUNITY): Payer: Self-pay | Admitting: Psychiatry

## 2019-05-07 NOTE — Telephone Encounter (Signed)
Therapist attempted to contact patient twice via text for scheduled appointment, no response. Therapist called patient and left message indicating attempt and requesting patient call office.

## 2019-05-19 DIAGNOSIS — R070 Pain in throat: Secondary | ICD-10-CM | POA: Diagnosis not present

## 2019-05-19 DIAGNOSIS — K219 Gastro-esophageal reflux disease without esophagitis: Secondary | ICD-10-CM | POA: Diagnosis not present

## 2019-05-28 DIAGNOSIS — M9902 Segmental and somatic dysfunction of thoracic region: Secondary | ICD-10-CM | POA: Diagnosis not present

## 2019-05-28 DIAGNOSIS — M9901 Segmental and somatic dysfunction of cervical region: Secondary | ICD-10-CM | POA: Diagnosis not present

## 2019-05-28 DIAGNOSIS — M542 Cervicalgia: Secondary | ICD-10-CM | POA: Diagnosis not present

## 2019-05-28 DIAGNOSIS — M546 Pain in thoracic spine: Secondary | ICD-10-CM | POA: Diagnosis not present

## 2019-05-31 ENCOUNTER — Telehealth: Payer: Self-pay

## 2019-05-31 NOTE — Telephone Encounter (Signed)
Pt called and wants to know if you can up his dose on the prozac hoping it will level him out. No other reason given. Please advise.

## 2019-05-31 NOTE — Telephone Encounter (Signed)
He is now being seen by Dr. Modesta Messing -psychiatry, he needs to call them about medication changes if needs help with is anxiety , or he can schedule and OV if not following up with them

## 2019-06-01 ENCOUNTER — Telehealth (HOSPITAL_COMMUNITY): Payer: Self-pay | Admitting: Psychiatry

## 2019-06-01 NOTE — Telephone Encounter (Signed)
Pt will call psychiatry.

## 2019-06-01 NOTE — Telephone Encounter (Signed)
Please advise him to stay on the same dose at this time given it usually takes several weeks for medication to be fully effective. Will discuss uptitration at his next visit in several days (if appropriate).

## 2019-06-03 NOTE — Progress Notes (Signed)
Virtual Visit via Video Note  I connected with Christopher Wyatt on 06/09/19 at 11:20 AM EDT by a video enabled telemedicine application and verified that I am speaking with the correct person using two identifiers.   I discussed the limitations of evaluation and management by telemedicine and the availability of in person appointments. The patient expressed understanding and agreed to proceed.     I discussed the assessment and treatment plan with the patient. The patient was provided an opportunity to ask questions and all were answered. The patient agreed with the plan and demonstrated an understanding of the instructions.   The patient was advised to call back or seek an in-person evaluation if the symptoms worsen or if the condition fails to improve as anticipated.  I provided 25 minutes of non-face-to-face time during this encounter.   Christopher Clay, MD     [facility] MD/PA/NP OP Progress Note  06/09/2019 12:08 PM [name]  MRN:  [id number]  Chief Complaint:  Chief Complaint    Anxiety; Follow-up     HPI:  This is a follow-up appointment for OCD.  He states that he wonders if he can up the dose of fluoxetine as he has not noticed any difference after starting the medication. He continues to double check things such as daughters on pickup trucks, or keys.  He would wash hands at least 10 times per day. Although he states that "it sounds weird," he has decreased appetite after he saw a skunk. He spits many times in the day after he saw a skunk while he was driving. He thinks that he did not drive fast enough to pass by. He thought that the smell will be getting after him. He could not eat a pizza today due to the thought, although he did not have any issues yesterday. He has thoughts of "something gonna happen to loved one." He has fair relationship with his girlfriend. He thinks that they do not communicate well. He states that he cheated on her several months ago as he wanted to  have "companionship." He does not like the way it is and would like to make change in the relationship. He sleeps well.  He denies feeling depressed.  He denies SI.  He feels anxious and tense.  He has panic attacks at least a few times when he "cannot control the situation." He talks about an example of him giving a card with $200 to his son for his 44 year old birthday. He regrets that he should have given some present to be unwrapped as he used to like it when he was a child. He denies irritability. He drinks two of 12 oz of beers or 1 glass of wine. He denies drug use. He feels slightly fatigue later in the day after starting fluoxetine.   Visit Diagnosis:    ICD-10-CM   1. Mixed obsessional thoughts and acts  F42.2     Past Psychiatric History: Please see initial evaluation for full details. I have reviewed the history. No updates at this time.     Past Medical History:  Past Medical History:  Diagnosis Date  . Anxiety    Chronic  . OCD (obsessive compulsive disorder)    No past surgical history on file.  Family Psychiatric History: Please see initial evaluation for full details. I have reviewed the history. No updates at this time.     Family History:  Family History  Problem Relation Age of Onset  . Depression Mother   .  Depression Sister   . Bipolar disorder Paternal Uncle   . Schizophrenia Paternal Uncle   . Alcohol abuse Maternal Uncle     Social History:  Social History   Socioeconomic History  . Marital status: Significant Other    Spouse name: Not on file  . Number of children: Not on file  . Years of education: Not on file  . Highest education level: Not on file  Occupational History  . Not on file  Social Needs  . Financial resource strain: Not on file  . Food insecurity    Worry: Not on file    Inability: Not on file  . Transportation needs    Medical: Not on file    Non-medical: Not on file  Tobacco Use  . Smoking status: Current Every Day Smoker     Packs/day: 0.50    Types: Cigarettes  . Smokeless tobacco: Current User    Types: Snuff  Substance and Sexual Activity  . Alcohol use: Yes    Alcohol/week: 7.0 standard drinks    Types: 7 Cans of beer per week    Comment: daily  . Drug use: No  . Sexual activity: Yes  Lifestyle  . Physical activity    Days per week: Not on file    Minutes per session: Not on file  . Stress: Not on file  Relationships  . Social Herbalist on phone: Not on file    Gets together: Not on file    Attends religious service: Not on file    Active member of club or organization: Not on file    Attends meetings of clubs or organizations: Not on file    Relationship status: Not on file  Other Topics Concern  . Not on file  Social History Narrative  . Not on file    Allergies:  Allergies  Allergen Reactions  . Paroxetine Hcl     Erectile dysfunction    Metabolic Disorder Labs: Lab Results  Component Value Date   HGBA1C 5.2 12/03/2018   MPG 103 12/03/2018   No results found for: PROLACTIN Lab Results  Component Value Date   CHOL 137 03/26/2016   TRIG 111 03/26/2016   HDL 61 03/26/2016   CHOLHDL 2.2 03/26/2016   VLDL 22 03/26/2016   LDLCALC 54 03/26/2016   Lab Results  Component Value Date   TSH 1.02 12/03/2018   TSH 1.39 03/26/2016    Therapeutic Level Labs: No results found for: LITHIUM No results found for: VALPROATE No components found for:  CBMZ  Current Medications: Current Outpatient Medications  Medication Sig Dispense Refill  . ALPRAZolam (XANAX) 0.5 MG tablet TAKE 1 TABLET(0.5 MG) BY MOUTH TWICE DAILY AS NEEDED FOR ANXIETY 45 tablet 1  . FLUoxetine (PROZAC) 20 MG tablet Take 1 tablet (20 mg total) by mouth daily. (Patient not taking: Reported on 04/01/2019) 30 tablet 3  . FLUoxetine (PROZAC) 40 MG capsule Take 1 capsule (40 mg total) by mouth daily. 90 capsule 0   No current facility-administered medications for this visit.       Musculoskeletal: Strength & Muscle Tone: N/A Gait & Station: N/A Patient leans: N/A  Psychiatric Specialty Exam: Review of Systems  Psychiatric/Behavioral: Negative for depression, hallucinations, memory loss, substance abuse and suicidal ideas. The patient is nervous/anxious. The patient does not have insomnia.   All other systems reviewed and are negative.   There were no vitals taken for this visit.There is no height or weight on file  to calculate BMI.  General Appearance: Fairly Groomed  Eye Contact:  Good  Speech:  Clear and Coherent  Volume:  Normal  Mood:  Anxious  Affect:  Appropriate, Congruent and reactive  Thought Process:  Coherent  Orientation:  Full (Time, Place, and Person)  Thought Content: Logical   Suicidal Thoughts:  No  Homicidal Thoughts:  No  Memory:  Immediate;   Good  Judgement:  Good  Insight:  Fair  Psychomotor Activity:  Normal  Concentration:  Concentration: Good and Attention Span: Good  Recall:  Good  Fund of Knowledge: Good  Language: Good  Akathisia:  No  Handed:  Right  AIMS (if indicated): not done  Assets:  Communication Skills Desire for Improvement  ADL's:  Intact  Cognition: WNL  Sleep:  Good   Screenings: PHQ2-9     Office Visit from 06/11/2018 in [place]  PHQ-2 Total Score  0       Assessment and Plan:  BALKE METTERT is a 43 y.o. year old male with a history of OCD, anxiety, who presents for follow up appointment for Mixed obsessional thoughts and acts  # OCD with fair insight Although he reports slight improvement in anxiety, he continues to have OCD symptoms with obsessional thoughts and acts.  Psychosocial stressors includes financial strain, unemployment and relationship issues with his girlfriend.  Will uptitrate fluoxetine to target OCD.  We will continue to monitor fatigue he mentioned.  He will greatly benefit from CBT ; will make referral again.   # r/o Tic Although he reports  history of motor tick (neck) for many years, he has not demonstrated any since the initial visit. No known phonic tic. Will continue to monitor.   Plan 1. Increase fluoxetine 40 mg daily  2. Next appointment: 11/11 at 11:30 for 30 mins, video 3. Referral to therapy again  The patient demonstrates the following risk factors for suicide: Chronic risk factors for suicide include:psychiatric disorder ofanxiety. Acute risk factorsfor suicide include: N/A. Protective factorsfor this patient include: positive social support, responsibility to others (children, family), coping skills and hope for the future. Considering these factors, the overall suicide risk at this point appears to below. Patientisappropriate for outpatient follow up.  The duration of this appointment visit was 25 minutes of non face-to-face time with the patient.  Greater than 50% of this time was spent in counseling, explanation of  diagnosis, planning of further management, and coordination of care.  Christopher Clay, MD 06/09/2019, 12:08 PM

## 2019-06-09 ENCOUNTER — Encounter (HOSPITAL_COMMUNITY): Payer: Self-pay | Admitting: Psychiatry

## 2019-06-09 ENCOUNTER — Other Ambulatory Visit: Payer: Self-pay

## 2019-06-09 ENCOUNTER — Ambulatory Visit (INDEPENDENT_AMBULATORY_CARE_PROVIDER_SITE_OTHER): Payer: Medicaid Other | Admitting: Psychiatry

## 2019-06-09 DIAGNOSIS — F422 Mixed obsessional thoughts and acts: Secondary | ICD-10-CM

## 2019-06-09 MED ORDER — FLUOXETINE HCL 40 MG PO CAPS: 40.0000 mg | ORAL_CAPSULE | Freq: Every day | ORAL | 0 refills | Status: DC

## 2019-06-09 NOTE — Patient Instructions (Signed)
1. Increase fluoxetine 40 mg daily  2. Next appointment: 11/11 at 11:30  3. Referral to therapy

## 2019-06-21 ENCOUNTER — Telehealth (HOSPITAL_COMMUNITY): Payer: Self-pay | Admitting: *Deleted

## 2019-06-21 NOTE — Telephone Encounter (Signed)
Patient LVM stating that the increase for the Prozac is causing him to be sleepy all day long & mentioned before the dose increase he was staying sleepy all day long on the Prozac. He stated he would like to try something different that doesn't cause him to be so sleepy all day.

## 2019-06-21 NOTE — Telephone Encounter (Signed)
LVM per provider:  Does he take Prozac at night? If not, advise him to take it at night to avoid risk of drowsiness. If he is interested in medication change- although venlafaxine is less sedative, he may have tried it before. If he is not interested in this option, we can try sertraline. Although this medication can cause drowsiness to some people, there are not so many options for him given his reported trials of medication in the past. Ask him which medication he would like to try.

## 2019-06-21 NOTE — Telephone Encounter (Signed)
Does he take Prozac at night? If not, advise him to take it at night to avoid risk of drowsiness. If he is interested in medication change- although venlafaxine is less sedative, he may have tried it before. If he is not interested in this option, we can try sertraline. Although this medication can cause drowsiness to some people, there are not so many options for him given his reported trials of medication in the past. Ask him which medication he would like to try.

## 2019-07-07 ENCOUNTER — Telehealth (HOSPITAL_COMMUNITY): Payer: Self-pay | Admitting: *Deleted

## 2019-07-07 NOTE — Telephone Encounter (Signed)
Patient has returned my call after many weeks & he states" that he started taking the Prozac @ night .  And that it helped some but his severe anxiety depression continues. Offered suggestions made by provider that he can try: venlafaxine which is less sedative. If he is not interested in this option, we can try sertraline. He asked either one of those recommended for his severe anxiety & depression? If so he would take either one & asked if he could start @ the same dose as the prozac a 40 mg or higher?

## 2019-07-08 ENCOUNTER — Other Ambulatory Visit (HOSPITAL_COMMUNITY): Payer: Self-pay | Admitting: Psychiatry

## 2019-07-08 MED ORDER — VENLAFAXINE HCL ER 37.5 MG PO CP24: 37.5000 mg | ORAL_CAPSULE | Freq: Every day | ORAL | 0 refills | Status: DC

## 2019-07-08 MED ORDER — VENLAFAXINE HCL ER 75 MG PO CP24: ORAL_CAPSULE | ORAL | 0 refills | Status: DC

## 2019-07-08 NOTE — Telephone Encounter (Signed)
Please advise the following. Two orders for venlafaxine were sent to the pharmacy for depression and anxiety (37.5 mg cap and 75 mg cap). Will plan to adjust the dose as needed at his next visit. Please advise him to contact the office if if experiences any side effect (headache can be a little common than other symptoms) Week 1: Discontinue fluoxetine, start venlafaxine 37.5 mg daily  Week 2: Increase venlafaxine 75 mg daily
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
## 2019-07-14 NOTE — Progress Notes (Signed)
Virtual Visit via Video Note  I connected with Christopher Wyatt on 07/21/19 at 11:30 AM EST by a video enabled telemedicine application and verified that I am speaking with the correct person using two identifiers.   I discussed the limitations of evaluation and management by telemedicine and the availability of in person appointments. The patient expressed understanding and agreed to proceed.     I discussed the assessment and treatment plan with the patient. The patient was provided an opportunity to ask questions and all were answered. The patient agreed with the plan and demonstrated an understanding of the instructions.   The patient was advised to call back or seek an in-person evaluation if the symptoms worsen or if the condition fails to improve as anticipated.  I provided 25 minutes of non-face-to-face time during this encounter.   Norman Clay, MD    [facility] MD/PA/NP OP Progress Note  07/21/2019 12:12 PM Christopher Wyatt  MRN:  [id number]  Chief Complaint:  Chief Complaint    Anxiety; Follow-up     HPI:  - Since the last visit, the following was recommended due to patient preference to change his medication.  Week 1: Discontinue fluoxetine, start venlafaxine 37.5 mg daily  Week 2: Increase venlafaxine 75 mg daily   This is a follow-up appointment for OCD. He has taken venlafaxine for two weeks. He feels fatigue after taking this medication, and takes it at night.  He states that he continues to have "tic." He double checks the door at least 3-4 times. He has "weird thoughts" about food. He talks about an episode of him ordering pizza and hamburger at [employer]. Although he had a bite, he felt that hamberger resembles a rabbit. He could not eat it anymore, and thought pizza resembles tore up dog or rabbit. Although he thinks it is "stupid," he cannot help thinking that way. He also could not eat pumpkin pie when he was concerned that somebody in the house may have opened  the box, although he knows that he is the only one who eats pumpkin pie. He pops his right side of the neck 40 times a day. It makes him uncomfortable if he does not do this. He "easily develop a habit" for anything. He denies any violent images. He visits his parents house every day and meets with his son from previous marriage. He reports good relationship with is girlfriend. He denies insomnia. He denies feeling depressed. He enjoys watching NASCAR, baseball game. He has good concentration. He denies SI. He denies anxiety or panic attacks. He has decreased appetite; he denies any weight change. He denies irritability.    171 lbs Wt Readings from Last 3 Encounters:  04/14/19 169 lb (76.7 kg)  02/05/19 165 lb 12.8 oz (75.2 kg)  12/03/18 175 lb 6.4 oz (79.6 kg)    Visit Diagnosis:    ICD-10-CM   1. Mixed obsessional thoughts and acts  F42.2     Past Psychiatric History: Please see initial evaluation for full details. I have reviewed the history. No updates at this time.     Past Medical History:  Past Medical History:  Diagnosis Date  . Anxiety    Chronic  . OCD (obsessive compulsive disorder)    No past surgical history on file.  Family Psychiatric History: Please see initial evaluation for full details. I have reviewed the history. No updates at this time.     Family History:  Family History  Problem Relation Age of Onset  .  Depression Mother   . Depression Sister   . Bipolar disorder Paternal Uncle   . Schizophrenia Paternal Uncle   . Alcohol abuse Maternal Uncle     Social History:  Social History   Socioeconomic History  . Marital status: Significant Other    Spouse name: Not on file  . Number of children: Not on file  . Years of education: Not on file  . Highest education level: Not on file  Occupational History  . Not on file  Social Needs  . Financial resource strain: Not on file  . Food insecurity    Worry: Not on file    Inability: Not on file  .  Transportation needs    Medical: Not on file    Non-medical: Not on file  Tobacco Use  . Smoking status: Current Every Day Smoker    Packs/day: 0.50    Types: Cigarettes  . Smokeless tobacco: Current User    Types: Snuff  Substance and Sexual Activity  . Alcohol use: Yes    Alcohol/week: 7.0 standard drinks    Types: 7 Cans of beer per week    Comment: daily  . Drug use: No  . Sexual activity: Yes  Lifestyle  . Physical activity    Days per week: Not on file    Minutes per session: Not on file  . Stress: Not on file  Relationships  . Social Herbalist on phone: Not on file    Gets together: Not on file    Attends religious service: Not on file    Active member of club or organization: Not on file    Attends meetings of clubs or organizations: Not on file    Relationship status: Not on file  Other Topics Concern  . Not on file  Social History Narrative  . Not on file    Allergies:  Allergies  Allergen Reactions  . Paroxetine Hcl     Erectile dysfunction    Metabolic Disorder Labs: Lab Results  Component Value Date   HGBA1C 5.2 12/03/2018   MPG 103 12/03/2018   No results found for: PROLACTIN Lab Results  Component Value Date   CHOL 137 03/26/2016   TRIG 111 03/26/2016   HDL 61 03/26/2016   CHOLHDL 2.2 03/26/2016   VLDL 22 03/26/2016   LDLCALC 54 03/26/2016   Lab Results  Component Value Date   TSH 1.02 12/03/2018   TSH 1.39 03/26/2016    Therapeutic Level Labs: No results found for: LITHIUM No results found for: VALPROATE No components found for:  CBMZ  Current Medications: Current Outpatient Medications  Medication Sig Dispense Refill  . ALPRAZolam (XANAX) 0.5 MG tablet TAKE 1 TABLET(0.5 MG) BY MOUTH TWICE DAILY AS NEEDED FOR ANXIETY 45 tablet 1  . FLUoxetine (PROZAC) 40 MG capsule Take 1 capsule (40 mg total) by mouth daily. 90 capsule 0  . venlafaxine XR (EFFEXOR-XR) 150 MG 24 hr capsule Take 1 capsule (150 mg total) by mouth every  evening. 90 capsule 0  . venlafaxine XR (EFFEXOR-XR) 37.5 MG 24 hr capsule Take 1 capsule (37.5 mg total) by mouth daily with breakfast. 7 capsule 0  . venlafaxine XR (EFFEXOR-XR) 75 MG 24 hr capsule 75 mg daily. Start after completing 37.5 mg daily for one week 30 capsule 0   No current facility-administered medications for this visit.      Musculoskeletal: Strength & Muscle Tone: N/A Gait & Station: N/A Patient leans: N/A  Psychiatric Specialty Exam:  Review of Systems  Psychiatric/Behavioral: Negative for depression, hallucinations, memory loss, substance abuse and suicidal ideas. The patient is not nervous/anxious and does not have insomnia.   All other systems reviewed and are negative.   There were no vitals taken for this visit.There is no height or weight on file to calculate BMI.  General Appearance: Fairly Groomed  Eye Contact:  Good  Speech:  Clear and Coherent  Volume:  Normal  Mood:  "fine"  Affect:  Appropriate, Congruent and euthymic, reactive  Thought Process:  Coherent  Orientation:  Full (Time, Place, and Person)  Thought Content: Logical   Suicidal Thoughts:  No  Homicidal Thoughts:  No  Memory:  Immediate;   Good  Judgement:  Good  Insight:  Fair  Psychomotor Activity:  Normal  Concentration:  Concentration: Good and Attention Span: Good  Recall:  Good  Fund of Knowledge: Good  Language: Good  Akathisia:  No  Handed:  Right  AIMS (if indicated): not done  Assets:  Communication Skills Desire for Improvement  ADL's:  Intact  Cognition: WNL  Sleep:  Good   Screenings: PHQ2-9     Office Visit from 06/11/2018 in [place]  PHQ-2 Total Score  0       Assessment and Plan:  Christopher Wyatt is a 43 y.o. year old male with a history of OCD, anxiety, who presents for follow up appointment for Mixed obsessional thoughts and acts  # OCD with fair insight He continues to have OCD symptoms with obsessional thoughts and acts.   Psychosocial stressors include financial strain, unemployment and relationship issues with his girlfriend.  We will up titrate venlafaxine to target OCD.  He will greatly benefit from CBT; he is advised to have follow-up.   # r/o tic Although he reports history of mother tic (in neck) for many years, he has not demonstrated any since the initial visit.  No known phonic tic.  We will continue to monitor.   Plan 1. Increase venlafaxine 150 mg daily  2. Next appointment in two months  3. Front desk to contact for therapy follow up   Past trials of medication: Paxil, citalopram, lexapro, fluoxetine, venlafaxine, bupropion, Xanax (memory loss), clonazepam (drowsy)  The patient demonstrates the following risk factors for suicide: Chronic risk factors for suicide include:psychiatric disorder ofanxiety. Acute risk factorsfor suicide include: N/A. Protective factorsfor this patient include: positive social support, responsibility to others (children, family), coping skills and hope for the future. Considering these factors, the overall suicide risk at this point appears to below. Patientisappropriate for outpatient follow up.  The duration of this appointment visit was 25 minutes of non face-to-face time with the patient.  Greater than 50% of this time was spent in counseling, explanation of  diagnosis, planning of further management, and coordination of care.  Norman Clay, MD 07/21/2019, 12:12 PM

## 2019-07-19 ENCOUNTER — Ambulatory Visit (INDEPENDENT_AMBULATORY_CARE_PROVIDER_SITE_OTHER): Payer: Self-pay | Admitting: Otolaryngology

## 2019-07-21 ENCOUNTER — Encounter (HOSPITAL_COMMUNITY): Payer: Self-pay | Admitting: Psychiatry

## 2019-07-21 ENCOUNTER — Other Ambulatory Visit: Payer: Self-pay

## 2019-07-21 ENCOUNTER — Ambulatory Visit (INDEPENDENT_AMBULATORY_CARE_PROVIDER_SITE_OTHER): Payer: Medicaid Other | Admitting: Psychiatry

## 2019-07-21 DIAGNOSIS — F422 Mixed obsessional thoughts and acts: Secondary | ICD-10-CM

## 2019-07-21 MED ORDER — VENLAFAXINE HCL ER 150 MG PO CP24: 150.0000 mg | ORAL_CAPSULE | Freq: Every evening | ORAL | 0 refills | Status: DC

## 2019-07-21 NOTE — Patient Instructions (Signed)
1. Increase venlafaxine 150 mg daily  2. Next appointment in two months  3. Have follow up with your therapist

## 2019-08-19 ENCOUNTER — Other Ambulatory Visit: Payer: Self-pay

## 2019-08-19 DIAGNOSIS — Z20822 Contact with and (suspected) exposure to covid-19: Secondary | ICD-10-CM

## 2019-08-20 ENCOUNTER — Telehealth: Payer: Self-pay | Admitting: Family Medicine

## 2019-08-20 LAB — NOVEL CORONAVIRUS, NAA: SARS-CoV-2, NAA: NOT DETECTED

## 2019-08-20 NOTE — Telephone Encounter (Signed)
Patient is calling for his negative COVID test result. Patient expressed understanding.

## 2019-08-30 ENCOUNTER — Ambulatory Visit: Payer: Medicaid Other | Attending: Internal Medicine

## 2019-08-30 ENCOUNTER — Other Ambulatory Visit: Payer: Self-pay

## 2019-08-30 DIAGNOSIS — Z20828 Contact with and (suspected) exposure to other viral communicable diseases: Secondary | ICD-10-CM | POA: Diagnosis not present

## 2019-08-30 DIAGNOSIS — Z20822 Contact with and (suspected) exposure to covid-19: Secondary | ICD-10-CM

## 2019-08-31 LAB — NOVEL CORONAVIRUS, NAA: SARS-CoV-2, NAA: NOT DETECTED

## 2019-09-06 ENCOUNTER — Other Ambulatory Visit: Payer: Medicaid Other

## 2019-09-06 ENCOUNTER — Other Ambulatory Visit: Payer: Self-pay

## 2019-09-06 ENCOUNTER — Ambulatory Visit: Payer: Medicaid Other | Attending: Internal Medicine

## 2019-09-06 DIAGNOSIS — Z20828 Contact with and (suspected) exposure to other viral communicable diseases: Secondary | ICD-10-CM | POA: Diagnosis not present

## 2019-09-06 DIAGNOSIS — Z20822 Contact with and (suspected) exposure to covid-19: Secondary | ICD-10-CM

## 2019-09-07 LAB — NOVEL CORONAVIRUS, NAA: SARS-CoV-2, NAA: NOT DETECTED

## 2019-09-08 ENCOUNTER — Telehealth: Payer: Self-pay

## 2019-09-08 NOTE — Telephone Encounter (Signed)
Caller given negative result and verbalized understanding  

## 2019-09-13 ENCOUNTER — Ambulatory Visit (HOSPITAL_COMMUNITY): Payer: Medicaid Other | Admitting: Psychiatry

## 2019-09-15 DIAGNOSIS — M9902 Segmental and somatic dysfunction of thoracic region: Secondary | ICD-10-CM | POA: Diagnosis not present

## 2019-09-15 DIAGNOSIS — M546 Pain in thoracic spine: Secondary | ICD-10-CM | POA: Diagnosis not present

## 2019-09-15 DIAGNOSIS — M9901 Segmental and somatic dysfunction of cervical region: Secondary | ICD-10-CM | POA: Diagnosis not present

## 2019-09-15 DIAGNOSIS — M542 Cervicalgia: Secondary | ICD-10-CM | POA: Diagnosis not present

## 2019-09-16 ENCOUNTER — Other Ambulatory Visit: Payer: Self-pay

## 2019-09-16 ENCOUNTER — Ambulatory Visit: Payer: Medicaid Other | Attending: Internal Medicine

## 2019-09-16 DIAGNOSIS — Z20822 Contact with and (suspected) exposure to covid-19: Secondary | ICD-10-CM | POA: Diagnosis not present

## 2019-09-18 LAB — NOVEL CORONAVIRUS, NAA: SARS-CoV-2, NAA: NOT DETECTED

## 2019-09-27 ENCOUNTER — Other Ambulatory Visit: Payer: Self-pay

## 2019-09-27 ENCOUNTER — Encounter: Payer: Self-pay | Admitting: Psychiatry

## 2019-09-27 ENCOUNTER — Ambulatory Visit (INDEPENDENT_AMBULATORY_CARE_PROVIDER_SITE_OTHER): Payer: Medicaid Other | Admitting: Psychiatry

## 2019-09-27 DIAGNOSIS — F419 Anxiety disorder, unspecified: Secondary | ICD-10-CM

## 2019-09-27 DIAGNOSIS — F422 Mixed obsessional thoughts and acts: Secondary | ICD-10-CM

## 2019-09-27 MED ORDER — CLOMIPRAMINE HCL 50 MG PO CAPS: 50.0000 mg | ORAL_CAPSULE | Freq: Every day | ORAL | 1 refills | Status: DC

## 2019-09-27 NOTE — Progress Notes (Signed)
[name] OP Progress Note  Virtual Visit via Telephone Note  I connected with Christopher Wyatt. on 09/27/19 at 11:30 AM EST by telephone and verified that I am speaking with the correct person using two identifiers.   I discussed the limitations, risks, security and privacy concerns of performing an evaluation and management service by telephone and the availability of in person appointments. I also discussed with the patient that there may be a patient responsible charge related to this service. The patient expressed understanding and agreed to proceed.  I attempted to connect with the patient via video call however he did not answer the link that was texted to his phone. So, session was conducted by phone call.    09/27/2019 11:53 AM Christopher Wyatt.  MRN:  [id number]  Chief Complaint:  " That medicine did not help."  HPI: Pt reported that he took venlafaxine regularly for about 4 to 5 weeks however he did not feel any difference after that so he has not been taking it regularly since then.  He stated he is taking maybe 4-5 times in the past few weeks. Patient reported that he continues to have obsessive thoughts of checking the doorknobs, switches etc.. He continues to tap his foot when he is walking around.  He continues to count numbers.  He stated that he has been told by his primary care in the past that he has Tourette's syndrome.  He also reported feeling anxious at times.  He stated that if he sees a dead animal on the road and if he is eating he keeps thinking of the animal and would throw his food away.  He also reported that he is frequently which he believes is part of Tourette's syndrome.  He is scheduled to go on the IV and is also very scared to sit on the passenger seat.  He informed that he has sole custody of his 2 year old child and later this year he supposed to start drivers education.  Patient is not sure how this can help his son with his driving. He stated that he  constantly worries about all these obsessive thoughts and is concerned about the future.  As per EMR, he has been tried on-Paxil, citalopram, lexapro, fluoxetine, venlafaxine, bupropion, Xanax (memory loss), clonazepam (drowsy).  Visit Diagnosis:    ICD-10-CM   1. Mixed obsessional thoughts and acts  F42.2   2. Anxiety  F41.9     Past Psychiatric History: OCD, anxiety, suspected Tourette's syndrome  Past Medical History:  Past Medical History:  Diagnosis Date  . Anxiety    Chronic  . OCD (obsessive compulsive disorder)    No past surgical history on file.  Family Psychiatric History: see below  Family History:  Family History  Problem Relation Age of Onset  . Depression Mother   . Depression Sister   . Bipolar disorder Paternal Uncle   . Schizophrenia Paternal Uncle   . Alcohol abuse Maternal Uncle     Social History:  Social History   Socioeconomic History  . Marital status: Significant Other    Spouse name: Not on file  . Number of children: Not on file  . Years of education: Not on file  . Highest education level: Not on file  Occupational History  . Not on file  Tobacco Use  . Smoking status: Current Every Day Smoker    Packs/day: 0.50    Types: Cigarettes  . Smokeless tobacco: Current User    Types:  Snuff  Substance and Sexual Activity  . Alcohol use: Yes    Alcohol/week: 7.0 standard drinks    Types: 7 Cans of beer per week    Comment: daily  . Drug use: No  . Sexual activity: Yes  Other Topics Concern  . Not on file  Social History Narrative  . Not on file   Social Determinants of Health   Financial Resource Strain:   . Difficulty of Paying Living Expenses: Not on file  Food Insecurity:   . Worried About Charity fundraiser in the Last Year: Not on file  . Ran Out of Food in the Last Year: Not on file  Transportation Needs:   . Lack of Transportation (Medical): Not on file  . Lack of Transportation (Non-Medical): Not on file  Physical  Activity:   . Days of Exercise per Week: Not on file  . Minutes of Exercise per Session: Not on file  Stress:   . Feeling of Stress : Not on file  Social Connections:   . Frequency of Communication with Friends and Family: Not on file  . Frequency of Social Gatherings with Friends and Family: Not on file  . Attends Religious Services: Not on file  . Active Member of Clubs or Organizations: Not on file  . Attends Archivist Meetings: Not on file  . Marital Status: Not on file    Allergies:  Allergies  Allergen Reactions  . Paroxetine Hcl     Erectile dysfunction    Metabolic Disorder Labs: Lab Results  Component Value Date   HGBA1C 5.2 12/03/2018   MPG 103 12/03/2018   No results found for: PROLACTIN Lab Results  Component Value Date   CHOL 137 03/26/2016   TRIG 111 03/26/2016   HDL 61 03/26/2016   CHOLHDL 2.2 03/26/2016   VLDL 22 03/26/2016   LDLCALC 54 03/26/2016   Lab Results  Component Value Date   TSH 1.02 12/03/2018   TSH 1.39 03/26/2016    Therapeutic Level Labs: No results found for: LITHIUM No results found for: VALPROATE No components found for:  CBMZ  Current Medications: Current Outpatient Medications  Medication Sig Dispense Refill  . ALPRAZolam (XANAX) 0.5 MG tablet TAKE 1 TABLET(0.5 MG) BY MOUTH TWICE DAILY AS NEEDED FOR ANXIETY 45 tablet 1  . FLUoxetine (PROZAC) 40 MG capsule Take 1 capsule (40 mg total) by mouth daily. 90 capsule 0  . venlafaxine XR (EFFEXOR-XR) 150 MG 24 hr capsule Take 1 capsule (150 mg total) by mouth every evening. 90 capsule 0  . venlafaxine XR (EFFEXOR-XR) 37.5 MG 24 hr capsule Take 1 capsule (37.5 mg total) by mouth daily with breakfast. 7 capsule 0  . venlafaxine XR (EFFEXOR-XR) 75 MG 24 hr capsule 75 mg daily. Start after completing 37.5 mg daily for one week 30 capsule 0   No current facility-administered medications for this visit.     Musculoskeletal: Strength & Muscle Tone: unable to assess due to  telemed visit Gait & Station: unable to assess due to telemed visit Patient leans: unable to assess due to telemed visit  Psychiatric Specialty Exam: Review of Systems  There were no vitals taken for this visit.There is no height or weight on file to calculate BMI.  General Appearance: unable to assess due to phone visit  Eye Contact:  unable to assess due to phone visit  Speech:  Clear and Coherent and Normal Rate  Volume:  Normal  Mood:  Anxious  Affect:  Congruent  Thought Process: Circumstantial,  Descriptions of Associations: Intact  Orientation:  Full (Time, Place, and Person)  Thought Content: Logical and Rumination   Suicidal Thoughts:  No  Homicidal Thoughts:  No  Memory:  Recent;   Good Remote;   Good  Judgement:  Fair  Insight:  Fair  Psychomotor Activity:  Normal  Concentration:  Concentration: Good and Attention Span: Fair  Recall:  Good  Fund of Knowledge: Good  Language: Good  Akathisia:  Negative  Handed:  Right  AIMS (if indicated): not done  Assets:  Communication Skills Desire for Improvement Financial Resources/Insurance Housing  ADL's:  Intact  Cognition: WNL  Sleep:  Good   Screenings: PHQ2-9     Office Visit from 06/11/2018 in [place]  PHQ-2 Total Score  0       Assessment and Plan: Patient reported that he continues to have obsessive thoughts and compulsive acts.  He has been told in the past that he may have Tourette's syndrome however the diagnosis has not been confirmed.  Due to the session we conducted by phone visit today I was unable to assess for any tics.  Based on patient's multiple failed trials of different SSRIs and SNRI venlafaxine he was offered clomipramine for his obsessive thoughts and compulsive acts. Potential side effects of medication and risks vs benefits of treatment vs non-treatment were explained and discussed. All questions were answered. Patient requested for higher starting dose as he believes lower  doses are never effective for him.  1. Mixed obsessional thoughts and acts  - Start clomiPRAMINE (ANAFRANIL) 50 MG capsule; Take 1 capsule (50 mg total) by mouth at bedtime.  Dispense: 30 capsule; Refill: 1 - Pt has not been taking Venlafaxine.  2. Anxiety  F/up with Dr. Modesta Messing in 6 weeks.   [place] Crane, MD 09/27/2019, 11:53 AM

## 2019-10-04 DIAGNOSIS — M542 Cervicalgia: Secondary | ICD-10-CM | POA: Diagnosis not present

## 2019-10-04 DIAGNOSIS — M9901 Segmental and somatic dysfunction of cervical region: Secondary | ICD-10-CM | POA: Diagnosis not present

## 2019-10-04 DIAGNOSIS — M9902 Segmental and somatic dysfunction of thoracic region: Secondary | ICD-10-CM | POA: Diagnosis not present

## 2019-10-04 DIAGNOSIS — M546 Pain in thoracic spine: Secondary | ICD-10-CM | POA: Diagnosis not present

## 2019-10-12 ENCOUNTER — Other Ambulatory Visit: Payer: Self-pay

## 2019-10-12 ENCOUNTER — Ambulatory Visit (INDEPENDENT_AMBULATORY_CARE_PROVIDER_SITE_OTHER): Payer: Medicaid Other | Admitting: Family Medicine

## 2019-10-12 ENCOUNTER — Encounter: Payer: Self-pay | Admitting: Family Medicine

## 2019-10-12 VITALS — BP 130/64 | HR 96 | Temp 97.8°F | Resp 14 | Ht 72.0 in | Wt 187.0 lb

## 2019-10-12 DIAGNOSIS — K219 Gastro-esophageal reflux disease without esophagitis: Secondary | ICD-10-CM | POA: Diagnosis not present

## 2019-10-12 DIAGNOSIS — R1013 Epigastric pain: Secondary | ICD-10-CM

## 2019-10-12 MED ORDER — PANTOPRAZOLE SODIUM 40 MG PO TBEC: 40.0000 mg | DELAYED_RELEASE_TABLET | Freq: Every day | ORAL | 3 refills | Status: DC

## 2019-10-12 NOTE — Assessment & Plan Note (Signed)
I think we are dealing with severe reflux possible gastritis and ulcer.  I did obtain H. pylori testing on him today.  We will go ahead and start Protonix 40 mg once a day.  We discussed some foods to avoid that may trigger the reflux.  If he does not improve with the above measures we will send him to gastroenterology.

## 2019-10-12 NOTE — Patient Instructions (Signed)
Take the protonix for reflux

## 2019-10-12 NOTE — Progress Notes (Signed)
   Subjective:    Patient ID: Christopher Ahr., male    DOB: 05-25-1976, 44 y.o.   MRN: [id number]  Patient presents for Reflux (worsened by wine- tried OTC meds with no relief)   Pt here with GI upset. Past 3 weeks has had worsen heartburn He feels in the middle of chest up to this troat Worse at night when laying down.  He also has epigastric pain which hit him right before the reflux occurs he keeps TUMS by his bed.He has acid taste in his mouth, feels like he needs to vomit   He states that he is taken 38 tums in the past 4 days  He denies any cuse   Bowels are okay  He has also tried omeprazole 40mg , this did not help   He has noticed it is worse after eating spicy foods fried foods and when he drinks wine  Review Of Systems:  GEN- denies fatigue, fever, weight loss,weakness, recent illness HEENT- denies eye drainage, change in vision, nasal discharge, CVS- denies chest pain, palpitations RESP- denies SOB, cough, wheeze ABD- denies N/V, change in stools, abd pain GU- denies dysuria, hematuria, dribbling, incontinence MSK- denies joint pain, muscle aches, injury Neuro- denies headache, dizziness, syncope, seizure activity       Objective:    BP 130/64   Pulse 96   Temp 97.8 F (36.6 C) (Temporal)   Resp 14   Ht 6' (1.829 m)   Wt 187 lb (84.8 kg)   SpO2 98%   BMI 25.36 kg/m  GEN- NAD, alert and oriented x3 appearing HEENT- PERRL, EOMI, non injected sclera, pink conjunctiva, MMM, oropharynx clear Neck- Supple, CVS- RRR, no murmur RESP-CTAB ABD-NABS,soft, mild tenderness to palpation epigastric region no rebound no guarding EXT- No edema Pulses- Radial 2+        Assessment & Plan:      Problem List Items Addressed This Visit      Unprioritized   GERD (gastroesophageal reflux disease)    I think we are dealing with severe reflux possible gastritis and ulcer.  I did obtain H. pylori testing on him today.  We will go ahead and start Protonix 40 mg  once a day.  We discussed some foods to avoid that may trigger the reflux.  If he does not improve with the above measures we will send him to gastroenterology.       Other Visit Diagnoses    Epigastric pain    -  Primary   Relevant Orders   H. pylori breath test      Note: This dictation was prepared with Dragon dictation along with smaller phrase technology. Any transcriptional errors that result from this process are unintentional.

## 2019-10-13 LAB — H. PYLORI BREATH TEST: H. pylori Breath Test: NOT DETECTED

## 2019-10-19 ENCOUNTER — Encounter: Payer: Self-pay | Admitting: Family Medicine

## 2019-10-30 NOTE — Progress Notes (Signed)
Virtual Visit via Video Note  I connected with Christopher Ahr. on 11/03/19 at 11:20 AM EST by a video enabled telemedicine application and verified that I am speaking with the correct person using two identifiers.   I discussed the limitations of evaluation and management by telemedicine and the availability of in person appointments. The patient expressed understanding and agreed to proceed.     I discussed the assessment and treatment plan with the patient. The patient was provided an opportunity to ask questions and all were answered. The patient agreed with the plan and demonstrated an understanding of the instructions.   The patient was advised to call back or seek an in-person evaluation if the symptoms worsen or if the condition fails to improve as anticipated.  I provided 15 minutes of non-face-to-face time during this encounter.   Norman Clay, MD    [facility] MD/PA/NP OP Progress Note  11/03/2019 12:01 PM Christopher A Allocca Jr.  MRN:  [id number]  Chief Complaint:  Chief Complaint    Anxiety; Follow-up     HPI:  - venlafaxine was switched to clomipramine by DR. Kaur at the last visit.  This is a follow-up appointment for OCD.  He states that he did not start clomipramine as he was told by a pharmacist that this medication is for sleep.  He also states that he wants to drink about half of wine at night, and does not want to take medication which needs to be taken at night.  He reports conflict with his son, age 53. He also reports conflict with his girlfriend, referring to the past incidence of infidelity.  He notices that he tends to spit more when they have conflict. He spits many times as he feels that germ is floating. He checks door knobs and lights many times. He has decreased appetite. He feels irritable, anxious and tense. He feels irritable, although he denies any aggression. He denies panic attacks. He denies insomnia. He denies SI. He denies any side effect from  venlafaxine in the past. He discontinued the medication as he did not notice any difference.   183 lbs Wt Readings from Last 3 Encounters:  10/12/19 187 lb (84.8 kg)  04/14/19 169 lb (76.7 kg)  02/05/19 165 lb 12.8 oz (75.2 kg)    Visit Diagnosis:    ICD-10-CM   1. Mixed obsessional thoughts and acts  F42.2     Past Psychiatric History: Please see initial evaluation for full details. I have reviewed the history. No updates at this time.     Past Medical History:  Past Medical History:  Diagnosis Date  . Anxiety    Chronic  . OCD (obsessive compulsive disorder)    No past surgical history on file.  Family Psychiatric History: Please see initial evaluation for full details. I have reviewed the history. No updates at this time.     Family History:  Family History  Problem Relation Age of Onset  . Depression Mother   . Depression Sister   . Bipolar disorder Paternal Uncle   . Schizophrenia Paternal Uncle   . Alcohol abuse Maternal Uncle     Social History:  Social History   Socioeconomic History  . Marital status: Significant Other    Spouse name: Not on file  . Number of children: Not on file  . Years of education: Not on file  . Highest education level: Not on file  Occupational History  . Not on file  Tobacco Use  .  Smoking status: Current Every Day Smoker    Packs/day: 0.50    Types: Cigarettes  . Smokeless tobacco: Current User    Types: Snuff  Substance and Sexual Activity  . Alcohol use: Yes    Alcohol/week: 7.0 standard drinks    Types: 7 Cans of beer per week    Comment: daily  . Drug use: No  . Sexual activity: Yes  Other Topics Concern  . Not on file  Social History Narrative  . Not on file   Social Determinants of Health   Financial Resource Strain:   . Difficulty of Paying Living Expenses: Not on file  Food Insecurity:   . Worried About Charity fundraiser in the Last Year: Not on file  . Ran Out of Food in the Last Year: Not on file   Transportation Needs:   . Lack of Transportation (Medical): Not on file  . Lack of Transportation (Non-Medical): Not on file  Physical Activity:   . Days of Exercise per Week: Not on file  . Minutes of Exercise per Session: Not on file  Stress:   . Feeling of Stress : Not on file  Social Connections:   . Frequency of Communication with Friends and Family: Not on file  . Frequency of Social Gatherings with Friends and Family: Not on file  . Attends Religious Services: Not on file  . Active Member of Clubs or Organizations: Not on file  . Attends Archivist Meetings: Not on file  . Marital Status: Not on file    Allergies:  Allergies  Allergen Reactions  . Paroxetine Hcl     Erectile dysfunction    Metabolic Disorder Labs: Lab Results  Component Value Date   HGBA1C 5.2 12/03/2018   MPG 103 12/03/2018   No results found for: PROLACTIN Lab Results  Component Value Date   CHOL 137 03/26/2016   TRIG 111 03/26/2016   HDL 61 03/26/2016   CHOLHDL 2.2 03/26/2016   VLDL 22 03/26/2016   LDLCALC 54 03/26/2016   Lab Results  Component Value Date   TSH 1.02 12/03/2018   TSH 1.39 03/26/2016    Therapeutic Level Labs: No results found for: LITHIUM No results found for: VALPROATE No components found for:  CBMZ  Current Medications: Current Outpatient Medications  Medication Sig Dispense Refill  . clomiPRAMINE (ANAFRANIL) 50 MG capsule Take 1 capsule (50 mg total) by mouth at bedtime. (Patient not taking: Reported on 10/12/2019) 30 capsule 1  . pantoprazole (PROTONIX) 40 MG tablet Take 1 tablet (40 mg total) by mouth daily. 30 tablet 3  . venlafaxine XR (EFFEXOR-XR) 150 MG 24 hr capsule Take 150 mg daily for one week, then 225 mg daily (take along with 75 mg) 30 capsule 1  . venlafaxine XR (EFFEXOR-XR) 75 MG 24 hr capsule Take 150 mg daily for one week, then 225 mg daily (take along with 75 mg) 30 capsule 1   No current facility-administered medications for this  visit.     Musculoskeletal: Strength & Muscle Tone: N/A Gait & Station: N/A Patient leans: N/A  Psychiatric Specialty Exam: Review of Systems  Psychiatric/Behavioral: Negative for agitation, behavioral problems, confusion, decreased concentration, dysphoric mood, hallucinations, self-injury, sleep disturbance and suicidal ideas. The patient is nervous/anxious. The patient is not hyperactive.   All other systems reviewed and are negative.   There were no vitals taken for this visit.There is no height or weight on file to calculate BMI.  General Appearance: Fairly Groomed  Eye Contact:  Good  Speech:  Clear and Coherent  Volume:  Normal  Mood:  Anxious  Affect:  Appropriate, Congruent and euthymic  Thought Process:  Coherent  Orientation:  Full (Time, Place, and Person)  Thought Content: Logical   Suicidal Thoughts:  No  Homicidal Thoughts:  No  Memory:  Immediate;   Good  Judgement:  Good  Insight:  Fair  Psychomotor Activity:  Normal  Concentration:  Concentration: Good and Attention Span: Good  Recall:  Good  Fund of Knowledge: Good  Language: Good  Akathisia:  No  Handed:  Right  AIMS (if indicated): not done  Assets:  Communication Skills Desire for Improvement  ADL's:  Intact  Cognition: WNL  Sleep:  Good   Screenings: PHQ2-9     Office Visit from 06/11/2018 in [place]  PHQ-2 Total Score  0       Assessment and Plan:  Christopher Duston. is a 44 y.o. year old male with a history of OCD, anxiety, who presents for follow up appointment for OCD.  # OCD with fair insight He has worsening OCD symptoms with obsessional thoughts and acts.  Psychosocial stressors includes financial strain, unemployment and discordance with his girlfriend.  Provided psychoeducation regarding the importance of adherence to medication.  He is amenable to try higher dose of venlafaxine to optimize its effect.  Discussed potential side effect of headache and  hypertension.  Although he will greatly benefit from CBT, he would like to hold this option at this time.   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 No known phonic tic. Will continue to monitor.  Plan 1. Restart venlafaxine 150 mg daily for one week, then 225 mg daily  2. Next appointment : 4/5 at 9 AM for 30 mins, video (He has not started clomipramine)  Past trials of medication:Paxil, citalopram, lexapro, fluoxetine, venlafaxine (self discontinued, no adverse reaction), bupropion, Xanax (memory loss), clonazepam (drowsy)  The patient demonstrates the following risk factors for suicide: Chronic risk factors for suicide include:psychiatric disorder ofanxiety. Acute risk factorsfor suicide include: N/A. Protective factorsfor this patient include: positive social support, responsibility to others (children, family), coping skills and hope for the future. Considering these factors, the overall suicide risk at this point appears to below. Patientisappropriate for outpatient follow up.  Norman Clay, MD 11/03/2019, 12:01 PM

## 2019-11-03 ENCOUNTER — Other Ambulatory Visit: Payer: Self-pay

## 2019-11-03 ENCOUNTER — Ambulatory Visit (INDEPENDENT_AMBULATORY_CARE_PROVIDER_SITE_OTHER): Payer: Medicaid Other | Admitting: Psychiatry

## 2019-11-03 ENCOUNTER — Encounter (HOSPITAL_COMMUNITY): Payer: Self-pay | Admitting: Psychiatry

## 2019-11-03 DIAGNOSIS — F422 Mixed obsessional thoughts and acts: Secondary | ICD-10-CM | POA: Diagnosis not present

## 2019-11-03 MED ORDER — VENLAFAXINE HCL ER 150 MG PO CP24: ORAL_CAPSULE | ORAL | 1 refills | Status: DC

## 2019-11-03 MED ORDER — VENLAFAXINE HCL ER 75 MG PO CP24: ORAL_CAPSULE | ORAL | 1 refills | Status: DC

## 2019-11-15 ENCOUNTER — Other Ambulatory Visit: Payer: Self-pay | Admitting: Family Medicine

## 2019-11-15 NOTE — Telephone Encounter (Signed)
Ok to refill? ° °Medication is no longer on current list.  °

## 2019-11-18 ENCOUNTER — Ambulatory Visit: Payer: Medicaid Other | Attending: Internal Medicine

## 2019-11-18 ENCOUNTER — Other Ambulatory Visit: Payer: Self-pay

## 2019-11-18 DIAGNOSIS — Z20822 Contact with and (suspected) exposure to covid-19: Secondary | ICD-10-CM | POA: Diagnosis not present

## 2019-11-19 LAB — NOVEL CORONAVIRUS, NAA: SARS-CoV-2, NAA: NOT DETECTED

## 2019-11-22 ENCOUNTER — Other Ambulatory Visit: Payer: Self-pay | Admitting: *Deleted

## 2019-11-22 ENCOUNTER — Telehealth: Payer: Self-pay | Admitting: *Deleted

## 2019-11-22 DIAGNOSIS — K219 Gastro-esophageal reflux disease without esophagitis: Secondary | ICD-10-CM

## 2019-11-22 MED ORDER — PANTOPRAZOLE SODIUM 40 MG PO TBEC: 40.0000 mg | DELAYED_RELEASE_TABLET | Freq: Two times a day (BID) | ORAL | 3 refills | Status: DC

## 2019-11-22 NOTE — Telephone Encounter (Signed)
Received call from patient.   Reports that he has been taking Protonix with no relief.   Inquired as to if there is another medication he can try.

## 2019-11-22 NOTE — Telephone Encounter (Signed)
Call placed to patient and patient made aware per VM.  

## 2019-11-22 NOTE — Telephone Encounter (Signed)
Increase protonix to 40mg  BID Recommend referral to GI like we discussed- Dx GERD

## 2019-11-23 ENCOUNTER — Encounter: Payer: Self-pay | Admitting: Internal Medicine

## 2019-11-29 DIAGNOSIS — M9901 Segmental and somatic dysfunction of cervical region: Secondary | ICD-10-CM | POA: Diagnosis not present

## 2019-11-29 DIAGNOSIS — M9902 Segmental and somatic dysfunction of thoracic region: Secondary | ICD-10-CM | POA: Diagnosis not present

## 2019-11-29 DIAGNOSIS — M546 Pain in thoracic spine: Secondary | ICD-10-CM | POA: Diagnosis not present

## 2019-11-29 DIAGNOSIS — M542 Cervicalgia: Secondary | ICD-10-CM | POA: Diagnosis not present

## 2019-12-07 NOTE — Progress Notes (Signed)
Virtual Visit via Video Note  I connected with Christopher Wyatt. on 12/13/19 at  9:00 AM EDT by a video enabled telemedicine application and verified that I am speaking with the correct person using two identifiers.   I discussed the limitations of evaluation and management by telemedicine and the availability of in person appointments. The patient expressed understanding and agreed to proceed.     I discussed the assessment and treatment plan with the patient. The patient was provided an opportunity to ask questions and all were answered. The patient agreed with the plan and demonstrated an understanding of the instructions.   The patient was advised to call back or seek an in-person evaluation if the symptoms worsen or if the condition fails to improve as anticipated.  I provided 20 minutes of non-face-to-face time during this encounter.   Norman Clay, MD     [facility] MD/PA/NP OP Progress Note  12/13/2019 9:35 AM Christopher Wyatt.  MRN:  [id number]  Chief Complaint:  Chief Complaint    Anxiety; Follow-up     HPI:  This is a follow-up appointment for OCD.  He states that he discontinued venlafaxine after trying for 3 weeks due to drowsiness.  He wants to try other medication for his symptoms.  He continues to be concerned of germs.  He talks about an episode of trying to eat food from [employer].  He saw a lettuce, which resembles snake skin he saw several years ago. He could not eat it. He continues to spit. Although he thinks this is "stupid," he cannot stop doing about this. It occurs every day with anything which could trigger his thought.  He had a panic attack last week.  He was thinking of the panic he had before, and he ruminated on the thought that he hopes he would not have one in front of his son and his girlfriend. He denies insomnia.  He denies any weight change.  He has difficulty concentration.  He denies feeling depressed.  He denies SI.   183 lbs,   Visit  Diagnosis:    ICD-10-CM   1. Mixed obsessional thoughts and acts  F42.2     Past Psychiatric History: Please see initial evaluation for full details. I have reviewed the history. No updates at this time.     Past Medical History:  Past Medical History:  Diagnosis Date  . Anxiety    Chronic  . GERD (gastroesophageal reflux disease)   . OCD (obsessive compulsive disorder)     Past Surgical History:  Procedure Laterality Date  . WISDOM TOOTH EXTRACTION      Family Psychiatric History: Please see initial evaluation for full details. I have reviewed the history. No updates at this time.     Family History:  Family History  Problem Relation Age of Onset  . Depression Mother   . Depression Sister   . Bipolar disorder Paternal Uncle   . Schizophrenia Paternal Uncle   . Alcohol abuse Maternal Uncle   . Colon cancer Neg Hx     Social History:  Social History   Socioeconomic History  . Marital status: Significant Other    Spouse name: Not on file  . Number of children: Not on file  . Years of education: Not on file  . Highest education level: Not on file  Occupational History  . Not on file  Tobacco Use  . Smoking status: Current Every Day Smoker    Packs/day: 0.50  Types: Cigarettes  . Smokeless tobacco: Former Systems developer    Types: Snuff  Substance and Sexual Activity  . Alcohol use: Yes    Comment: 20 ounces daily  . Drug use: No  . Sexual activity: Yes  Other Topics Concern  . Not on file  Social History Narrative  . Not on file   Social Determinants of Health   Financial Resource Strain:   . Difficulty of Paying Living Expenses:   Food Insecurity:   . Worried About Charity fundraiser in the Last Year:   . Arboriculturist in the Last Year:   Transportation Needs:   . Film/video editor (Medical):   Marland Kitchen Lack of Transportation (Non-Medical):   Physical Activity:   . Days of Exercise per Week:   . Minutes of Exercise per Session:   Stress:   . Feeling  of Stress :   Social Connections:   . Frequency of Communication with Friends and Family:   . Frequency of Social Gatherings with Friends and Family:   . Attends Religious Services:   . Active Member of Clubs or Organizations:   . Attends Archivist Meetings:   Marland Kitchen Marital Status:     Allergies:  Allergies  Allergen Reactions  . Paroxetine Hcl     Erectile dysfunction    Metabolic Disorder Labs: Lab Results  Component Value Date   HGBA1C 5.2 12/03/2018   MPG 103 12/03/2018   No results found for: PROLACTIN Lab Results  Component Value Date   CHOL 137 03/26/2016   TRIG 111 03/26/2016   HDL 61 03/26/2016   CHOLHDL 2.2 03/26/2016   VLDL 22 03/26/2016   LDLCALC 54 03/26/2016   Lab Results  Component Value Date   TSH 1.02 12/03/2018   TSH 1.39 03/26/2016    Therapeutic Level Labs: No results found for: LITHIUM No results found for: VALPROATE No components found for:  CBMZ  Current Medications: Current Outpatient Medications  Medication Sig Dispense Refill  . pantoprazole (PROTONIX) 40 MG tablet Take 1 tablet (40 mg total) by mouth 2 (two) times daily. (Patient taking differently: Take 40 mg by mouth 2 (two) times daily. Does not take everyday) 60 tablet 3  . sertraline (ZOLOFT) 25 MG tablet Take 1 tablet (25 mg total) by mouth daily. 30 tablet 1   No current facility-administered medications for this visit.     Musculoskeletal: Strength & Muscle Tone: N/A Gait & Station: N/A Patient leans: N/A  Psychiatric Specialty Exam: Review of Systems  Psychiatric/Behavioral: Positive for decreased concentration. Negative for agitation, behavioral problems, confusion, dysphoric mood, hallucinations, self-injury, sleep disturbance and suicidal ideas. The patient is nervous/anxious. The patient is not hyperactive.   All other systems reviewed and are negative.   There were no vitals taken for this visit.There is no height or weight on file to calculate BMI.   General Appearance: Fairly Groomed  Eye Contact:  Good  Speech:  Clear and Coherent  Volume:  Normal  Mood:  Anxious  Affect:  Appropriate, Congruent and euthymic, reactive  Thought Process:  Coherent  Orientation:  Full (Time, Place, and Person)  Thought Content: Logical   Suicidal Thoughts:  No  Homicidal Thoughts:  No  Memory:  Immediate;   Good  Judgement:  Good  Insight:  Fair  Psychomotor Activity:  Normal  Concentration:  Concentration: Good and Attention Span: Good  Recall:  Good  Fund of Knowledge: Good  Language: Good  Akathisia:  No  Handed:  Right  AIMS (if indicated): not done  Assets:  Communication Skills Desire for Improvement  ADL's:  Intact  Cognition: WNL  Sleep:  Fair   Screenings: PHQ2-9     Office Visit from 06/11/2018 in [place]  PHQ-2 Total Score  0       Assessment and Plan:  Christopher Wyatt. is a 44 y.o. year old male with a history of OCD, anxiety, who presents for follow up appointment for Mixed obsessional thoughts and acts  # OCD with fair insight He continues to have OCD symptoms with obsessional thoughts and acts.  Psychosocial stressors includes financial strain, unemployment and discordance with his current friend.  He could not continue venlafaxine due to drowsiness.  He is not amenable to try lower dose.  Will try sertraline to target OCD.  Discussed potential side effect of drowsiness and decreased libido.  Although he will greatly benefit from CBT, he is not interested in this option at this time.  We will continue to discuss as needed.   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 No known phonic tic. Will continue to monitor.  Plan 1. Start sertraline 25 mg daily  2. Next appointment : 5/11 at 11:10 for 30 mins, video  Past trials of medication:Paxil, citalopram, lexapro,fluoxetine,venlafaxine (drowsiness),  clomipramine, bupropion, Xanax (memory loss), clonazepam (drowsy)  The patient demonstrates the following risk factors for suicide: Chronic risk factors for suicide include:psychiatric disorder ofanxiety. Acute risk factorsfor suicide include: N/A. Protective factorsfor this patient include: positive social support, responsibility to others (children, family), coping skills and hope for the future. Considering these factors, the overall suicide risk at this point appears to below. Patientisappropriate for outpatient follow up.  Norman Clay, MD 12/13/2019, 9:35 AM

## 2019-12-09 ENCOUNTER — Encounter: Payer: Self-pay | Admitting: Nurse Practitioner

## 2019-12-09 ENCOUNTER — Encounter: Payer: Self-pay | Admitting: Internal Medicine

## 2019-12-09 ENCOUNTER — Other Ambulatory Visit: Payer: Self-pay

## 2019-12-09 ENCOUNTER — Ambulatory Visit: Payer: Medicaid Other | Admitting: Nurse Practitioner

## 2019-12-09 VITALS — BP 122/75 | HR 83 | Temp 97.1°F | Ht 72.0 in | Wt 185.6 lb

## 2019-12-09 DIAGNOSIS — K219 Gastro-esophageal reflux disease without esophagitis: Secondary | ICD-10-CM | POA: Diagnosis not present

## 2019-12-09 NOTE — Progress Notes (Signed)
Primary Care Physician:  Alycia Rossetti, MD Primary Gastroenterologist:  Dr. Gala Romney  Chief Complaint  Patient presents with  . Gastroesophageal Reflux    esophagus feels like it tightens up, from throat down to lower chest "feels weird" depending on certain foods he eats, energy drinks or wine makes worse    HPI:   Christopher Wyatt. is a 44 y.o. male who presents on referral from primary care for reflux.  Reviewed information associated with referral including office visit dated 10/12/2019 for GERD and epigastric pain.  At that time noted worsening reflux for which she tried and failed OTC meds.  Noted worsening over the previous 3 weeks, worse when laying down.  Keeps Tums by his bed.  Noted esophageal burning, throat burning, bitter taste.  About 22 TUMS were taken in the past 4 days.  Omeprazole 40 mg did not help.  Noted worsening including spicy foods and drinking wine.  Recommended H. pylori testing and Protonix 40 mg daily.  H. pylori breath testing completed that day and was negative.  Recommended Protonix 40 mg daily.  He called our office and indicated no improvement for which he increase his Protonix to 40 mg twice daily and referred to GI.  No history of endoscopy in our system.  Today he states doing ok overall. Protonix seems to help when he remembers to take it. He doesn't remember to take his medications. He doesn't like to take medications. He has cut back on energy drinks. Does drink beer 2-3 in the evening about an hour prior to bed. Wine causes significant symptoms. Denies abdominal pain. GERD only at night, occasional flares in the evening. Other triggers; Spicy food, tomato sauce. Had a severe episode prior to PCP visit (when he took 49 TUMS). Denies N/V, hematochezia, melena, fever, chills, unintentional weight loss. Denies URI or flu-like symptoms. Denies loss of sense of taste or smell. Was tested for COVID-19 2 weeks ago and was negative. Hasn't gotten a COVID-19  vaccine. Is not interested in vaccine information. Denies chest pain, dyspnea, dizziness, lightheadedness, syncope, near syncope. Denies any other upper or lower GI symptoms.  Past Medical History:  Diagnosis Date  . Anxiety    Chronic  . GERD (gastroesophageal reflux disease)   . OCD (obsessive compulsive disorder)     Past Surgical History:  Procedure Laterality Date  . WISDOM TOOTH EXTRACTION      Current Outpatient Medications  Medication Sig Dispense Refill  . pantoprazole (PROTONIX) 40 MG tablet Take 1 tablet (40 mg total) by mouth 2 (two) times daily. (Patient taking differently: Take 40 mg by mouth 2 (two) times daily. Does not take everyday) 60 tablet 3   No current facility-administered medications for this visit.    Allergies as of 12/09/2019 - Review Complete 12/09/2019  Allergen Reaction Noted  . Paroxetine hcl  01/09/2011    Family History  Problem Relation Age of Onset  . Depression Mother   . Depression Sister   . Bipolar disorder Paternal Uncle   . Schizophrenia Paternal Uncle   . Alcohol abuse Maternal Uncle   . Colon cancer Neg Hx     Social History   Socioeconomic History  . Marital status: Significant Other    Spouse name: Not on file  . Number of children: Not on file  . Years of education: Not on file  . Highest education level: Not on file  Occupational History  . Not on file  Tobacco Use  .  Smoking status: Current Every Day Smoker    Packs/day: 0.50    Types: Cigarettes  . Smokeless tobacco: Former Systems developer    Types: Snuff  Substance and Sexual Activity  . Alcohol use: Yes    Comment: 20 ounces daily  . Drug use: No  . Sexual activity: Yes  Other Topics Concern  . Not on file  Social History Narrative  . Not on file   Social Determinants of Health   Financial Resource Strain:   . Difficulty of Paying Living Expenses:   Food Insecurity:   . Worried About Charity fundraiser in the Last Year:   . Arboriculturist in the Last Year:    Transportation Needs:   . Film/video editor (Medical):   Marland Kitchen Lack of Transportation (Non-Medical):   Physical Activity:   . Days of Exercise per Week:   . Minutes of Exercise per Session:   Stress:   . Feeling of Stress :   Social Connections:   . Frequency of Communication with Friends and Family:   . Frequency of Social Gatherings with Friends and Family:   . Attends Religious Services:   . Active Member of Clubs or Organizations:   . Attends Archivist Meetings:   Marland Kitchen Marital Status:   Intimate Partner Violence:   . Fear of Current or Ex-Partner:   . Emotionally Abused:   Marland Kitchen Physically Abused:   . Sexually Abused:     Subjective: Review of Systems  Constitutional: Negative for chills, fever, malaise/fatigue and weight loss.  HENT: Negative for congestion and sore throat.   Respiratory: Negative for cough and shortness of breath.   Cardiovascular: Negative for chest pain and palpitations.  Gastrointestinal: Positive for heartburn. Negative for abdominal pain, blood in stool, diarrhea, melena, nausea and vomiting.  Musculoskeletal: Negative for joint pain and myalgias.  Skin: Negative for rash.  Neurological: Negative for dizziness and weakness.  Endo/Heme/Allergies: Does not bruise/bleed easily.  Psychiatric/Behavioral: Negative for depression. The patient is not nervous/anxious.   All other systems reviewed and are negative.      Objective: BP 122/75   Pulse 83   Temp (!) 97.1 F (36.2 C) (Oral)   Ht 6' (1.829 m)   Wt 185 lb 9.6 oz (84.2 kg)   BMI 25.17 kg/m  Physical Exam Vitals and nursing note reviewed.  Constitutional:      General: He is not in acute distress.    Appearance: Normal appearance. He is normal weight. He is not ill-appearing, toxic-appearing or diaphoretic.  HENT:     Head: Normocephalic and atraumatic.     Nose: No congestion or rhinorrhea.  Eyes:     General: No scleral icterus. Cardiovascular:     Rate and Rhythm: Normal  rate and regular rhythm.     Heart sounds: Normal heart sounds.  Pulmonary:     Effort: Pulmonary effort is normal.     Breath sounds: Normal breath sounds.  Abdominal:     General: Bowel sounds are normal. There is no distension.     Palpations: Abdomen is soft. There is no hepatomegaly, splenomegaly or mass.     Tenderness: There is no abdominal tenderness. There is no guarding or rebound.     Hernia: No hernia is present.  Musculoskeletal:     Cervical back: Neck supple.  Skin:    General: Skin is warm and dry.     Coloration: Skin is not jaundiced.     Findings: No bruising  or rash.  Neurological:     General: No focal deficit present.     Mental Status: He is alert and oriented to person, place, and time. Mental status is at baseline.  Psychiatric:        Mood and Affect: Mood normal.        Behavior: Behavior normal.        Thought Content: Thought content normal.       12/09/2019 10:01 AM   Disclaimer: This note was dictated with voice recognition software. Similar sounding words can inadvertently be transcribed and may not be corrected upon review.

## 2019-12-09 NOTE — Progress Notes (Signed)
CC'ED TO PCP 

## 2019-12-09 NOTE — Patient Instructions (Addendum)
Your health issues we discussed today were:   GERD (reflux/heartburn): 1. Continue taking Protonix.  You can reduce this to once a day as you have requested. 2. If you continue to have symptoms in the nighttime on once a day dosing you will likely need to increase to twice a day. 3. As we discussed, if you do not take your medication you will continue to have symptoms 4. Further information is printed below for foods to avoid to help prevent GERD. 5. You should also try to quit smoking as this increases your risk for GERD symptoms 6. Call us if you have any worsening or severe symptoms  Overall I recommend:  1. Continue your other current medications 2. Return for follow-up in 4 months 3. Call us if you have any questions or concerns   At [facility] we value your feedback. You may receive a survey about your visit today. Please share your experience as we strive to create trusting relationships with our patients to provide genuine, compassionate, quality care.  We appreciate your understanding and patience as we review any laboratory studies, imaging, and other diagnostic tests that are ordered as we care for you. Our office policy is 5 business days for review of these results, and any emergent or urgent results are addressed in a timely manner for your best interest. If you do not hear from our office in 1 week, please contact us.   We also encourage the use of MyChart, which contains your medical information for your review as well. If you are not enrolled in this feature, an access code is on this after visit summary for your convenience. Thank you for allowing [place] to be involved in your care.  It was great to see you today!  I hope you have a great day!!      Gastroesophageal Reflux Disease, Adult Gastroesophageal reflux (GER) happens when acid from the stomach flows up into the tube that connects the mouth and the stomach (esophagus). Normally, food travels down the  esophagus and stays in the stomach to be digested. However, when a person has GER, food and stomach acid sometimes move back up into the esophagus. If this becomes a more serious problem, the person may be diagnosed with a disease called gastroesophageal reflux disease (GERD). GERD occurs when the reflux:  Happens often.  Causes frequent or severe symptoms.  Causes problems such as damage to the esophagus. When stomach acid comes in contact with the esophagus, the acid may cause soreness (inflammation) in the esophagus. Over time, GERD may create small holes (ulcers) in the lining of the esophagus. What are the causes? This condition is caused by a problem with the muscle between the esophagus and the stomach (lower esophageal sphincter, or LES). Normally, the LES muscle closes after food passes through the esophagus to the stomach. When the LES is weakened or abnormal, it does not close properly, and that allows food and stomach acid to go back up into the esophagus. The LES can be weakened by certain dietary substances, medicines, and medical conditions, including:  Tobacco use.  Pregnancy.  Having a hiatal hernia.  Alcohol use.  Certain foods and beverages, such as coffee, chocolate, onions, and peppermint. What increases the risk? You are more likely to develop this condition if you:  Have an increased body weight.  Have a connective tissue disorder.  Use NSAID medicines. What are the signs or symptoms? Symptoms of this condition include:  Heartburn.  Difficult or painful  swallowing.  The feeling of having a lump in the throat.  Abitter taste in the mouth.  Bad breath.  Having a large amount of saliva.  Having an upset or bloated stomach.  Belching.  Chest pain. Different conditions can cause chest pain. Make sure you see your health care provider if you experience chest pain.  Shortness of breath or wheezing.  Ongoing (chronic) cough or a night-time  cough.  Wearing away of tooth enamel.  Weight loss. How is this diagnosed? Your health care provider will take a medical history and perform a physical exam. To determine if you have mild or severe GERD, your health care provider may also monitor how you respond to treatment. You may also have tests, including:  A test to examine your stomach and esophagus with a small camera (endoscopy).  A test thatmeasures the acidity level in your esophagus.  A test thatmeasures how much pressure is on your esophagus.  A barium swallow or modified barium swallow test to show the shape, size, and functioning of your esophagus. How is this treated? The goal of treatment is to help relieve your symptoms and to prevent complications. Treatment for this condition may vary depending on how severe your symptoms are. Your health care provider may recommend:  Changes to your diet.  Medicine.  Surgery. Follow these instructions at home: Eating and drinking   Follow a diet as recommended by your health care provider. This may involve avoiding foods and drinks such as: ? Coffee and tea (with or without caffeine). ? Drinks that containalcohol. ? Energy drinks and sports drinks. ? Carbonated drinks or sodas. ? Chocolate and cocoa. ? Peppermint and mint flavorings. ? Garlic and onions. ? Horseradish. ? Spicy and acidic foods, including peppers, chili powder, curry powder, vinegar, hot sauces, and barbecue sauce. ? Citrus fruit juices and citrus fruits, such as oranges, lemons, and limes. ? Tomato-based foods, such as red sauce, chili, salsa, and pizza with red sauce. ? Fried and fatty foods, such as donuts, french fries, potato chips, and high-fat dressings. ? High-fat meats, such as hot dogs and fatty cuts of red and white meats, such as rib eye steak, sausage, ham, and bacon. ? High-fat dairy items, such as whole milk, butter, and cream cheese.  Eat small, frequent meals instead of large  meals.  Avoid drinking large amounts of liquid with your meals.  Avoid eating meals during the 2-3 hours before bedtime.  Avoid lying down right after you eat.  Do not exercise right after you eat. Lifestyle   Do not use any products that contain nicotine or tobacco, such as cigarettes, e-cigarettes, and chewing tobacco. If you need help quitting, ask your health care provider.  Try to reduce your stress by using methods such as yoga or meditation. If you need help reducing stress, ask your health care provider.  If you are overweight, reduce your weight to an amount that is healthy for you. Ask your health care provider for guidance about a safe weight loss goal. General instructions  Pay attention to any changes in your symptoms.  Take over-the-counter and prescription medicines only as told by your health care provider. Do not take aspirin, ibuprofen, or other NSAIDs unless your health care provider told you to do so.  Wear loose-fitting clothing. Do not wear anything tight around your waist that causes pressure on your abdomen.  Raise (elevate) the head of your bed about 6 inches (15 cm).  Avoid bending over if this makes  your symptoms worse.  Keep all follow-up visits as told by your health care provider. This is important. Contact a health care provider if:  You have: ? New symptoms. ? Unexplained weight loss. ? Difficulty swallowing or it hurts to swallow. ? Wheezing or a persistent cough. ? A hoarse voice.  Your symptoms do not improve with treatment. Get help right away if you:  Have pain in your arms, neck, jaw, teeth, or back.  Feel sweaty, dizzy, or light-headed.  Have chest pain or shortness of breath.  Vomit and your vomit looks like blood or coffee grounds.  Faint.  Have stool that is bloody or black.  Cannot swallow, drink, or eat. Summary  Gastroesophageal reflux happens when acid from the stomach flows up into the esophagus. GERD is a disease  in which the reflux happens often, causes frequent or severe symptoms, or causes problems such as damage to the esophagus.  Treatment for this condition may vary depending on how severe your symptoms are. Your health care provider may recommend diet and lifestyle changes, medicine, or surgery.  Contact a health care provider if you have new or worsening symptoms.  Take over-the-counter and prescription medicines only as told by your health care provider. Do not take aspirin, ibuprofen, or other NSAIDs unless your health care provider told you to do so.  Keep all follow-up visits as told by your health care provider. This is important. This information is not intended to replace advice given to you by your health care provider. Make sure you discuss any questions you have with your health care provider. Document Revised: 03/04/2018 Document Reviewed: 03/04/2018 Elsevier Patient Education  2020 [place] for Gastroesophageal Reflux Disease, Adult When you have gastroesophageal reflux disease (GERD), the foods you eat and your eating habits are very important. Choosing the right foods can help ease the discomfort of GERD. Consider working with a diet and nutrition specialist (dietitian) to help you make healthy food choices. What general guidelines should I follow?  Eating plan  Choose healthy foods low in fat, such as fruits, vegetables, whole grains, low-fat dairy products, and lean meat, fish, and poultry.  Eat frequent, small meals instead of three large meals each day. Eat your meals slowly, in a relaxed setting. Avoid bending over or lying down until 2-3 hours after eating.  Limit high-fat foods such as fatty meats or fried foods.  Limit your intake of oils, butter, and shortening to less than 8 teaspoons each day.  Avoid the following: ? Foods that cause symptoms. These may be different for different people. Keep a food diary to keep track of foods that cause  symptoms. ? Alcohol. ? Drinking large amounts of liquid with meals. ? Eating meals during the 2-3 hours before bed.  Cook foods using methods other than frying. This may include baking, grilling, or broiling. Lifestyle  Maintain a healthy weight. Ask your health care provider what weight is healthy for you. If you need to lose weight, work with your health care provider to do so safely.  Exercise for at least 30 minutes on 5 or more days each week, or as told by your health care provider.  Avoid wearing clothes that fit tightly around your waist and chest.  Do not use any products that contain nicotine or tobacco, such as cigarettes and e-cigarettes. If you need help quitting, ask your health care provider.  Sleep with the head of your bed raised. Use a wedge under  the mattress or blocks under the bed frame to raise the head of the bed. What foods are not recommended? The items listed may not be a complete list. Talk with your dietitian about what dietary choices are best for you. Grains Pastries or quick breads with added fat. [place] toast. Vegetables Deep fried vegetables. [place] fries. Any vegetables prepared with added fat. Any vegetables that cause symptoms. For some people this may include tomatoes and tomato products, chili peppers, onions and garlic, and horseradish. Fruits Any fruits prepared with added fat. Any fruits that cause symptoms. For some people this may include citrus fruits, such as oranges, grapefruit, pineapple, and lemons. Meats and other protein foods High-fat meats, such as fatty beef or pork, hot dogs, ribs, ham, sausage, salami and bacon. Fried meat or protein, including fried fish and fried chicken. Nuts and nut butters. Dairy Whole milk and chocolate milk. Sour cream. Cream. Ice cream. Cream cheese. Milk shakes. Beverages Coffee and tea, with or without caffeine. Carbonated beverages. Sodas. Energy drinks. Fruit juice made with acidic fruits (such as orange  or grapefruit). Tomato juice. Alcoholic drinks. Fats and oils Butter. Margarine. Shortening. Ghee. Sweets and desserts Chocolate and cocoa. Donuts. Seasoning and other foods Pepper. Peppermint and spearmint. Any condiments, herbs, or seasonings that cause symptoms. For some people, this may include curry, hot sauce, or vinegar-based salad dressings. Summary  When you have gastroesophageal reflux disease (GERD), food and lifestyle choices are very important to help ease the discomfort of GERD.  Eat frequent, small meals instead of three large meals each day. Eat your meals slowly, in a relaxed setting. Avoid bending over or lying down until 2-3 hours after eating.  Limit high-fat foods such as fatty meat or fried foods. This information is not intended to replace advice given to you by your health care provider. Make sure you discuss any questions you have with your health care provider. Document Revised: 12/17/2018 Document Reviewed: 08/27/2016 Elsevier Patient Education  [place].

## 2019-12-09 NOTE — Assessment & Plan Note (Signed)
Persistent GERD symptoms.  Currently on Protonix 40 mg twice daily.  He rarely takes his medication stating he often forgets.  He also states he does not like taking medications.  I recommended he take his PPI to get his reflux under control.  He discusses typical trigger symptoms as per HPI.  I will provide information related to GERD and foods to avoid with GERD.  He is asking to reduce his dose to once a day.  I have indicated he can try this but if he has recurrent symptoms he will need to take twice a day.  We discussed strategies to help remember take his medication including placing his medication near an object that he encounters every day at the time of his medication.  Follow-up in 4 months.

## 2019-12-13 ENCOUNTER — Ambulatory Visit (INDEPENDENT_AMBULATORY_CARE_PROVIDER_SITE_OTHER): Payer: Medicaid Other | Admitting: Psychiatry

## 2019-12-13 ENCOUNTER — Encounter (HOSPITAL_COMMUNITY): Payer: Self-pay | Admitting: Psychiatry

## 2019-12-13 ENCOUNTER — Other Ambulatory Visit: Payer: Self-pay

## 2019-12-13 DIAGNOSIS — F422 Mixed obsessional thoughts and acts: Secondary | ICD-10-CM

## 2019-12-13 MED ORDER — SERTRALINE HCL 25 MG PO TABS: 25.0000 mg | ORAL_TABLET | Freq: Every day | ORAL | 1 refills | Status: DC

## 2020-01-12 NOTE — Progress Notes (Signed)
Virtual Visit via Video Note  I connected with Christopher Wyatt. on 01/18/20 at 11:10 AM EDT by a video enabled telemedicine application and verified that I am speaking with the correct person using two identifiers.   I discussed the limitations of evaluation and management by telemedicine and the availability of in person appointments. The patient expressed understanding and agreed to proceed.     I discussed the assessment and treatment plan with the patient. The patient was provided an opportunity to ask questions and all were answered. The patient agreed with the plan and demonstrated an understanding of the instructions.   The patient was advised to call back or seek an in-person evaluation if the symptoms worsen or if the condition fails to improve as anticipated.  I provided 15 minutes of non-face-to-face time during this encounter.   Norman Clay, MD    [facility] MD/PA/NP OP Progress Note  01/18/2020 11:26 AM Christopher Rosine Abe.  MRN:  [id number]  Chief Complaint:  Chief Complaint    Follow-up; Other     HPI:  This is a follow-up appointment for depression.  He states that he is doing "so so." He believes sertraline may be working some, and denies any side effect. He has appetite loss and lost weight.  He talks about an episode of him not being able to eat rib steak, which reminds him of snake he saw in the yard. He states he is germ phobic, and washes his hands at least 12 times a day. He repeats turning on and off switches. He checks doors repeatedly, although it is time consuming for him.  He denies feeling depressed.  He believes he has better concentration.  He feels anxious and tense.  He has a fear of fright or cruise, or being on something he cannot control. He wishes to do more things for his son. He denies insomnia. He denies panic attacks.    183.6 lbs Wt Readings from Last 3 Encounters:  12/09/19 185 lb 9.6 oz (84.2 kg)  10/12/19 187 lb (84.8 kg)  04/14/19 169  lb (76.7 kg)     Visit Diagnosis:    ICD-10-CM   1. Mixed obsessional thoughts and acts  F42.2     Past Psychiatric History: Please see initial evaluation for full details. I have reviewed the history. No updates at this time.     Past Medical History:  Past Medical History:  Diagnosis Date  . Anxiety    Chronic  . GERD (gastroesophageal reflux disease)   . OCD (obsessive compulsive disorder)     Past Surgical History:  Procedure Laterality Date  . WISDOM TOOTH EXTRACTION      Family Psychiatric History: Please see initial evaluation for full details. I have reviewed the history. No updates at this time.     Family History:  Family History  Problem Relation Age of Onset  . Depression Mother   . Depression Sister   . Bipolar disorder Paternal Uncle   . Schizophrenia Paternal Uncle   . Alcohol abuse Maternal Uncle   . Colon cancer Neg Hx     Social History:  Social History   Socioeconomic History  . Marital status: Significant Other    Spouse name: Not on file  . Number of children: Not on file  . Years of education: Not on file  . Highest education level: Not on file  Occupational History  . Not on file  Tobacco Use  . Smoking status: Current Every Day  Smoker    Packs/day: 0.50    Types: Cigarettes  . Smokeless tobacco: Former Systems developer    Types: Snuff  Substance and Sexual Activity  . Alcohol use: Yes    Comment: 20 ounces daily  . Drug use: No  . Sexual activity: Yes  Other Topics Concern  . Not on file  Social History Narrative  . Not on file   Social Determinants of Health   Financial Resource Strain:   . Difficulty of Paying Living Expenses:   Food Insecurity:   . Worried About Charity fundraiser in the Last Year:   . Arboriculturist in the Last Year:   Transportation Needs:   . Film/video editor (Medical):   Marland Kitchen Lack of Transportation (Non-Medical):   Physical Activity:   . Days of Exercise per Week:   . Minutes of Exercise per  Session:   Stress:   . Feeling of Stress :   Social Connections:   . Frequency of Communication with Friends and Family:   . Frequency of Social Gatherings with Friends and Family:   . Attends Religious Services:   . Active Member of Clubs or Organizations:   . Attends Archivist Meetings:   Marland Kitchen Marital Status:     Allergies:  Allergies  Allergen Reactions  . Paroxetine Hcl     Erectile dysfunction    Metabolic Disorder Labs: Lab Results  Component Value Date   HGBA1C 5.2 12/03/2018   MPG 103 12/03/2018   No results found for: PROLACTIN Lab Results  Component Value Date   CHOL 137 03/26/2016   TRIG 111 03/26/2016   HDL 61 03/26/2016   CHOLHDL 2.2 03/26/2016   VLDL 22 03/26/2016   LDLCALC 54 03/26/2016   Lab Results  Component Value Date   TSH 1.02 12/03/2018   TSH 1.39 03/26/2016    Therapeutic Level Labs: No results found for: LITHIUM No results found for: VALPROATE No components found for:  CBMZ  Current Medications: Current Outpatient Medications  Medication Sig Dispense Refill  . pantoprazole (PROTONIX) 40 MG tablet Take 1 tablet (40 mg total) by mouth 2 (two) times daily. (Patient taking differently: Take 40 mg by mouth 2 (two) times daily. Does not take everyday) 60 tablet 3  . sertraline (ZOLOFT) 25 MG tablet Take 1 tablet (25 mg total) by mouth daily. 30 tablet 1   No current facility-administered medications for this visit.     Musculoskeletal: Strength & Muscle Tone: N/A Gait & Station: N/A Patient leans: N/A  Psychiatric Specialty Exam: Review of Systems  Psychiatric/Behavioral: Negative for agitation, behavioral problems, confusion, decreased concentration, dysphoric mood, hallucinations, self-injury, sleep disturbance and suicidal ideas. The patient is nervous/anxious. The patient is not hyperactive.   All other systems reviewed and are negative.   There were no vitals taken for this visit.There is no height or weight on file to  calculate BMI.  General Appearance: Fairly Groomed  Eye Contact:  Good  Speech:  Clear and Coherent  Volume:  Normal  Mood:  cannot complain  Affect:  Appropriate, Congruent and Full Range  Thought Process:  Coherent  Orientation:  Full (Time, Place, and Person)  Thought Content: Logical   Suicidal Thoughts:  No  Homicidal Thoughts:  No  Memory:  Immediate;   Good  Judgement:  Good  Insight:  Fair  Psychomotor Activity:  Normal  Concentration:  Concentration: Good and Attention Span: Good  Recall:  Good  Fund of Knowledge: Good  Language: Good  Akathisia:  No  Handed:  Right  AIMS (if indicated): not done  Assets:  Communication Skills Desire for Improvement  ADL's:  Intact  Cognition: WNL  Sleep:  Good   Screenings: PHQ2-9     Office Visit from 06/11/2018 in [place]  PHQ-2 Total Score  0       Assessment and Plan:  Christopher Sabath. is a 44 y.o. year old male with a history of OCD,  anxiety, who presents for follow up appointment for Mixed obsessional thoughts and acts  # OCD with fair insight There has been overall improvement in obsessional thoughts and acts since starting sertraline.  Psychosocial stressors includes financial strain, unemployment.  Will slowly uptitrate sertraline to optimize its effect for OCD.  He is not interested in therapy at this time.  We will continue to discuss as needed.   Plan 1. Increase sertraline 50 mg daily  2. Next appointment: 6/28 at 10:40 for 30 mins, video  Past trials of medication:Paxil, citalopram, lexapro,fluoxetine,venlafaxine(drowsiness), clomipramine, bupropion, Xanax (memory loss), clonazepam (drowsy)  The patient demonstrates the following risk factors for suicide: Chronic risk factors for suicide include:psychiatric disorder ofanxiety. Acute risk factorsfor suicide include: N/A. Protective factorsfor this patient include: positive social support, responsibility to others (children,  family), coping skills and hope for the future. Considering these factors, the overall suicide risk at this point appears to below. Patientisappropriate for outpatient follow up.  Norman Clay, MD 01/18/2020, 11:26 AM

## 2020-01-17 DIAGNOSIS — H5213 Myopia, bilateral: Secondary | ICD-10-CM | POA: Diagnosis not present

## 2020-01-18 ENCOUNTER — Telehealth (INDEPENDENT_AMBULATORY_CARE_PROVIDER_SITE_OTHER): Payer: Medicaid Other | Admitting: Psychiatry

## 2020-01-18 ENCOUNTER — Other Ambulatory Visit: Payer: Self-pay

## 2020-01-18 ENCOUNTER — Encounter (HOSPITAL_COMMUNITY): Payer: Self-pay | Admitting: Psychiatry

## 2020-01-18 DIAGNOSIS — F422 Mixed obsessional thoughts and acts: Secondary | ICD-10-CM | POA: Diagnosis not present

## 2020-01-18 MED ORDER — SERTRALINE HCL 50 MG PO TABS: 50.0000 mg | ORAL_TABLET | Freq: Every day | ORAL | 1 refills | Status: DC

## 2020-01-18 NOTE — Patient Instructions (Signed)
1. Increase sertraline 50 mg daily  2. Next appointment: 6/28 at 10:40

## 2020-02-04 DIAGNOSIS — M546 Pain in thoracic spine: Secondary | ICD-10-CM | POA: Diagnosis not present

## 2020-02-04 DIAGNOSIS — M9902 Segmental and somatic dysfunction of thoracic region: Secondary | ICD-10-CM | POA: Diagnosis not present

## 2020-02-04 DIAGNOSIS — M9901 Segmental and somatic dysfunction of cervical region: Secondary | ICD-10-CM | POA: Diagnosis not present

## 2020-02-04 DIAGNOSIS — M542 Cervicalgia: Secondary | ICD-10-CM | POA: Diagnosis not present

## 2020-02-18 DIAGNOSIS — M9902 Segmental and somatic dysfunction of thoracic region: Secondary | ICD-10-CM | POA: Diagnosis not present

## 2020-02-18 DIAGNOSIS — M9901 Segmental and somatic dysfunction of cervical region: Secondary | ICD-10-CM | POA: Diagnosis not present

## 2020-02-18 DIAGNOSIS — M542 Cervicalgia: Secondary | ICD-10-CM | POA: Diagnosis not present

## 2020-02-18 DIAGNOSIS — M546 Pain in thoracic spine: Secondary | ICD-10-CM | POA: Diagnosis not present

## 2020-02-21 ENCOUNTER — Telehealth: Payer: Self-pay | Admitting: Family Medicine

## 2020-02-21 ENCOUNTER — Ambulatory Visit: Payer: Medicaid Other | Admitting: Family Medicine

## 2020-02-21 NOTE — Telephone Encounter (Signed)
Forwarded to PCP.

## 2020-02-21 NOTE — Telephone Encounter (Signed)
°  Noted events below Please send discharge from clinic due to inappropriate behavior. Please alert [facility] of this incident

## 2020-02-21 NOTE — Telephone Encounter (Signed)
Patient arrived at clinic greater than 15 minutes late for his appointment. I advised patient that he would need to reschedule. He states no I was in the building at 11:08 and approached the desk at 11:25. When I advised patient that he wasn't in the building at 11:08 he became argumentative saying that he would just change providers for this incident. He requested a release form for records. I proceeded to get him the form and that is when he pulled out phone and started recording and saying "I was going on [employer] and I was about to loose my job." He was recording on his phone the entire time I handed him the release and he started to fill it out; however he stopped and started to leave and said that he was going to contact [facility] and I could kiss my job goodbye. I did respond with no sir that is where you are wrong I am not going to loose my job. His response was after I finish recording you and putting you on facebook live you will. He walked out the office with his release form.

## 2020-02-23 DIAGNOSIS — M546 Pain in thoracic spine: Secondary | ICD-10-CM | POA: Diagnosis not present

## 2020-02-23 DIAGNOSIS — M9901 Segmental and somatic dysfunction of cervical region: Secondary | ICD-10-CM | POA: Diagnosis not present

## 2020-02-23 DIAGNOSIS — M542 Cervicalgia: Secondary | ICD-10-CM | POA: Diagnosis not present

## 2020-02-23 DIAGNOSIS — M9902 Segmental and somatic dysfunction of thoracic region: Secondary | ICD-10-CM | POA: Diagnosis not present

## 2020-02-24 DIAGNOSIS — H6981 Other specified disorders of Eustachian tube, right ear: Secondary | ICD-10-CM | POA: Diagnosis not present

## 2020-02-24 DIAGNOSIS — H903 Sensorineural hearing loss, bilateral: Secondary | ICD-10-CM | POA: Diagnosis not present

## 2020-02-29 NOTE — Progress Notes (Signed)
Virtual Visit via Video Note  I connected with Christopher Wyatt. on 03/06/20 at 10:40 AM EDT by a video enabled telemedicine application and verified that I am speaking with the correct person using two identifiers.   I discussed the limitations of evaluation and management by telemedicine and the availability of in person appointments. The patient expressed understanding and agreed to proceed.     I discussed the assessment and treatment plan with the patient. The patient was provided an opportunity to ask questions and all were answered. The patient agreed with the plan and demonstrated an understanding of the instructions.   The patient was advised to call back or seek an in-person evaluation if the symptoms worsen or if the condition fails to improve as anticipated.  Location: patient- in a car, provider- office   I provided 7 minutes of non-face-to-face time during this encounter.   Norman Clay, MD    [facility] MD/PA/NP OP Progress Note  03/06/2020 11:10 AM Christopher Wyatt.  MRN:  [id number]  Chief Complaint:  Chief Complaint    Follow-up; Anxiety     HPI:  This is a follow-up appointment for OCD.  He checked came 25 minutes late for the appointment.  He states that he has been feeling a little better since the last visit.  However, he continues to have decreased appetite as he has obsessive thoughts that the food resembles certain animals, although he thinks of it as crazy.  He continues to spit after he eats. He had a panic attack when he visited a shop for race car. He continues to check doors and wash hands repeatedly. He reports occasional headache, which he attributes to being stressed with daily routine. 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  He sleeps better. He has fair concentration. He denies SI.  He feels anxious and tense at times.   Feels  Visit Diagnosis:    ICD-10-CM   1. Mixed obsessional thoughts and acts  F42.2     Past Psychiatric  History: Please see initial evaluation for full details. I have reviewed the history. No updates at this time.     Past Medical History:  Past Medical History:  Diagnosis Date  . Anxiety    Chronic  . GERD (gastroesophageal reflux disease)   . OCD (obsessive compulsive disorder)     Past Surgical History:  Procedure Laterality Date  . WISDOM TOOTH EXTRACTION      Family Psychiatric History: Please see initial evaluation for full details. I have reviewed the history. No updates at this time.     Family History:  Family History  Problem Relation Age of Onset  . Depression Mother   . Depression Sister   . Bipolar disorder Paternal Uncle   . Schizophrenia Paternal Uncle   . Alcohol abuse Maternal Uncle   . Colon cancer Neg Hx     Social History:  Social History   Socioeconomic History  . Marital status: Significant Other    Spouse name: Not on file  . Number of children: Not on file  . Years of education: Not on file  . Highest education level: Not on file  Occupational History  . Not on file  Tobacco Use  . Smoking status: Current Every Day Smoker    Packs/day: 0.50    Types: Cigarettes  . Smokeless tobacco: Former Systems developer    Types: Snuff  Vaping Use  . Vaping Use: Never used  Substance and Sexual  Activity  . Alcohol use: Yes    Comment: 20 ounces daily  . Drug use: No  . Sexual activity: Yes  Other Topics Concern  . Not on file  Social History Narrative  . Not on file   Social Determinants of Health   Financial Resource Strain:   . Difficulty of Paying Living Expenses:   Food Insecurity:   . Worried About Charity fundraiser in the Last Year:   . Arboriculturist in the Last Year:   Transportation Needs:   . Film/video editor (Medical):   Marland Kitchen Lack of Transportation (Non-Medical):   Physical Activity:   . Days of Exercise per Week:   . Minutes of Exercise per Session:   Stress:   . Feeling of Stress :   Social Connections:   . Frequency of  Communication with Friends and Family:   . Frequency of Social Gatherings with Friends and Family:   . Attends Religious Services:   . Active Member of Clubs or Organizations:   . Attends Archivist Meetings:   Marland Kitchen Marital Status:     Allergies:  Allergies  Allergen Reactions  . Paroxetine Hcl     Erectile dysfunction    Metabolic Disorder Labs: Lab Results  Component Value Date   HGBA1C 5.2 12/03/2018   MPG 103 12/03/2018   No results found for: PROLACTIN Lab Results  Component Value Date   CHOL 137 03/26/2016   TRIG 111 03/26/2016   HDL 61 03/26/2016   CHOLHDL 2.2 03/26/2016   VLDL 22 03/26/2016   LDLCALC 54 03/26/2016   Lab Results  Component Value Date   TSH 1.02 12/03/2018   TSH 1.39 03/26/2016    Therapeutic Level Labs: No results found for: LITHIUM No results found for: VALPROATE No components found for:  CBMZ  Current Medications: Current Outpatient Medications  Medication Sig Dispense Refill  . pantoprazole (PROTONIX) 40 MG tablet Take 1 tablet (40 mg total) by mouth 2 (two) times daily. (Patient taking differently: Take 40 mg by mouth 2 (two) times daily. Does not take everyday) 60 tablet 3  . sertraline (ZOLOFT) 50 MG tablet Take 1 tablet (50 mg total) by mouth daily. 30 tablet 1   No current facility-administered medications for this visit.     Musculoskeletal: Strength & Muscle Tone: N/A Gait & Station: N/A Patient leans: N/A  Psychiatric Specialty Exam: Review of Systems  Psychiatric/Behavioral: Negative for agitation, behavioral problems and confusion.  All other systems reviewed and are negative.   There were no vitals taken for this visit.There is no height or weight on file to calculate BMI.  General Appearance: Fairly Groomed  Eye Contact:  Good  Speech:  Clear and Coherent  Volume:  Normal  Mood:  better  Affect:  Appropriate, Congruent and euthymic  Thought Process:  Coherent  Orientation:  Full (Time, Place, and  Person)  Thought Content: Logical   Suicidal Thoughts:  No  Homicidal Thoughts:  No  Memory:  Immediate;   Good  Judgement:  Good  Insight:  Fair  Psychomotor Activity:  Normal  Concentration:  Concentration: Good and Attention Span: Good  Recall:  Good  Fund of Knowledge: Good  Language: Good  Akathisia:  No  Handed:  Right  AIMS (if indicated): not done  Assets:  Communication Skills Desire for Improvement  ADL's:  Intact  Cognition: WNL  Sleep:  Fair   Screenings: PHQ2-9     Office Visit from 06/11/2018 in  [facility]  PHQ-2 Total Score 0       Assessment and Plan:  Christopher Guthmiller. is a 44 y.o. year old male with a history of  OCD,  anxiety, who presents for follow up appointment for below.   1. Mixed obsessional thoughts and acts He continues to report obsessional thoughts and acts since the last visit.  We will do further up titration of sertraline to optimize its benefit for OCD.  We will monitor headache, although it is unclear whether it is attributable to sertraline.  He is not interested in therapy at this time.  We will continue to discuss as needed.   Plan 1. Increase sertraline 100 mg daily  2.Next appointment:8/16 at 11:20 for 20 mins, video  Past trials of medication:Paxil, citalopram,lexapro,fluoxetine,venlafaxine(drowsiness),clomipramine,bupropion, Xanax (memory loss), clonazepam (drowsy)  The patient demonstrates the following risk factors for suicide: Chronic risk factors for suicide include:psychiatric disorder ofanxiety. Acute risk factorsfor suicide include: N/A. Protective factorsfor this patient include: positive social support, responsibility to others (children, family), coping skills and hope for the future. Considering these factors, the overall suicide risk at this point appears to below. Patientisappropriate for outpatient follow up.   Norman Clay, MD 03/06/2020, 11:10 AM

## 2020-03-06 ENCOUNTER — Other Ambulatory Visit: Payer: Self-pay

## 2020-03-06 ENCOUNTER — Encounter (HOSPITAL_COMMUNITY): Payer: Self-pay | Admitting: Psychiatry

## 2020-03-06 ENCOUNTER — Telehealth (INDEPENDENT_AMBULATORY_CARE_PROVIDER_SITE_OTHER): Payer: Medicaid Other | Admitting: Psychiatry

## 2020-03-06 DIAGNOSIS — F422 Mixed obsessional thoughts and acts: Secondary | ICD-10-CM

## 2020-03-06 MED ORDER — SERTRALINE HCL 100 MG PO TABS: 100.0000 mg | ORAL_TABLET | Freq: Every day | ORAL | 1 refills | Status: DC

## 2020-03-06 NOTE — Patient Instructions (Signed)
1. Increase sertraline 100 mg daily  2.Next appointment:8/16 at 11:20

## 2020-03-06 NOTE — Addendum Note (Signed)
Addended by: Norman Clay on: 03/06/2020 11:42 AM   Modules accepted: Level of Service

## 2020-04-06 ENCOUNTER — Other Ambulatory Visit: Payer: Self-pay

## 2020-04-06 ENCOUNTER — Ambulatory Visit (INDEPENDENT_AMBULATORY_CARE_PROVIDER_SITE_OTHER): Payer: Medicaid Other | Admitting: Internal Medicine

## 2020-04-06 ENCOUNTER — Encounter: Payer: Self-pay | Admitting: Internal Medicine

## 2020-04-06 VITALS — BP 125/89 | HR 82 | Resp 16 | Ht 72.0 in | Wt 189.1 lb

## 2020-04-06 DIAGNOSIS — F422 Mixed obsessional thoughts and acts: Secondary | ICD-10-CM | POA: Diagnosis not present

## 2020-04-06 DIAGNOSIS — K219 Gastro-esophageal reflux disease without esophagitis: Secondary | ICD-10-CM | POA: Diagnosis not present

## 2020-04-06 DIAGNOSIS — F419 Anxiety disorder, unspecified: Secondary | ICD-10-CM

## 2020-04-06 DIAGNOSIS — Z7689 Persons encountering health services in other specified circumstances: Secondary | ICD-10-CM | POA: Diagnosis not present

## 2020-04-06 DIAGNOSIS — F410 Panic disorder [episodic paroxysmal anxiety] without agoraphobia: Secondary | ICD-10-CM

## 2020-04-06 DIAGNOSIS — Z720 Tobacco use: Secondary | ICD-10-CM

## 2020-04-06 MED ORDER — NICOTINE POLACRILEX 2 MG MT GUM: 4.0000 mg | CHEWING_GUM | OROMUCOSAL | Status: DC

## 2020-04-06 NOTE — Patient Instructions (Signed)
Refused labs today Follow up in 3 months Repeat labs on next visit quit smoking!!!!!

## 2020-04-06 NOTE — Progress Notes (Signed)
New Patient Office Visit  Subjective:  Patient ID: Christopher Ahr., Christopher Wyatt    DOB: 11/04/1975  Age: 44 y.o. MRN: [id number]  CC:  Chief Complaint  Patient presents with  . New Patient (Initial Visit)    establish care    HPI Christopher A Olson Lucarelli. is a very pleasant 44 year old Christopher Wyatt with past medical history of OCD, GERD, anxiety/panic attacks, tobacco abuse presents to our clinic for the first time for establishment of care.  Patient tells me that he is doing fine, denies any complaints. Reports that he is followed by psychiatrist due to his underlying history of OCD/anxiety/panic attacks. He takes Zoloft 100 mg once daily which helps his symptoms. He denies depressed mood, suicidal or homicidal thoughts. He does have decreased appetite, does not exercise and his diet is mostly consistent of junk food.  He continues to smoke 1 to 1-1/2 pack of cigarettes per day, drinks alcohol 3-4 times per week, denies illicit drug use. He has tried nicotine patch, Chantix, gum in the past for smoking cessation with no help. He requested for nicotine gum prescription. He tells me that he is working hard to quit smoking.   He has smoker cough however denies wheezing, shortness of breath, fever, chills, chest congestion, nausea, vomiting, headache, blurry vision, chest pain, palpitation, leg swelling, urinary or bowel changes.  He has decreased appetite-no history of melena, unintentional weight loss, history of HIV, night sweats.  He is sexually active. Denies history of STDs. He has one son. He refused to get COVID-19 vaccine.  Past Medical History:  Diagnosis Date  . Anxiety    Chronic  . GERD (gastroesophageal reflux disease)   . OCD (obsessive compulsive disorder)     Past Surgical History:  Procedure Laterality Date  . WISDOM TOOTH EXTRACTION      Family History  Problem Relation Age of Onset  . Depression Mother   . Depression Sister   . Bipolar disorder Paternal Uncle   .  Schizophrenia Paternal Uncle   . Alcohol abuse Maternal Uncle   . Colon cancer Neg Hx     Social History   Socioeconomic History  . Marital status: Significant Other    Spouse name: Not on file  . Number of children: Not on file  . Years of education: Not on file  . Highest education level: Not on file  Occupational History  . Not on file  Tobacco Use  . Smoking status: Current Every Day Smoker    Packs/day: 0.50    Types: Cigarettes  . Smokeless tobacco: Former Systems developer    Types: Snuff  Vaping Use  . Vaping Use: Never used  Substance and Sexual Activity  . Alcohol use: Yes    Comment: 20 ounces daily  . Drug use: No  . Sexual activity: Yes  Other Topics Concern  . Not on file  Social History Narrative  . Not on file   Social Determinants of Health   Financial Resource Strain:   . Difficulty of Paying Living Expenses:   Food Insecurity:   . Worried About Charity fundraiser in the Last Year:   . Arboriculturist in the Last Year:   Transportation Needs:   . Film/video editor (Medical):   Marland Kitchen Lack of Transportation (Non-Medical):   Physical Activity:   . Days of Exercise per Week:   . Minutes of Exercise per Session:   Stress:   . Feeling of Stress :   Social  Connections:   . Frequency of Communication with Friends and Family:   . Frequency of Social Gatherings with Friends and Family:   . Attends Religious Services:   . Active Member of Clubs or Organizations:   . Attends Archivist Meetings:   Marland Kitchen Marital Status:   Intimate Partner Violence:   . Fear of Current or Ex-Partner:   . Emotionally Abused:   Marland Kitchen Physically Abused:   . Sexually Abused:     ROS Review of Systems  Constitutional: Negative.   HENT: Negative.   Eyes: Negative.   Respiratory: Negative.   Cardiovascular: Negative.   Gastrointestinal: Negative.   Endocrine: Negative.   Genitourinary: Negative.   Musculoskeletal: Negative.   Skin: Negative.   Allergic/Immunologic:  Negative.   Neurological: Negative.   Hematological: Negative.   Psychiatric/Behavioral: The patient is nervous/anxious.     Objective:   Today's Vitals: BP (!) 125/89   Pulse 82   Resp 16   Ht 6' (1.829 m)   Wt 189 lb 1.9 oz (85.8 kg)   SpO2 95%   BMI 25.65 kg/m   Physical Exam Constitutional:      Appearance: Normal appearance.  HENT:     Head: Normocephalic and atraumatic.     Nose: Nose normal.     Mouth/Throat:     Mouth: Mucous membranes are moist.  Eyes:     Extraocular Movements: Extraocular movements intact.     Pupils: Pupils are equal, round, and reactive to light.  Cardiovascular:     Rate and Rhythm: Normal rate and regular rhythm.     Pulses: Normal pulses.     Heart sounds: Normal heart sounds.  Pulmonary:     Effort: Pulmonary effort is normal.     Breath sounds: Normal breath sounds.  Abdominal:     General: Abdomen is flat. Bowel sounds are normal.     Palpations: Abdomen is soft.  Musculoskeletal:        General: Normal range of motion.     Cervical back: Normal range of motion and neck supple.  Neurological:     General: No focal deficit present.     Mental Status: He is alert and oriented to person, place, and time.  Psychiatric:        Mood and Affect: Mood normal.        Behavior: Behavior normal.     Assessment & Plan:   Problem List Items Addressed This Visit      Digestive   GERD (gastroesophageal reflux disease)     Other   Anxiety   Mixed obsessional thoughts and acts    Other Visit Diagnoses    Encounter to establish care    -  Primary   Tobacco abuse       Panic attack         Encounter to establish care: Care established.  -Patient refused labs as he is scared of needles. -Last labs more than a year ago. Follow-up in 3 months. Will order labs on next visit including hepatitis C screening test -Refused to get COVID-19 vaccine. Up-to-date on Tdap.  OCD/mixed obsessional thoughts and acts/anxiety/panic attacks:  Stable. Continue Zoloft 100 mg once daily. Followed by psychiatrist Dr. Norman Clay outpatient. -Denies depressed mood/suicidal or homicidal thoughts.  Tobacco abuse: Asked:confirms currently smokes cigarettes Assess: Unwilling to quit but cutting back Advise: needs to QUIT to reduce risk of cancer, cardio and cerebrovascular disease Assist: counseled for 5 minutes and literature provided -Prescribed nicotine gum  as per patient's request. Arrange: follow up in 3 months  GERD: Continue PPI.  Outpatient Encounter Medications as of 04/06/2020  Medication Sig  . pantoprazole (PROTONIX) 40 MG tablet Take 1 tablet (40 mg total) by mouth 2 (two) times daily. (Patient taking differently: Take 40 mg by mouth 2 (two) times daily. Does not take everyday)  . sertraline (ZOLOFT) 100 MG tablet Take 1 tablet (100 mg total) by mouth daily.   No facility-administered encounter medications on file as of 04/06/2020.    Follow-up: Return in about 3 months (around 07/07/2020).   Mckinley Jewel, MD

## 2020-04-11 ENCOUNTER — Ambulatory Visit: Payer: Medicaid Other | Admitting: Nurse Practitioner

## 2020-04-13 ENCOUNTER — Telehealth: Payer: Self-pay | Admitting: Internal Medicine

## 2020-04-13 ENCOUNTER — Other Ambulatory Visit: Payer: Self-pay

## 2020-04-13 MED ORDER — NICOTINE POLACRILEX 4 MG MT GUM: 4.0000 mg | CHEWING_GUM | OROMUCOSAL | 0 refills | Status: DC

## 2020-04-13 NOTE — Telephone Encounter (Signed)
Patient aware that medication has been sent in and should be ready in a bit.

## 2020-04-13 NOTE — Telephone Encounter (Signed)
Pt was supposed to have a Rx for nicotine polacrilex (NICORETTE) sent to the [facility] on [address] per his last visit and stated that it isn't there. Please advise

## 2020-04-19 ENCOUNTER — Ambulatory Visit: Payer: Medicaid Other | Admitting: Nurse Practitioner

## 2020-04-19 NOTE — Progress Notes (Signed)
Virtual Visit via Video Note  I connected with Christopher Ahr. on 04/24/20 at 11:20 AM EDT by a video enabled telemedicine application and verified that I am speaking with the correct person using two identifiers.   I discussed the limitations of evaluation and management by telemedicine and the availability of in person appointments. The patient expressed understanding and agreed to proceed.     I discussed the assessment and treatment plan with the patient. The patient was provided an opportunity to ask questions and all were answered. The patient agreed with the plan and demonstrated an understanding of the instructions.   The patient was advised to call back or seek an in-person evaluation if the symptoms worsen or if the condition fails to improve as anticipated.  Location: patient- home, provider- office   I provided 12 minutes of non-face-to-face time during this encounter.   Norman Clay, MD    [facility] MD/PA/NP OP Progress Note  04/24/2020 11:45 AM Christopher Rosine Abe.  MRN:  [id number]  Chief Complaint:  Chief Complaint    Anxiety; Follow-up     HPI:  This is a follow-up appointment for anxiety and OCD.  He states that he has been feeling less anxious since up titration of sertraline.  He had an episode of him needing to remove cheese from pizza as it reminds him of hair. Although he knows that it is not a hair, he cannot help thinking about it. He continues to count things and check door knobs at least a few times, although it has been improved. He reports loose stool for the past two months. He denies significant concern, stating that he has bowel movement once a day on most of the time, although he used to have bowel movement once in a few days.  He has an upcoming appointment with his PCP about this issues. He is trying to look for a job which matches with his son's schedule. He has started to go to early college. He has fair sleep.  He denies feeling depressed.  He has  good concentration.  He has improved appetite.  He denies panic attacks.  He has been able to feel relax.   Visit Diagnosis:    ICD-10-CM   1. Mixed obsessional thoughts and acts  F42.2   2. Anxiety  F41.9     Past Psychiatric History: Please see initial evaluation for full details. I have reviewed the history. No updates at this time.    Past Medical History:  Past Medical History:  Diagnosis Date  . Anxiety    Chronic  . GERD (gastroesophageal reflux disease)   . OCD (obsessive compulsive disorder)     Past Surgical History:  Procedure Laterality Date  . WISDOM TOOTH EXTRACTION      Family Psychiatric History: Please see initial evaluation for full details. I have reviewed the history. No updates at this time.     Family History:  Family History  Problem Relation Age of Onset  . Depression Mother   . Depression Sister   . Bipolar disorder Paternal Uncle   . Schizophrenia Paternal Uncle   . Alcohol abuse Maternal Uncle   . Colon cancer Neg Hx     Social History:  Social History   Socioeconomic History  . Marital status: Significant Other    Spouse name: Not on file  . Number of children: Not on file  . Years of education: Not on file  . Highest education level: Not on file  Occupational History  . Not on file  Tobacco Use  . Smoking status: Current Every Day Smoker    Packs/day: 0.50    Types: Cigarettes  . Smokeless tobacco: Former Systems developer    Types: Snuff  Vaping Use  . Vaping Use: Never used  Substance and Sexual Activity  . Alcohol use: Yes    Comment: 20 ounces daily  . Drug use: No  . Sexual activity: Yes  Other Topics Concern  . Not on file  Social History Narrative  . Not on file   Social Determinants of Health   Financial Resource Strain:   . Difficulty of Paying Living Expenses:   Food Insecurity:   . Worried About Charity fundraiser in the Last Year:   . Arboriculturist in the Last Year:   Transportation Needs:   . Lexicographer (Medical):   Marland Kitchen Lack of Transportation (Non-Medical):   Physical Activity:   . Days of Exercise per Week:   . Minutes of Exercise per Session:   Stress:   . Feeling of Stress :   Social Connections:   . Frequency of Communication with Friends and Family:   . Frequency of Social Gatherings with Friends and Family:   . Attends Religious Services:   . Active Member of Clubs or Organizations:   . Attends Archivist Meetings:   Marland Kitchen Marital Status:     Allergies:  Allergies  Allergen Reactions  . Paroxetine Hcl     Erectile dysfunction    Metabolic Disorder Labs: Lab Results  Component Value Date   HGBA1C 5.2 12/03/2018   MPG 103 12/03/2018   No results found for: PROLACTIN Lab Results  Component Value Date   CHOL 137 03/26/2016   TRIG 111 03/26/2016   HDL 61 03/26/2016   CHOLHDL 2.2 03/26/2016   VLDL 22 03/26/2016   LDLCALC 54 03/26/2016   Lab Results  Component Value Date   TSH 1.02 12/03/2018   TSH 1.39 03/26/2016    Therapeutic Level Labs: No results found for: LITHIUM No results found for: VALPROATE No components found for:  CBMZ  Current Medications: Current Outpatient Medications  Medication Sig Dispense Refill  . pantoprazole (PROTONIX) 40 MG tablet Take 1 tablet (40 mg total) by mouth 2 (two) times daily. (Patient taking differently: Take 40 mg by mouth 2 (two) times daily. Does not take everyday) 60 tablet 3  . [START ON 05/04/2020] sertraline (ZOLOFT) 100 MG tablet Take 1 tablet (100 mg total) by mouth daily. 30 tablet 1   No current facility-administered medications for this visit.     Musculoskeletal: Strength & Muscle Tone: N/A Gait & Station: N/A Patient leans: N/A  Psychiatric Specialty Exam: Review of Systems  Psychiatric/Behavioral: Negative for agitation, behavioral problems, confusion, decreased concentration, dysphoric mood and hallucinations. The patient is nervous/anxious. The patient is not hyperactive.   All  other systems reviewed and are negative.   There were no vitals taken for this visit.There is no height or weight on file to calculate BMI.  General Appearance: Fairly Groomed  Eye Contact:  Good  Speech:  Clear and Coherent  Volume:  Normal  Mood:  Anxious  Affect:  Appropriate, Congruent and euthymic  Thought Process:  Coherent  Orientation:  Full (Time, Place, and Person)  Thought Content: Logical   Suicidal Thoughts:  No  Homicidal Thoughts:  No  Memory:  Immediate;   Good  Judgement:  Good  Insight:  Fair  Psychomotor  Activity:  Normal  Concentration:  Concentration: Good and Attention Span: Good  Recall:  Good  Fund of Knowledge: Good  Language: Good  Akathisia:  No  Handed:  Right  AIMS (if indicated): not done  Assets:  Communication Skills Desire for Improvement  ADL's:  Intact  Cognition: WNL  Sleep:  Good   Screenings: PHQ2-9     Office Visit from 04/20/2020 in [facility] Office Visit from 04/06/2020 in [facility] Office Visit from 06/11/2018 in [facility]  PHQ-2 Total Score 0 1 0  PHQ-9 Total Score -- 3 --       Assessment and Plan:  Christopher Shartzer. is a 44 y.o. year old male with a history of OCD, anxiety, who presents for follow up appointment for below.   1. Mixed obsessional thoughts and acts 2. Anxiety Although there has been overall improvement in anxiety, and OCD symptoms, he reports loose stools since up titration of sertraline.  After having discussion with the patient, will continue the current dose of sertraline to target OCD and anxiety especially given he has an upcoming appointment with his primary care for further evaluation.  Will consider switching to other antidepressant if any worsening in GI symptoms.   Plan 1.Continuesertraline100 mg daily  2.Next appointment:8/16 at 11:20 for 20 mins, video  Past trials of medication:Paxil,  citalopram,lexapro,fluoxetine,venlafaxine(drowsiness),clomipramine,bupropion, Xanax (memory loss), clonazepam (drowsy)  The patient demonstrates the following risk factors for suicide: Chronic risk factors for suicide include:psychiatric disorder ofanxiety. Acute risk factorsfor suicide include: N/A. Protective factorsfor this patient include: positive social support, responsibility to others (children, family), coping skills and hope for the future. Considering these factors, the overall suicide risk at this point appears to below. Patientisappropriate for outpatient follow up.   Norman Clay, MD 04/24/2020, 11:45 AM

## 2020-04-20 ENCOUNTER — Other Ambulatory Visit: Payer: Self-pay

## 2020-04-20 ENCOUNTER — Ambulatory Visit (INDEPENDENT_AMBULATORY_CARE_PROVIDER_SITE_OTHER): Payer: Medicaid Other | Admitting: Internal Medicine

## 2020-04-20 ENCOUNTER — Encounter: Payer: Self-pay | Admitting: Internal Medicine

## 2020-04-20 VITALS — BP 118/74 | HR 89 | Temp 98.6°F | Ht 72.0 in | Wt 191.4 lb

## 2020-04-20 DIAGNOSIS — F1010 Alcohol abuse, uncomplicated: Secondary | ICD-10-CM

## 2020-04-20 DIAGNOSIS — R1033 Periumbilical pain: Secondary | ICD-10-CM

## 2020-04-20 NOTE — Progress Notes (Signed)
Established Patient Office Visit  Subjective:  Patient ID: Christopher Ahr., male    DOB: 02/14/1976  Age: 44 y.o. MRN: [id number]  CC:  Chief Complaint  Patient presents with  . Belly Button irratation    HPI Christopher Wyatt. is a 44 year old male with past medical history of severe OCD, GERD, anxiety, panic attacks, tobacco abuse, alcohol abuse presents in clinic with complaint of periumbilical pain since 1 week.  Patient tells me that last week he noticed a bump/pain/drainage and irritation in his bellybutton which he tried to touch with his pinky finger.  Reports periumbilical pain which is intermittent, gas type of pain, 4 out of 10, nonradiating, aggravates with pushing his abdomen with his hand and empty stomach and relieved with eating food, associated with bloating.  Reports loose stools since 2 months however denies association with melena, hematemesis, vomiting, decreased appetite, weight loss, night sweats.  He continues to smokes 1 to 1-1/2 pack of cigarettes per day, drinks 2 to 3 glasses of wine almost every night.  He has phobia with IV injections-initially he refused for labs however then he agreed.  He tells me that he had a urea breath test done a year ago which came back negative and he was placed on PPI by his previous PCP.  He thinks that his symptoms are related to Zoloft.  He denies any urinary symptoms including dysuria, hematuria, back pain, fever, chills, change in urinary frequency.  Past Medical History:  Diagnosis Date  . Anxiety    Chronic  . GERD (gastroesophageal reflux disease)   . OCD (obsessive compulsive disorder)     Past Surgical History:  Procedure Laterality Date  . WISDOM TOOTH EXTRACTION      Family History  Problem Relation Age of Onset  . Depression Mother   . Depression Sister   . Bipolar disorder Paternal Uncle   . Schizophrenia Paternal Uncle   . Alcohol abuse Maternal Uncle   . Colon cancer Neg Hx     Social  History   Socioeconomic History  . Marital status: Significant Other    Spouse name: Not on file  . Number of children: Not on file  . Years of education: Not on file  . Highest education level: Not on file  Occupational History  . Not on file  Tobacco Use  . Smoking status: Current Every Day Smoker    Packs/day: 0.50    Types: Cigarettes  . Smokeless tobacco: Former Systems developer    Types: Snuff  Vaping Use  . Vaping Use: Never used  Substance and Sexual Activity  . Alcohol use: Yes    Comment: 20 ounces daily  . Drug use: No  . Sexual activity: Yes  Other Topics Concern  . Not on file  Social History Narrative  . Not on file   Social Determinants of Health   Financial Resource Strain:   . Difficulty of Paying Living Expenses:   Food Insecurity:   . Worried About Charity fundraiser in the Last Year:   . Arboriculturist in the Last Year:   Transportation Needs:   . Film/video editor (Medical):   Marland Kitchen Lack of Transportation (Non-Medical):   Physical Activity:   . Days of Exercise per Week:   . Minutes of Exercise per Session:   Stress:   . Feeling of Stress :   Social Connections:   . Frequency of Communication with Friends and Family:   . Frequency  of Social Gatherings with Friends and Family:   . Attends Religious Services:   . Active Member of Clubs or Organizations:   . Attends Archivist Meetings:   Marland Kitchen Marital Status:   Intimate Partner Violence:   . Fear of Current or Ex-Partner:   . Emotionally Abused:   Marland Kitchen Physically Abused:   . Sexually Abused:     Outpatient Medications Prior to Visit  Medication Sig Dispense Refill  . pantoprazole (PROTONIX) 40 MG tablet Take 1 tablet (40 mg total) by mouth 2 (two) times daily. (Patient taking differently: Take 40 mg by mouth 2 (two) times daily. Does not take everyday) 60 tablet 3  . sertraline (ZOLOFT) 100 MG tablet Take 1 tablet (100 mg total) by mouth daily. 30 tablet 1  . nicotine polacrilex (NICORETTE) 4  MG gum Take 1 each (4 mg total) by mouth as directed. Every four hours while awake (Patient not taking: Reported on 04/20/2020) 100 tablet 0   No facility-administered medications prior to visit.    Allergies  Allergen Reactions  . Paroxetine Hcl     Erectile dysfunction    ROS Review of Systems  Constitutional: Negative.   HENT: Negative.   Eyes: Negative.   Respiratory: Negative.   Cardiovascular: Negative.   Gastrointestinal: Positive for abdominal pain.  Endocrine: Negative.   Genitourinary: Negative.   Musculoskeletal: Negative.   Allergic/Immunologic: Negative.   Neurological: Negative.   Hematological: Negative.   Psychiatric/Behavioral: Negative.       Objective:    Physical Exam Constitutional:      Appearance: Normal appearance.  HENT:     Head: Normocephalic and atraumatic.     Nose: Nose normal.     Mouth/Throat:     Mouth: Mucous membranes are moist.  Eyes:     Extraocular Movements: Extraocular movements intact.     Conjunctiva/sclera: Conjunctivae normal.     Pupils: Pupils are equal, round, and reactive to light.  Cardiovascular:     Rate and Rhythm: Normal rate and regular rhythm.     Pulses: Normal pulses.     Heart sounds: Normal heart sounds.  Pulmonary:     Effort: Pulmonary effort is normal.     Breath sounds: Normal breath sounds.  Abdominal:     General: Abdomen is flat. Bowel sounds are normal.     Palpations: Abdomen is soft.     Comments: Mild periumbilical tenderness positive, no guarding, no rigidit  Musculoskeletal:        General: Normal range of motion.  Neurological:     General: No focal deficit present.     Mental Status: He is alert and oriented to person, place, and time.  Psychiatric:        Mood and Affect: Mood normal.     BP 118/74 (BP Location: Left Arm, Patient Position: Sitting, Cuff Size: Normal)   Pulse 89   Temp 98.6 F (37 C) (Temporal)   Ht 6' (1.829 m)   Wt 191 lb 6.4 oz (86.8 kg)   SpO2 94%   BMI  25.96 kg/m  Wt Readings from Last 3 Encounters:  04/20/20 191 lb 6.4 oz (86.8 kg)  04/06/20 189 lb 1.9 oz (85.8 kg)  12/09/19 185 lb 9.6 oz (84.2 kg)     Health Maintenance Due  Topic Date Due  . Hepatitis C Screening  Never done  . TETANUS/TDAP  Never done  . INFLUENZA VACCINE  04/09/2020    There are no preventive care reminders to  display for this patient.  Lab Results  Component Value Date   TSH 1.02 12/03/2018   Lab Results  Component Value Date   WBC 9.4 12/03/2018   HGB 17.7 (H) 12/03/2018   HCT 52.0 (H) 12/03/2018   MCV 90.4 12/03/2018   PLT 248 12/03/2018   Lab Results  Component Value Date   NA 139 12/03/2018   K 5.2 12/03/2018   CO2 22 12/03/2018   GLUCOSE 88 12/03/2018   BUN 6 (L) 12/03/2018   CREATININE 0.90 12/03/2018   BILITOT 0.9 12/03/2018   ALKPHOS 54 03/26/2016   AST 21 12/03/2018   ALT 21 12/03/2018   PROT 7.3 12/03/2018   ALBUMIN 4.8 03/26/2016   CALCIUM 10.2 12/03/2018   Lab Results  Component Value Date   CHOL 137 03/26/2016   Lab Results  Component Value Date   HDL 61 03/26/2016   Lab Results  Component Value Date   LDLCALC 54 03/26/2016   Lab Results  Component Value Date   TRIG 111 03/26/2016   Lab Results  Component Value Date   CHOLHDL 2.2 03/26/2016   Lab Results  Component Value Date   HGBA1C 5.2 12/03/2018      Assessment & Plan:   Problem List Items Addressed This Visit    None    Visit Diagnoses    Periumbilical pain    -  Primary     Periumbilical pain: Differential diagnosis including gastritis/duodenal ulcer/pancreatitis -His pain aggravates with empty stomach and relieved with eating food.  No melena or hematemesis. smokes 1 to 1-1/2 packs of cigarettes and drinks 2 to 3 glasses of wine almost every night. -We will check CBC, CMP, lipid panel, UA, lipase, A1c -Tylenol as needed for pain. -Discussed in details regarding cessation of tobacco and alcohol and he verbalized understanding. -Encourage  increase fluid intake -Cannot get urea breath test as patient takes PPI -If symptoms persist or get worse-we will get CT abdomen for further evaluation and management. -Follow-up in 2 weeks.  No orders of the defined types were placed in this encounter.   Follow-up: Return in about 2 weeks (around 05/04/2020).    Mckinley Jewel, MD

## 2020-04-20 NOTE — Patient Instructions (Signed)
Follow up in 2 weeks if symptoms does not improve Avoid smoking & alcohol Drink increase amount of fluid Tylenol as needed Check CBC, CMP, lipase, Lipid panel, UA, A1c today

## 2020-04-24 ENCOUNTER — Encounter (HOSPITAL_COMMUNITY): Payer: Self-pay | Admitting: Psychiatry

## 2020-04-24 ENCOUNTER — Telehealth (INDEPENDENT_AMBULATORY_CARE_PROVIDER_SITE_OTHER): Payer: Medicaid Other | Admitting: Psychiatry

## 2020-04-24 ENCOUNTER — Other Ambulatory Visit: Payer: Self-pay

## 2020-04-24 DIAGNOSIS — F422 Mixed obsessional thoughts and acts: Secondary | ICD-10-CM | POA: Diagnosis not present

## 2020-04-24 DIAGNOSIS — F419 Anxiety disorder, unspecified: Secondary | ICD-10-CM

## 2020-04-24 MED ORDER — SERTRALINE HCL 100 MG PO TABS: 100.0000 mg | ORAL_TABLET | Freq: Every day | ORAL | 1 refills | Status: DC

## 2020-05-24 ENCOUNTER — Telehealth: Payer: Self-pay

## 2020-05-24 NOTE — Telephone Encounter (Signed)
Patient scheduled for phone visit with Dr.Patel tomorrow morning to discuss. Does not want to establish care with new provider at the moment.

## 2020-05-24 NOTE — Telephone Encounter (Signed)
Called patient to discuss patches. Will need an appt since he isnt currently on them. Asked patient to call back

## 2020-05-24 NOTE — Telephone Encounter (Signed)
Pt called wanting Nicotine patches to help him quit smoking

## 2020-05-25 ENCOUNTER — Other Ambulatory Visit: Payer: Self-pay

## 2020-05-25 ENCOUNTER — Encounter: Payer: Self-pay | Admitting: Internal Medicine

## 2020-05-25 ENCOUNTER — Telehealth (INDEPENDENT_AMBULATORY_CARE_PROVIDER_SITE_OTHER): Payer: Medicaid Other | Admitting: Internal Medicine

## 2020-05-25 VITALS — Ht 72.0 in | Wt 188.0 lb

## 2020-05-25 DIAGNOSIS — F331 Major depressive disorder, recurrent, moderate: Secondary | ICD-10-CM

## 2020-05-25 DIAGNOSIS — K219 Gastro-esophageal reflux disease without esophagitis: Secondary | ICD-10-CM | POA: Diagnosis not present

## 2020-05-25 DIAGNOSIS — Z716 Tobacco abuse counseling: Secondary | ICD-10-CM

## 2020-05-25 DIAGNOSIS — F17200 Nicotine dependence, unspecified, uncomplicated: Secondary | ICD-10-CM | POA: Diagnosis not present

## 2020-05-25 DIAGNOSIS — Z20822 Contact with and (suspected) exposure to covid-19: Secondary | ICD-10-CM

## 2020-05-25 MED ORDER — NICOTINE 14 MG/24HR TD PT24: 14.0000 mg | MEDICATED_PATCH | Freq: Every day | TRANSDERMAL | 1 refills | Status: DC

## 2020-05-25 MED ORDER — BUPROPION HCL ER (SR) 150 MG PO TB12: 150.0000 mg | ORAL_TABLET | Freq: Two times a day (BID) | ORAL | 1 refills | Status: DC

## 2020-05-25 NOTE — Progress Notes (Signed)
Virtual Visit via Telephone Note   This visit type was conducted due to national recommendations for restrictions regarding the COVID-19 Pandemic (e.g. social distancing) in an effort to limit this patient's exposure and mitigate transmission in our community.  Due to his co-morbid illnesses, this patient is at least at moderate risk for complications without adequate follow up.  This format is felt to be most appropriate for this patient at this time.  The patient did not have access to video technology/had technical difficulties with video requiring transitioning to audio format only (telephone).  All issues noted in this document were discussed and addressed.  No physical exam could be performed with this format.  Please refer to the patient's chart for his  consent to telehealth for [facility].   Evaluation Performed:  Follow-up visit  Date:  05/25/2020   ID:  Christopher Ahr., DOB 11/12/1975, MRN [id number]  Patient Location: Home Provider Location: Office/Clinic  Location of Patient: Home Location of Provider: Telehealth I verified that I am speaking with the correct person using two identifiers.  PCP:  Mckinley Jewel, MD   Chief Complaint:  Smoking cessation counseling  History of Present Illness:    Christopher Bomkamp. is a 44 y.o. male with PMH of depression and GERD wants to discuss smoking cessation options. He has tried to cut down the number of cigarettes (2 packs to 1 pack/day now). Patient has tried Chantix in the past, had vivid dreams and mood swings with those and wants to avoid it. Patient denies any history of seizure disorder.  Counseled and provided options for smoking cessation. Explained in detail the use of medication along with Nicotine supplements.  The patient does not have symptoms concerning for COVID-19 infection (fever, chills, cough, or new shortness of breath).   Past Medical, Surgical, Social History, Allergies, and Medications have  been Reviewed.  Past Medical History:  Diagnosis Date  . Anxiety    Chronic  . GERD (gastroesophageal reflux disease)   . OCD (obsessive compulsive disorder)    Past Surgical History:  Procedure Laterality Date  . WISDOM TOOTH EXTRACTION       Current Meds  Medication Sig  . pantoprazole (PROTONIX) 40 MG tablet Take 1 tablet (40 mg total) by mouth 2 (two) times daily. (Patient taking differently: Take 40 mg by mouth 2 (two) times daily. Does not take everyday)  . sertraline (ZOLOFT) 100 MG tablet Take 1 tablet (100 mg total) by mouth daily.     Allergies:   Paroxetine hcl   ROS:   Please see the history of present illness.    All other systems reviewed and are negative.   Labs/Other Tests and Data Reviewed:    Recent Labs: No results found for requested labs within last 8760 hours.   Recent Lipid Panel Lab Results  Component Value Date/Time   CHOL 137 03/26/2016 04:50 PM   TRIG 111 03/26/2016 04:50 PM   HDL 61 03/26/2016 04:50 PM   CHOLHDL 2.2 03/26/2016 04:50 PM   LDLCALC 54 03/26/2016 04:50 PM    Wt Readings from Last 3 Encounters:  05/25/20 188 lb (85.3 kg)  04/20/20 191 lb 6.4 oz (86.8 kg)  04/06/20 189 lb 1.9 oz (85.8 kg)     Objective:    Vital Signs:  Ht 6' (1.829 m)   Wt 188 lb (85.3 kg)   BMI 25.50 kg/m     ASSESSMENT & PLAN:   Tobacco use disorder Interested  in smoking cessation Counseled and provided options Has tried Chantix in the past, had vivid dreams and mood swings, refused to use it Will start Bupropion, denies any history of seizures  GERD (gastroesophageal reflux disease) Continue Pantoprazole  MDD (major depressive disorder) Stable with Zoloft    Time:   Today, I have spent 15 minutes with the patient with telehealth technology discussing the above problems.     Medication Adjustments/Labs and Tests Ordered: Current medicines are reviewed at length with the patient today.  Concerns regarding medicines are outlined  above.  Tests Ordered: No orders of the defined types were placed in this encounter.   Medication Changes: Meds ordered this encounter  Medications  . buPROPion (WELLBUTRIN SR) 150 MG 12 hr tablet    Sig: Take 1 tablet (150 mg total) by mouth 2 (two) times daily. Start with 150 mg QD for 3 days and then increase to BID.    Dispense:  60 tablet    Refill:  1  . nicotine (NICODERM CQ - DOSED IN MG/24 HOURS) 14 mg/24hr patch    Sig: Place 1 patch (14 mg total) onto the skin daily.    Dispense:  28 patch    Refill:  1    Disposition:  Follow up  Signed, Lindell Spar, MD  05/25/2020 11:41 AM     [place]

## 2020-05-25 NOTE — Assessment & Plan Note (Signed)
Continue Pantoprazole.

## 2020-05-25 NOTE — Assessment & Plan Note (Signed)
Interested in smoking cessation Counseled and provided options Has tried Chantix in the past, had vivid dreams and mood swings, refused to use it Will start Bupropion, denies any history of seizures

## 2020-05-25 NOTE — Assessment & Plan Note (Signed)
Stable with Zoloft 

## 2020-05-25 NOTE — Patient Instructions (Addendum)
Steps to Quit Smoking Smoking tobacco is the leading cause of preventable death. It can affect almost every organ in the body. Smoking puts you and those around you at risk for developing many serious chronic diseases. Quitting smoking can be difficult, but it is one of the best things that you can do for your health. It is never too late to quit. What first steps can I take to quit smoking?  Throw away all cigarettes at home, at work, and in your car.  Throw away smoking accessories, such as Scientist, research (medical).  Clean your car. Make sure to empty the ashtray.  Clean your home, including curtains and carpets. What strategies can I use to quit smoking? Talk with your health care provider about combining strategies, such as taking medicines while you are also receiving in-person counseling. Using these two strategies together makes you more likely to succeed in quitting than if you used either strategy on its own.  If you are pregnant or breastfeeding, talk with your health care provider about finding counseling or other support strategies to quit smoking. Do not take medicine to help you quit smoking unless your health care provider tells you to do so. To quit smoking: Quit right away  Quit smoking completely, instead of gradually reducing how much you smoke over a period of time. Research shows that stopping smoking right away is more successful than gradually quitting.  Attend in-person counseling to help you build problem-solving skills. You are more likely to succeed in quitting if you attend counseling sessions regularly. Even short sessions of 10 minutes can be effective. Take medicine You may take medicines to help you quit smoking. Some medicines require a prescription and some you can purchase over-the-counter. Medicines may have nicotine in them to replace the nicotine in cigarettes. Medicines may:  Help to stop cravings.  Help to relieve withdrawal symptoms. Your health care  provider may recommend:  Nicotine patches, gum, or lozenges.  Nicotine inhalers or sprays.  Non-nicotine medicine that is taken by mouth. Find resources Find resources and support systems that can help you to quit smoking and remain smoke-free after you quit. These resources are most helpful when you use them often. They include:  Online chats with a Social worker.  Telephone quitlines.  Printed Furniture conservator/restorer.  Support groups or group counseling.  Text messaging programs.  Mobile phone apps or applications. Use apps that can help you stick to your quit plan by providing reminders, tips, and encouragement. There are many free apps for mobile devices as well as websites. Examples include Quit Guide from the [employer] and smokefree.gov What things can I do to make it easier to quit?   Reach out to your family and friends for support and encouragement. Call telephone quitlines (1-800-QUIT-NOW), reach out to support groups, or work with a counselor for support.  Ask people who smoke to avoid smoking around you.  Avoid places that trigger you to smoke, such as bars, parties, or smoke-break areas at work.  Spend time with people who do not smoke.  Lessen the stress in your life. Stress can be a smoking trigger for some people. To lessen stress, try: ? Exercising regularly. ? Doing deep-breathing exercises. ? Doing yoga. ? Meditating. ? Performing a body scan. This involves closing your eyes, scanning your body from head to toe, and noticing which parts of your body are particularly tense. Try to relax the muscles in those areas. How will I feel when I quit smoking? Day 1  to 3 weeks Within the first 24 hours of quitting smoking, you may start to feel withdrawal symptoms. These symptoms are usually most noticeable 2-3 days after quitting, but they usually do not last for more than 2-3 weeks. You may experience these symptoms:  Mood swings.  Restlessness, anxiety, or  irritability.  Trouble concentrating.  Dizziness.  Strong cravings for sugary foods and nicotine.  Mild weight gain.  Constipation.  Nausea.  Coughing or a sore throat.  Changes in how the medicines that you take for unrelated issues work in your body.  Depression.  Trouble sleeping (insomnia). Week 3 and afterward After the first 2-3 weeks of quitting, you may start to notice more positive results, such as:  Improved sense of smell and taste.  Decreased coughing and sore throat.  Slower heart rate.  Lower blood pressure.  Clearer skin.  The ability to breathe more easily.  Fewer sick days. Quitting smoking can be very challenging. Do not get discouraged if you are not successful the first time. Some people need to make many attempts to quit before they achieve long-term success. Do your best to stick to your quit plan, and talk with your health care provider if you have any questions or concerns. Summary  Smoking tobacco is the leading cause of preventable death. Quitting smoking is one of the best things that you can do for your health.  When you decide to quit smoking, create a plan to help you succeed.  Quit smoking right away, not slowly over a period of time.  When you start quitting, seek help from your health care provider, family, or friends. This information is not intended to replace advice given to you by your health care provider. Make sure you discuss any questions you have with your health care provider. Document Revised: 05/21/2019 Document Reviewed: 11/14/2018 Elsevier Patient Education  [place].

## 2020-05-27 LAB — SARS-COV-2, NAA 2 DAY TAT

## 2020-05-27 LAB — NOVEL CORONAVIRUS, NAA: SARS-CoV-2, NAA: NOT DETECTED

## 2020-06-09 NOTE — Progress Notes (Signed)
Virtual Visit via Video Note  I connected with Christopher Ahr. on 06/12/20 at 11:10 AM EDT by a video enabled telemedicine application and verified that I am speaking with the correct person using two identifiers.   I discussed the limitations of evaluation and management by telemedicine and the availability of in person appointments. The patient expressed understanding and agreed to proceed.   I discussed the assessment and treatment plan with the patient. The patient was provided an opportunity to ask questions and all were answered. The patient agreed with the plan and demonstrated an understanding of the instructions.   The patient was advised to call back or seek an in-person evaluation if the symptoms worsen or if the condition fails to improve as anticipated.  Location: patient- home, provider- office   I provided 11 minutes of non-face-to-face time during this encounter.   Norman Clay, MD    [facility] MD/PA/NP OP Progress Note  06/12/2020 11:34 AM Christopher Ahr.  MRN:  [id number]  Chief Complaint:  Chief Complaint    Follow-up; Anxiety     HPI:  - he was started on bupropion for smoking cessation This is a follow-up appointment for OCD and anxiety.  He attended late for the appointment.  He states that he has started to have diarrhea this morning, and has abdominal pain.  He also states that he has some swelling in his back of his neck.  He is advised to see PCP.  He states that his anxiety has been the same.  He continues to have fair appetite; he repetitively has thoughts that the food resembles certain animals while eating, although he knows that it is not true.  He had significant anxiety when he thought that he needs to drive his son to certain place.  He continues to repeatedly check doors.  He denies insomnia.  He has good concentration.  He denies irritability.  He denies panic attacks. He agrees to stay on the same dose of sertraline at this time.   Visit  Diagnosis:    ICD-10-CM   1. Mixed obsessional thoughts and acts  F42.2   2. Anxiety  F41.9     Past Psychiatric History: Please see initial evaluation for full details. I have reviewed the history. No updates at this time.     Past Medical History:  Past Medical History:  Diagnosis Date  . Anxiety    Chronic  . GERD (gastroesophageal reflux disease)   . OCD (obsessive compulsive disorder)     Past Surgical History:  Procedure Laterality Date  . WISDOM TOOTH EXTRACTION      Family Psychiatric History: Please see initial evaluation for full details. I have reviewed the history. No updates at this time.     Family History:  Family History  Problem Relation Age of Onset  . Depression Mother   . Depression Sister   . Bipolar disorder Paternal Uncle   . Schizophrenia Paternal Uncle   . Alcohol abuse Maternal Uncle   . Colon cancer Neg Hx     Social History:  Social History   Socioeconomic History  . Marital status: Significant Other    Spouse name: Not on file  . Number of children: Not on file  . Years of education: Not on file  . Highest education level: Not on file  Occupational History  . Not on file  Tobacco Use  . Smoking status: Current Every Day Smoker    Packs/day: 0.50    Types:  Cigarettes  . Smokeless tobacco: Former Systems developer    Types: Snuff  Vaping Use  . Vaping Use: Never used  Substance and Sexual Activity  . Alcohol use: Yes    Comment: 20 ounces daily  . Drug use: No  . Sexual activity: Yes  Other Topics Concern  . Not on file  Social History Narrative  . Not on file   Social Determinants of Health   Financial Resource Strain:   . Difficulty of Paying Living Expenses: Not on file  Food Insecurity:   . Worried About Charity fundraiser in the Last Year: Not on file  . Ran Out of Food in the Last Year: Not on file  Transportation Needs:   . Lack of Transportation (Medical): Not on file  . Lack of Transportation (Non-Medical): Not on  file  Physical Activity:   . Days of Exercise per Week: Not on file  . Minutes of Exercise per Session: Not on file  Stress:   . Feeling of Stress : Not on file  Social Connections:   . Frequency of Communication with Friends and Family: Not on file  . Frequency of Social Gatherings with Friends and Family: Not on file  . Attends Religious Services: Not on file  . Active Member of Clubs or Organizations: Not on file  . Attends Archivist Meetings: Not on file  . Marital Status: Not on file    Allergies:  Allergies  Allergen Reactions  . Paroxetine Hcl     Erectile dysfunction    Metabolic Disorder Labs: Lab Results  Component Value Date   HGBA1C 5.2 12/03/2018   MPG 103 12/03/2018   No results found for: PROLACTIN Lab Results  Component Value Date   CHOL 137 03/26/2016   TRIG 111 03/26/2016   HDL 61 03/26/2016   CHOLHDL 2.2 03/26/2016   VLDL 22 03/26/2016   LDLCALC 54 03/26/2016   Lab Results  Component Value Date   TSH 1.02 12/03/2018   TSH 1.39 03/26/2016    Therapeutic Level Labs: No results found for: LITHIUM No results found for: VALPROATE No components found for:  CBMZ  Current Medications: Current Outpatient Medications  Medication Sig Dispense Refill  . buPROPion (WELLBUTRIN SR) 150 MG 12 hr tablet Take 1 tablet (150 mg total) by mouth 2 (two) times daily. Start with 150 mg QD for 3 days and then increase to BID. 60 tablet 1  . nicotine (NICODERM CQ - DOSED IN MG/24 HOURS) 14 mg/24hr patch Place 1 patch (14 mg total) onto the skin daily. 28 patch 1  . pantoprazole (PROTONIX) 40 MG tablet Take 1 tablet (40 mg total) by mouth 2 (two) times daily. (Patient taking differently: Take 40 mg by mouth 2 (two) times daily. Does not take everyday) 60 tablet 3  . [START ON 06/22/2020] sertraline (ZOLOFT) 100 MG tablet Take 1 tablet (100 mg total) by mouth daily. 30 tablet 1   No current facility-administered medications for this visit.      Musculoskeletal: Strength & Muscle Tone: N/A Gait & Station: N/A Patient leans: N/A  Psychiatric Specialty Exam: Review of Systems  Psychiatric/Behavioral: Negative for agitation, behavioral problems, confusion, decreased concentration, dysphoric mood, hallucinations, self-injury, sleep disturbance and suicidal ideas. The patient is nervous/anxious. The patient is not hyperactive.   All other systems reviewed and are negative.   There were no vitals taken for this visit.There is no height or weight on file to calculate BMI.  General Appearance: Fairly Groomed  Eye Contact:  Good  Speech:  Clear and Coherent  Volume:  Normal  Mood:  "same:  Affect:  Appropriate and Congruent  Thought Process:  Coherent  Orientation:  Full (Time, Place, and Person)  Thought Content: Logical   Suicidal Thoughts:  No  Homicidal Thoughts:  No  Memory:  Immediate;   Good  Judgement:  Good  Insight:  Fair  Psychomotor Activity:  Normal  Concentration:  Concentration: Good and Attention Span: Good  Recall:  Good  Fund of Knowledge: Good  Language: Good  Akathisia:  No  Handed:  Right  AIMS (if indicated): not done  Assets:  Communication Skills Desire for Improvement  ADL's:  Intact  Cognition: WNL  Sleep:  Good   Screenings: PHQ2-9     Video Visit from 05/25/2020 in [facility] Office Visit from 04/20/2020 in [facility] Office Visit from 04/06/2020 in [facility] Office Visit from 06/11/2018 in [facility]  PHQ-2 Total Score 0 0 1 0  PHQ-9 Total Score -- -- 3 --       Assessment and Plan:  Christopher Hardman. is a 44 y.o. year old male with a history of OCD, anxiety, who presents for follow up appointment for below.   1. Mixed obsessional thoughts and acts 2. Anxiety Although he continues to have OCD symptoms and anxiety, there has been overall improvement in his symptoms.  Although he will benefit from further up titration of  sertraline, will not do this at this time given his new GI symptoms today, which is more attributable to his physical condition.  We will continue current dose of sertraline to target OCD and anxiety.  Will consider switching to another antidepressant if any worsening in GI symptoms.   Plan 1.Continuesertraline100mg  daily - monitor loose stools 2.Next appointment:11/22 at 11 AM for 30 mins, video  Past trials of medication:Paxil, citalopram,lexapro,fluoxetine,venlafaxine(drowsiness),clomipramine,bupropion, Xanax (memory loss), clonazepam (drowsy)  The patient demonstrates the following risk factors for suicide: Chronic risk factors for suicide include:psychiatric disorder ofanxiety. Acute risk factorsfor suicide include: N/A. Protective factorsfor this patient include: positive social support, responsibility to others (children, family), coping skills and hope for the future. Considering these factors, the overall suicide risk at this point appears to below. Patientisappropriate for outpatient follow up.   Norman Clay, MD 06/12/2020, 11:34 AM

## 2020-06-12 ENCOUNTER — Other Ambulatory Visit: Payer: Self-pay

## 2020-06-12 ENCOUNTER — Telehealth (INDEPENDENT_AMBULATORY_CARE_PROVIDER_SITE_OTHER): Payer: Medicaid Other | Admitting: Psychiatry

## 2020-06-12 ENCOUNTER — Encounter (HOSPITAL_COMMUNITY): Payer: Self-pay | Admitting: Psychiatry

## 2020-06-12 DIAGNOSIS — F422 Mixed obsessional thoughts and acts: Secondary | ICD-10-CM

## 2020-06-12 DIAGNOSIS — F419 Anxiety disorder, unspecified: Secondary | ICD-10-CM

## 2020-06-12 MED ORDER — SERTRALINE HCL 100 MG PO TABS
100.0000 mg | ORAL_TABLET | Freq: Every day | ORAL | 1 refills | Status: DC
Start: 2020-06-22 — End: 2020-12-27

## 2020-06-12 NOTE — Patient Instructions (Signed)
1.Continuesertraline100mg  daily  2.Next appointment:11/22 at 11 AM

## 2020-06-13 ENCOUNTER — Other Ambulatory Visit: Payer: Self-pay

## 2020-06-13 ENCOUNTER — Ambulatory Visit (INDEPENDENT_AMBULATORY_CARE_PROVIDER_SITE_OTHER): Payer: Medicaid Other | Admitting: Internal Medicine

## 2020-06-13 ENCOUNTER — Encounter: Payer: Self-pay | Admitting: Internal Medicine

## 2020-06-13 VITALS — BP 123/84 | HR 95 | Temp 97.8°F | Resp 18 | Ht 72.0 in | Wt 191.0 lb

## 2020-06-13 DIAGNOSIS — M542 Cervicalgia: Secondary | ICD-10-CM | POA: Insufficient documentation

## 2020-06-13 DIAGNOSIS — R519 Headache, unspecified: Secondary | ICD-10-CM

## 2020-06-13 DIAGNOSIS — F17200 Nicotine dependence, unspecified, uncomplicated: Secondary | ICD-10-CM | POA: Insufficient documentation

## 2020-06-13 DIAGNOSIS — K219 Gastro-esophageal reflux disease without esophagitis: Secondary | ICD-10-CM

## 2020-06-13 DIAGNOSIS — G609 Hereditary and idiopathic neuropathy, unspecified: Secondary | ICD-10-CM

## 2020-06-13 NOTE — Progress Notes (Signed)
Acute Office Visit  Subjective:    Patient ID: Christopher Ahr., male    DOB: August 01, 1976, 44 y.o.   MRN: [id number]  Chief Complaint  Patient presents with  . Acute Visit    pt having pain in back side of head been going on for a few weeks also sometimes he has pain in right leg this has been going on for several weeks sometimes he cant feel it. Had to stop wellbutrin started feeling spacy     HPI Christopher Jeffreys. is a 44 y.o. male with PMH of anxiety, GERD and tobacco abuse presented with a c/o neck pain. Pain is chronic, dull, intermittent and is aggravated by movement. He states that he has noticed a bump in the back of the neck at the hairline. He visits a chiropractor and the bump and pain resolve with the manipulation. He denies numbness, weakness or tingling in the UE.  But for last few days, he has noticed headache in the occipital region, which is dull, intermittent, nonradiating with no aggravating or alleviating factors. He denies dizziness or gait imbalance. He denies weight loss, vision changes, hearing problems or vomiting. He denies photophobia or phonophobia.  Patient also c/o occasional numbness on the lateral side of the right leg, which resolves on its own. No symptoms over the foot. Denies any recent injury.  Patient has a h/o smoking and was started on Bupropion for smoking cessation help. He stopped taking the medication as he was feeling slow and fatigues with it.  He also c/o heartburn, but states that he has not been taking Pantoprazole twice daily as prescribed.      Past Medical History:  Diagnosis Date  . Anxiety    Chronic  . GERD (gastroesophageal reflux disease)   . OCD (obsessive compulsive disorder)     Past Surgical History:  Procedure Laterality Date  . WISDOM TOOTH EXTRACTION      Family History  Problem Relation Age of Onset  . Depression Mother   . Depression Sister   . Bipolar disorder Paternal Uncle   . Schizophrenia  Paternal Uncle   . Alcohol abuse Maternal Uncle   . Colon cancer Neg Hx     Social History   Socioeconomic History  . Marital status: Significant Other    Spouse name: Not on file  . Number of children: Not on file  . Years of education: Not on file  . Highest education level: Not on file  Occupational History  . Not on file  Tobacco Use  . Smoking status: Current Every Day Smoker    Packs/day: 0.50    Types: Cigarettes  . Smokeless tobacco: Former Systems developer    Types: Snuff  Vaping Use  . Vaping Use: Never used  Substance and Sexual Activity  . Alcohol use: Yes    Comment: 20 ounces daily  . Drug use: No  . Sexual activity: Yes  Other Topics Concern  . Not on file  Social History Narrative  . Not on file   Social Determinants of Health   Financial Resource Strain:   . Difficulty of Paying Living Expenses: Not on file  Food Insecurity:   . Worried About Charity fundraiser in the Last Year: Not on file  . Ran Out of Food in the Last Year: Not on file  Transportation Needs:   . Lack of Transportation (Medical): Not on file  . Lack of Transportation (Non-Medical): Not on file  Physical Activity:   .  Days of Exercise per Week: Not on file  . Minutes of Exercise per Session: Not on file  Stress:   . Feeling of Stress : Not on file  Social Connections:   . Frequency of Communication with Friends and Family: Not on file  . Frequency of Social Gatherings with Friends and Family: Not on file  . Attends Religious Services: Not on file  . Active Member of Clubs or Organizations: Not on file  . Attends Archivist Meetings: Not on file  . Marital Status: Not on file  Intimate Partner Violence:   . Fear of Current or Ex-Partner: Not on file  . Emotionally Abused: Not on file  . Physically Abused: Not on file  . Sexually Abused: Not on file    Outpatient Medications Prior to Visit  Medication Sig Dispense Refill  . pantoprazole (PROTONIX) 40 MG tablet Take 1  tablet (40 mg total) by mouth 2 (two) times daily. (Patient taking differently: Take 40 mg by mouth 2 (two) times daily. Does not take everyday) 60 tablet 3  . [START ON 06/22/2020] sertraline (ZOLOFT) 100 MG tablet Take 1 tablet (100 mg total) by mouth daily. 30 tablet 1  . buPROPion (WELLBUTRIN SR) 150 MG 12 hr tablet Take 1 tablet (150 mg total) by mouth 2 (two) times daily. Start with 150 mg QD for 3 days and then increase to BID. (Patient not taking: Reported on 06/13/2020) 60 tablet 1  . nicotine (NICODERM CQ - DOSED IN MG/24 HOURS) 14 mg/24hr patch Place 1 patch (14 mg total) onto the skin daily. 28 patch 1   No facility-administered medications prior to visit.    Allergies  Allergen Reactions  . Paroxetine Hcl     Erectile dysfunction    Review of Systems  Constitutional: Negative for chills and fever.  HENT: Negative for congestion, sinus pressure, sinus pain and sore throat.   Eyes: Negative for pain and discharge.  Respiratory: Negative for cough and shortness of breath.   Cardiovascular: Negative for chest pain and palpitations.  Gastrointestinal: Negative for constipation, diarrhea, nausea and vomiting.  Endocrine: Negative for polydipsia and polyuria.  Genitourinary: Negative for dysuria and hematuria.  Musculoskeletal: Positive for neck pain. Negative for neck stiffness.  Skin: Negative for rash.  Neurological: Positive for headaches. Negative for dizziness, weakness and numbness.  Psychiatric/Behavioral: Negative for agitation and behavioral problems.       Objective:    Physical Exam Vitals reviewed.  Constitutional:      General: He is not in acute distress.    Appearance: He is not diaphoretic.  HENT:     Head: Normocephalic and atraumatic.     Nose: Nose normal.     Mouth/Throat:     Mouth: Mucous membranes are moist.  Eyes:     General: No scleral icterus.    Extraocular Movements: Extraocular movements intact.     Pupils: Pupils are equal, round, and  reactive to light.  Cardiovascular:     Rate and Rhythm: Normal rate and regular rhythm.     Pulses: Normal pulses.     Heart sounds: No murmur heard.   Pulmonary:     Breath sounds: Normal breath sounds. No wheezing or rales.  Abdominal:     Palpations: Abdomen is soft.     Tenderness: There is no abdominal tenderness.  Musculoskeletal:     Cervical back: Neck supple. Tenderness (around trapezius ridge, right side) present. No rigidity.     Right lower leg: No  edema.     Left lower leg: No edema.  Skin:    General: Skin is warm.     Findings: No rash.  Neurological:     General: No focal deficit present.     Mental Status: He is alert and oriented to person, place, and time.     Cranial Nerves: No cranial nerve deficit.     Sensory: No sensory deficit.     Motor: No weakness.  Psychiatric:        Mood and Affect: Mood normal.        Behavior: Behavior normal.     BP 123/84 (BP Location: Right Arm, Patient Position: Sitting, Cuff Size: Normal)   Pulse 95   Temp 97.8 F (36.6 C) (Temporal)   Resp 18   Ht 6' (1.829 m)   Wt 191 lb (86.6 kg)   SpO2 97%   BMI 25.90 kg/m  Wt Readings from Last 3 Encounters:  06/13/20 191 lb (86.6 kg)  05/25/20 188 lb (85.3 kg)  04/20/20 191 lb 6.4 oz (86.8 kg)    Health Maintenance Due  Topic Date Due  . Hepatitis C Screening  Never done  . TETANUS/TDAP  Never done    There are no preventive care reminders to display for this patient.   Lab Results  Component Value Date   TSH 1.02 12/03/2018   Lab Results  Component Value Date   WBC 9.4 12/03/2018   HGB 17.7 (H) 12/03/2018   HCT 52.0 (H) 12/03/2018   MCV 90.4 12/03/2018   PLT 248 12/03/2018   Lab Results  Component Value Date   NA 139 12/03/2018   K 5.2 12/03/2018   CO2 22 12/03/2018   GLUCOSE 88 12/03/2018   BUN 6 (L) 12/03/2018   CREATININE 0.90 12/03/2018   BILITOT 0.9 12/03/2018   ALKPHOS 54 03/26/2016   AST 21 12/03/2018   ALT 21 12/03/2018   PROT 7.3  12/03/2018   ALBUMIN 4.8 03/26/2016   CALCIUM 10.2 12/03/2018   Lab Results  Component Value Date   CHOL 137 03/26/2016   Lab Results  Component Value Date   HDL 61 03/26/2016   Lab Results  Component Value Date   LDLCALC 54 03/26/2016   Lab Results  Component Value Date   TRIG 111 03/26/2016   Lab Results  Component Value Date   CHOLHDL 2.2 03/26/2016   Lab Results  Component Value Date   HGBA1C 5.2 12/03/2018       Assessment & Plan:   Cervical pain (neck) Has been noticing swelling and stiffness over left side of neck, hairline area Sees a Chiropractor for manipulation Advised simple neck exercises, material provided Tylenol PRN, advised to avoid Ibuprofen due to GERD  GERD (gastroesophageal reflux disease) Persistent symptoms, although has not been taking Pantoprazole twice daily Advised to stay compliant to twice daily dosing of Pantoprazole If persistent symptoms, will schedule H Pylori testing and/or referral to GI for EGD Discussed lifestyle modification (I.e., avoid spicy food, late meals before bedtime)  Occipital headache No alarming signs currently Referred to Neurology for evaluation of headache and neuropathy  Peripheral neuropathy vs lateral cutaneous femoral nerve dysfunction Numbness in the lateral side of leg, resolves spontaneously No recent injury Neurology referral Might need imaging if symptoms persist   Tobacco use disorder Has been trying to cut down Not taking Bupropion as he was feeling slow, fatigued        Other Visit Diagnoses    Idiopathic peripheral neuropathy  Occipital headache       Relevant Orders   Ambulatory referral to Neurology       No orders of the defined types were placed in this encounter.    Christopher Spar, MD

## 2020-06-13 NOTE — Assessment & Plan Note (Signed)
Has been noticing swelling and stiffness over left side of neck, hairline area Sees a Chiropractor for manipulation Advised simple neck exercises, material provided Tylenol PRN, advised to avoid Ibuprofen due to GERD

## 2020-06-13 NOTE — Patient Instructions (Addendum)
Please take Tylenol as needed for headache up to 4 times in a day.  Please quit smoking as soon as possible. Take Pantoprazole 40 mg twice daily for GERD. Please call us back if the symptoms persist after 6 weeks.  Please follow simple neck exercises as discussed. [email or web address]  You are being referred to Neurology for evaluation of peripheral neuropathy and headache.

## 2020-06-13 NOTE — Assessment & Plan Note (Signed)
Has been trying to cut down Not taking Bupropion as he was feeling slow, fatigued

## 2020-06-13 NOTE — Assessment & Plan Note (Addendum)
Persistent symptoms, although has not been taking Pantoprazole twice daily Advised to stay compliant to twice daily dosing of Pantoprazole If persistent symptoms, will schedule H Pylori testing and/or referral to GI for EGD Discussed lifestyle modification (I.e., avoid spicy food, late meals before bedtime)

## 2020-06-15 ENCOUNTER — Other Ambulatory Visit: Payer: Self-pay | Admitting: Family Medicine

## 2020-06-27 ENCOUNTER — Ambulatory Visit: Payer: Medicaid Other | Admitting: Neurology

## 2020-06-27 ENCOUNTER — Encounter: Payer: Self-pay | Admitting: Neurology

## 2020-06-27 VITALS — BP 118/72 | HR 88 | Ht 72.0 in | Wt 190.5 lb

## 2020-06-27 DIAGNOSIS — M542 Cervicalgia: Secondary | ICD-10-CM | POA: Diagnosis not present

## 2020-06-27 MED ORDER — MELOXICAM 7.5 MG PO TABS: 7.5000 mg | ORAL_TABLET | Freq: Every day | ORAL | 1 refills | Status: DC | PRN

## 2020-06-27 NOTE — Progress Notes (Signed)
Chief Complaint  Patient presents with  . New Patient (Initial Visit)    Reports right-sided pain in the back of his head at the top of his neck, along with intermittent tingling.   Marland Kitchen PCP    Lindell Spar, MD    HISTORICAL  Christopher Ahr. is a 44 year old male, seen in request by his primary care physician Dr. Posey Pronto, [place] for evaluation of right neck discomfort, initial evaluation was on June 28, 2020.  I reviewed and summarized the referring note.  Past medical history Obesesive compulsive disorder.  He complains of right neck, occipital region discomfort stiffness, string-like sensation since September 2021, no similar sensation on the left side of his neck, occasionally right lateral leg numbness,  He has used chiropractor intermittently over the past 6 years, which has helped his low back pain, neck pain,  He denies radiating pain to his right shoulder, right arm, he denies gait abnormality, denies right arm weakness   REVIEW OF SYSTEMS: Full 14 system review of systems performed and notable only for as above All other review of systems were negative.  ALLERGIES: Allergies  Allergen Reactions  . Paroxetine Hcl     Erectile dysfunction    HOME MEDICATIONS: Current Outpatient Medications  Medication Sig Dispense Refill  . pantoprazole (PROTONIX) 40 MG tablet TAKE 1 TABLET(40 MG) BY MOUTH TWICE DAILY 60 tablet 3  . sertraline (ZOLOFT) 100 MG tablet Take 1 tablet (100 mg total) by mouth daily. 30 tablet 1   No current facility-administered medications for this visit.    PAST MEDICAL HISTORY: Past Medical History:  Diagnosis Date  . Anxiety    Chronic  . GERD (gastroesophageal reflux disease)   . Headache   . OCD (obsessive compulsive disorder)     PAST SURGICAL HISTORY: Past Surgical History:  Procedure Laterality Date  . WISDOM TOOTH EXTRACTION      FAMILY HISTORY: Family History  Problem Relation Age of Onset  . Depression Mother   .  Anxiety disorder Mother   . Depression Sister   . Bipolar disorder Paternal Uncle   . Schizophrenia Paternal Uncle   . Alcohol abuse Maternal Uncle   . Hypertension Father   . Colon cancer Neg Hx     SOCIAL HISTORY: Social History   Socioeconomic History  . Marital status: Significant Other    Spouse name: Not on file  . Number of children: 1  . Years of education: 25  . Highest education level: High school graduate  Occupational History  . Occupation: local truck driver  Tobacco Use  . Smoking status: Current Every Day Smoker    Packs/day: 0.50    Types: Cigarettes  . Smokeless tobacco: Former Systems developer    Types: Snuff  Vaping Use  . Vaping Use: Never used  Substance and Sexual Activity  . Alcohol use: Yes    Comment: 1-2 glasses of wine daily  . Drug use: No  . Sexual activity: Yes  Other Topics Concern  . Not on file  Social History Narrative   Lives at home, along with his son and parents.   Left-sided.   1-3 cup coffee, occasional soda.   Social Determinants of Health   Financial Resource Strain:   . Difficulty of Paying Living Expenses: Not on file  Food Insecurity:   . Worried About Charity fundraiser in the Last Year: Not on file  . Ran Out of Food in the Last Year: Not on file  Transportation  Needs:   . Lack of Transportation (Medical): Not on file  . Lack of Transportation (Non-Medical): Not on file  Physical Activity:   . Days of Exercise per Week: Not on file  . Minutes of Exercise per Session: Not on file  Stress:   . Feeling of Stress : Not on file  Social Connections:   . Frequency of Communication with Friends and Family: Not on file  . Frequency of Social Gatherings with Friends and Family: Not on file  . Attends Religious Services: Not on file  . Active Member of Clubs or Organizations: Not on file  . Attends Archivist Meetings: Not on file  . Marital Status: Not on file  Intimate Partner Violence:   . Fear of Current or  Ex-Partner: Not on file  . Emotionally Abused: Not on file  . Physically Abused: Not on file  . Sexually Abused: Not on file     PHYSICAL EXAM   Vitals:   06/27/20 1515  Weight: 190 lb 8 oz (86.4 kg)  Height: 6' (1.829 m)   Not recorded     Body mass index is 25.84 kg/m.  PHYSICAL EXAMNIATION:  Gen: NAD, conversant, well nourised, well groomed                     Cardiovascular: Regular rate rhythm, no peripheral edema, warm, nontender. Eyes: Conjunctivae clear without exudates or hemorrhage Neck: Supple, no carotid bruits. Pulmonary: Clear to auscultation bilaterally   NEUROLOGICAL EXAM:  MENTAL STATUS: Speech:    Speech is normal; fluent and spontaneous with normal comprehension.  Cognition:     Orientation to time, place and person     Normal recent and remote memory     Normal Attention span and concentration     Normal Language, naming, repeating,spontaneous speech     Fund of knowledge   CRANIAL NERVES: CN II: Visual fields are full to confrontation. Pupils are round equal and briskly reactive to light. CN III, IV, VI: extraocular movement are normal. No ptosis. CN V: Facial sensation is intact to light touch CN VII: Face is symmetric with normal eye closure  CN VIII: Hearing is normal to causal conversation. CN IX, X: Phonation is normal. CN XI: Head turning and shoulder shrug are intact  MOTOR: There is no pronator drift of out-stretched arms. Muscle bulk and tone are normal. Muscle strength is normal.  REFLEXES: Reflexes are 2+ and symmetric at the biceps, triceps, knees, and ankles. Plantar responses are flexor.  SENSORY: Intact to light touch, pinprick and vibratory sensation are intact in fingers and toes.  COORDINATION: There is no trunk or limb dysmetria noted.  GAIT/STANCE: Posture is normal. Gait is steady with normal steps, base, arm swing, and turning. Heel and toe walking are normal. Tandem gait is normal.  Romberg is  absent.   DIAGNOSTIC DATA (LABS, IMAGING, TESTING) - I reviewed patient records, labs, notes, testing and imaging myself where available.   ASSESSMENT AND PLAN  Christopher Wyatt. is a 44 y.o. male   Right posterior neck discomfort  Most likely due to musculoskeletal etiology  He desires further evaluation, will proceed with MRI of cervical spine,  Also suggested him neck stretching exercise, heating pad, as needed NSAIDs,  Mobic 7.5 mg as needed   Marcial Pacas, M.D. Ph.D.  [facility] [address], [place], [place] [address] Ph: [phone] Fax: [phone]  CC:  Lindell Spar, MD [phone]  [place],  [place] [address]

## 2020-06-28 ENCOUNTER — Telehealth: Payer: Self-pay | Admitting: Neurology

## 2020-06-28 NOTE — Telephone Encounter (Signed)
mcd healthy blue pending

## 2020-07-03 NOTE — Telephone Encounter (Signed)
I checked the status on the portal and it is still pending.

## 2020-07-11 NOTE — Telephone Encounter (Signed)
mcd healthy blue Josem Kaufmann: [id number] (exp. 06/28/20 to 08/26/20) order sent to GI. They will reach out to the patient to schedule.

## 2020-07-12 ENCOUNTER — Other Ambulatory Visit: Payer: Self-pay

## 2020-07-12 ENCOUNTER — Ambulatory Visit
Admission: RE | Admit: 2020-07-12 | Discharge: 2020-07-12 | Disposition: A | Discharge status: Home / Self Care | Payer: Medicaid Other | Source: Ambulatory Visit | Attending: Neurology | Admitting: Neurology

## 2020-07-12 DIAGNOSIS — M542 Cervicalgia: Secondary | ICD-10-CM | POA: Diagnosis not present

## 2020-07-19 ENCOUNTER — Other Ambulatory Visit: Payer: Medicaid Other

## 2020-07-24 NOTE — Progress Notes (Deleted)
[facility] MD/PA/NP OP Progress Note  07/24/2020 1:40 PM Christopher A Dimmitt Jr.  MRN:  [id number]  Chief Complaint:  HPI: *** Visit Diagnosis: No diagnosis found.  Past Psychiatric History: Please see initial evaluation for full details. I have reviewed the history. No updates at this time.     Past Medical History:  Past Medical History:  Diagnosis Date  . Anxiety    Chronic  . GERD (gastroesophageal reflux disease)   . Headache   . OCD (obsessive compulsive disorder)     Past Surgical History:  Procedure Laterality Date  . WISDOM TOOTH EXTRACTION      Family Psychiatric History: Please see initial evaluation for full details. I have reviewed the history. No updates at this time.     Family History:  Family History  Problem Relation Age of Onset  . Depression Mother   . Anxiety disorder Mother   . Depression Sister   . Bipolar disorder Paternal Uncle   . Schizophrenia Paternal Uncle   . Alcohol abuse Maternal Uncle   . Hypertension Father   . Colon cancer Neg Hx     Social History:  Social History   Socioeconomic History  . Marital status: Significant Other    Spouse name: Not on file  . Number of children: 1  . Years of education: 33  . Highest education level: High school graduate  Occupational History  . Occupation: local truck driver  Tobacco Use  . Smoking status: Current Every Day Smoker    Packs/day: 0.50    Types: Cigarettes  . Smokeless tobacco: Former Systems developer    Types: Snuff  Vaping Use  . Vaping Use: Never used  Substance and Sexual Activity  . Alcohol use: Yes    Comment: 1-2 glasses of wine daily  . Drug use: No  . Sexual activity: Yes  Other Topics Concern  . Not on file  Social History Narrative   Lives at home, along with his son and parents.   Left-sided.   1-3 cup coffee, occasional soda.   Social Determinants of Health   Financial Resource Strain:   . Difficulty of Paying Living Expenses: Not on file  Food Insecurity:   . Worried  About Charity fundraiser in the Last Year: Not on file  . Ran Out of Food in the Last Year: Not on file  Transportation Needs:   . Lack of Transportation (Medical): Not on file  . Lack of Transportation (Non-Medical): Not on file  Physical Activity:   . Days of Exercise per Week: Not on file  . Minutes of Exercise per Session: Not on file  Stress:   . Feeling of Stress : Not on file  Social Connections:   . Frequency of Communication with Friends and Family: Not on file  . Frequency of Social Gatherings with Friends and Family: Not on file  . Attends Religious Services: Not on file  . Active Member of Clubs or Organizations: Not on file  . Attends Archivist Meetings: Not on file  . Marital Status: Not on file    Allergies:  Allergies  Allergen Reactions  . Paroxetine Hcl     Erectile dysfunction    Metabolic Disorder Labs: Lab Results  Component Value Date   HGBA1C 5.2 12/03/2018   MPG 103 12/03/2018   No results found for: PROLACTIN Lab Results  Component Value Date   CHOL 137 03/26/2016   TRIG 111 03/26/2016   HDL 61 03/26/2016   CHOLHDL  2.2 03/26/2016   VLDL 22 03/26/2016   LDLCALC 54 03/26/2016   Lab Results  Component Value Date   TSH 1.02 12/03/2018   TSH 1.39 03/26/2016    Therapeutic Level Labs: No results found for: LITHIUM No results found for: VALPROATE No components found for:  CBMZ  Current Medications: Current Outpatient Medications  Medication Sig Dispense Refill  . meloxicam (MOBIC) 7.5 MG tablet Take 1 tablet (7.5 mg total) by mouth daily as needed for pain. 30 tablet 1  . pantoprazole (PROTONIX) 40 MG tablet TAKE 1 TABLET(40 MG) BY MOUTH TWICE DAILY 60 tablet 3  . sertraline (ZOLOFT) 100 MG tablet Take 1 tablet (100 mg total) by mouth daily. 30 tablet 1   No current facility-administered medications for this visit.     Musculoskeletal: Strength & Muscle Tone: N/A Gait & Station: N/A Patient leans: N/A  Psychiatric  Specialty Exam: Review of Systems  There were no vitals taken for this visit.There is no height or weight on file to calculate BMI.  General Appearance: {Appearance:22683}  Eye Contact:  {BHH EYE CONTACT:22684}  Speech:  Clear and Coherent  Volume:  Normal  Mood:  {BHH MOOD:22306}  Affect:  {Affect (PAA):22687}  Thought Process:  Coherent  Orientation:  Full (Time, Place, and Person)  Thought Content: Logical   Suicidal Thoughts:  {ST/HT (PAA):22692}  Homicidal Thoughts:  {ST/HT (PAA):22692}  Memory:  Immediate;   Good  Judgement:  {Judgement (PAA):22694}  Insight:  {Insight (PAA):22695}  Psychomotor Activity:  Normal  Concentration:  Concentration: Good and Attention Span: Good  Recall:  Good  Fund of Knowledge: Good  Language: Good  Akathisia:  No  Handed:  Right  AIMS (if indicated): not done  Assets:  Communication Skills Desire for Improvement  ADL's:  Intact  Cognition: WNL  Sleep:  {BHH GOOD/FAIR/POOR:22877}   Screenings: PHQ2-9     Office Visit from 06/13/2020 in [facility] Video Visit from 05/25/2020 in [facility] Office Visit from 04/20/2020 in [facility] Office Visit from 04/06/2020 in [facility] Office Visit from 06/11/2018 in [facility]  PHQ-2 Total Score 1 0 0 1 0  PHQ-9 Total Score 3 -- -- 3 --       Assessment and Plan:  Christopher Wyatt. is a 44 y.o. year old male with a history of  OCD, anxiety, who presents for follow up appointment for below.    1. Mixed obsessional thoughts and acts 2. Anxiety Although he continues to have OCD symptoms and anxiety, there has been overall improvement in his symptoms.  Although he will benefit from further up titration of sertraline, will not do this at this time given his new GI symptoms today, which is more attributable to his physical condition.  We will continue current dose of sertraline to target OCD and anxiety.  Will consider switching to  another antidepressant if any worsening in GI symptoms.   Plan 1.Continuesertraline100mg  daily - monitor loose stools 2.Next appointment:11/22 at 11 AM for 30 mins, video  Past trials of medication:Paxil, citalopram,lexapro,fluoxetine,venlafaxine(drowsiness),clomipramine,bupropion, Xanax (memory loss), clonazepam (drowsy)  The patient demonstrates the following risk factors for suicide: Chronic risk factors for suicide include:psychiatric disorder ofanxiety. Acute risk factorsfor suicide include: N/A. Protective factorsfor this patient include: positive social support, responsibility to others (children, family), coping skills and hope for the future. Considering these factors, the overall suicide risk at this point appears to below. Patientisappropriate for outpatient follow up.   Norman Clay, MD  07/24/2020, 1:40 PM

## 2020-07-25 ENCOUNTER — Other Ambulatory Visit: Payer: Self-pay | Admitting: Internal Medicine

## 2020-07-25 ENCOUNTER — Ambulatory Visit: Payer: Medicaid Other | Admitting: Internal Medicine

## 2020-07-25 ENCOUNTER — Telehealth: Payer: Self-pay | Admitting: Neurology

## 2020-07-25 DIAGNOSIS — Z716 Tobacco abuse counseling: Secondary | ICD-10-CM

## 2020-07-25 NOTE — Telephone Encounter (Signed)
Dr. Krista Blue sent pt mychart message about results on 07/13/20: "There was no significant abnormalities on MRI of cervical spine.". Pt has seen results as of this morning on mychart.

## 2020-07-25 NOTE — Telephone Encounter (Signed)
Pt called to check on status of MRI results.

## 2020-07-25 NOTE — Telephone Encounter (Signed)
I called pt. He confirmed he saw Dr. Rhea Belton message via Deloris Ping and has no further questions.

## 2020-07-31 ENCOUNTER — Telehealth (HOSPITAL_COMMUNITY): Payer: Medicaid Other | Admitting: Psychiatry

## 2020-07-31 ENCOUNTER — Telehealth: Payer: Medicaid Other | Admitting: Psychiatry

## 2020-07-31 ENCOUNTER — Telehealth: Payer: Self-pay | Admitting: Psychiatry

## 2020-07-31 ENCOUNTER — Other Ambulatory Visit: Payer: Self-pay

## 2020-07-31 NOTE — Telephone Encounter (Signed)
Sent link for video visit through Epic. Patient did not sign in. Called the patient  for appointment scheduled today. The patient did not answer the phone. Left voice message to contact the office.  

## 2020-08-01 ENCOUNTER — Encounter: Payer: Self-pay | Admitting: Nurse Practitioner

## 2020-08-01 ENCOUNTER — Ambulatory Visit (INDEPENDENT_AMBULATORY_CARE_PROVIDER_SITE_OTHER): Payer: Medicaid Other | Admitting: Nurse Practitioner

## 2020-08-01 VITALS — BP 117/83 | HR 109 | Temp 97.0°F | Ht 72.0 in | Wt 190.0 lb

## 2020-08-01 DIAGNOSIS — R197 Diarrhea, unspecified: Secondary | ICD-10-CM | POA: Diagnosis not present

## 2020-08-01 DIAGNOSIS — K219 Gastro-esophageal reflux disease without esophagitis: Secondary | ICD-10-CM | POA: Diagnosis not present

## 2020-08-01 MED ORDER — SUCRALFATE 1 G PO TABS: 1.0000 g | ORAL_TABLET | Freq: Four times a day (QID) | ORAL | 2 refills | Status: DC | PRN

## 2020-08-01 MED ORDER — DEXILANT 60 MG PO CPDR: 60.0000 mg | DELAYED_RELEASE_CAPSULE | Freq: Every day | ORAL | 3 refills | Status: DC

## 2020-08-01 NOTE — H&P (View-Only) (Signed)
Referring Provider: Lindell Spar, MD Primary Care Physician:  Lindell Spar, MD Primary GI:  Dr. Gala Romney  Chief Complaint  Patient presents with  . Gastroesophageal Reflux    f/u. ongoing severe flares    HPI:   Christopher Wyatt. is a 44 y.o. male who presents for follow-up on GERD.  Patient was last seen in our office 12/09/2019 for the same.  History of chronic GERD failed OTC medications.  Previously tried omeprazole 40 mg which did not help.  H. pylori testing was negative.  He was switched to Protonix which seems to help when he "remembers to take it."  Notes he does not like to take medications and sometimes will forget. Wine causes significant symptoms.  GERD typically only at night with flares in the evening and identified triggers including spicy food, tomato sauce.  Has cut back on energy drinks.  No other overt GI complaints.  Recommended continue Protonix and increase to twice a day as needed, make concerted effort to take medications, follow-up in 4 months.  Today he states doing okay overall. He has been having worsening GERD symptoms, has been taking Protonix bid. Has GERD symptoms about every day despite PPI. He had 18 TUMS yesterday evening. Last Saturday had 22 TUMS. Advised to limit to recommendations on the bottle. Has esophageal burning, stomach pain. Denies melena. Has some nausea and/or vomiting with this. Also wakes in the evening/night with regurgitation/water brash. Not on Mobic (meloxicam) any more. Denies NSAIDs and ASA powders. Had gravy biscuit this morning and having GERD currently. Also having some changes in his stools, used to be consistent with Bristol 4. Currently stools are loose consistent with Bristol 6-7. Started 2 months ago. Has one stool daily. Seems to have been since he started Zoloft. Zoloft has a known possible ADE of diarrhea (approximately 20%). Is trying to get scheduled with his psych provider to discuss. Recommended he discuss loose stools  with her. Denies any other abdominal pain, hematochezia, fever, chills, unintentional weight loss. Denies URI or flu-like symptoms. Denies loss of sense of taste or smell. The patient has not received COVID-19 vaccination(s). They are not interested in vaccine scheduling information. Denies chest pain, dyspnea, dizziness, lightheadedness, syncope, near syncope. Denies any other upper or lower GI symptoms.  Also had an episode of significant GERD recently and took 3/4 of a bottle of Pepto Bismol.  Past Medical History:  Diagnosis Date  . Anxiety    Chronic  . GERD (gastroesophageal reflux disease)   . Headache   . OCD (obsessive compulsive disorder)     Past Surgical History:  Procedure Laterality Date  . WISDOM TOOTH EXTRACTION      Current Outpatient Medications  Medication Sig Dispense Refill  . calcium carbonate (TUMS - DOSED IN MG ELEMENTAL CALCIUM) 500 MG chewable tablet Chew 1 tablet by mouth as needed for indigestion or heartburn.    . pantoprazole (PROTONIX) 40 MG tablet TAKE 1 TABLET(40 MG) BY MOUTH TWICE DAILY 60 tablet 3  . sertraline (ZOLOFT) 100 MG tablet Take 1 tablet (100 mg total) by mouth daily. 30 tablet 1   No current facility-administered medications for this visit.    Allergies as of 08/01/2020 - Review Complete 08/01/2020  Allergen Reaction Noted  . Paroxetine hcl  01/09/2011    Family History  Problem Relation Age of Onset  . Depression Mother   . Anxiety disorder Mother   . Depression Sister   . Bipolar disorder Paternal Uncle   .  Schizophrenia Paternal Uncle   . Alcohol abuse Maternal Uncle   . Hypertension Father   . Colon cancer Neg Hx     Social History   Socioeconomic History  . Marital status: Significant Other    Spouse name: Not on file  . Number of children: 1  . Years of education: 57  . Highest education level: High school graduate  Occupational History  . Occupation: local truck driver  Tobacco Use  . Smoking status: Current  Every Day Smoker    Packs/day: 0.50    Types: Cigarettes  . Smokeless tobacco: Former Systems developer    Types: Snuff  Vaping Use  . Vaping Use: Never used  Substance and Sexual Activity  . Alcohol use: Yes    Comment: 1-2 glasses of wine daily  . Drug use: No  . Sexual activity: Yes  Other Topics Concern  . Not on file  Social History Narrative   Lives at home, along with his son and parents.   Left-sided.   1-3 cup coffee, occasional soda.   Social Determinants of Health   Financial Resource Strain:   . Difficulty of Paying Living Expenses: Not on file  Food Insecurity:   . Worried About Charity fundraiser in the Last Year: Not on file  . Ran Out of Food in the Last Year: Not on file  Transportation Needs:   . Lack of Transportation (Medical): Not on file  . Lack of Transportation (Non-Medical): Not on file  Physical Activity:   . Days of Exercise per Week: Not on file  . Minutes of Exercise per Session: Not on file  Stress:   . Feeling of Stress : Not on file  Social Connections:   . Frequency of Communication with Friends and Family: Not on file  . Frequency of Social Gatherings with Friends and Family: Not on file  . Attends Religious Services: Not on file  . Active Member of Clubs or Organizations: Not on file  . Attends Archivist Meetings: Not on file  . Marital Status: Not on file    Subjective: Review of Systems  Constitutional: Negative for chills, fever, malaise/fatigue and weight loss.  HENT: Negative for congestion and sore throat.   Respiratory: Negative for cough and shortness of breath.   Cardiovascular: Negative for chest pain and palpitations.  Gastrointestinal: Positive for diarrhea and heartburn. Negative for abdominal pain, blood in stool, constipation, melena, nausea and vomiting.  Musculoskeletal: Negative for joint pain and myalgias.  Skin: Negative for rash.  Neurological: Negative for dizziness and weakness.  Endo/Heme/Allergies: Does  not bruise/bleed easily.  Psychiatric/Behavioral: Negative for depression. The patient is not nervous/anxious.   All other systems reviewed and are negative.    Objective: BP 117/83   Pulse (!) 109   Temp (!) 97 F (36.1 C)   Ht 6' (1.829 m)   Wt 190 lb (86.2 kg)   BMI 25.77 kg/m  Physical Exam Vitals and nursing note reviewed.  Constitutional:      General: He is not in acute distress.    Appearance: Normal appearance. He is normal weight. He is not ill-appearing, toxic-appearing or diaphoretic.  HENT:     Head: Normocephalic and atraumatic.     Nose: No congestion or rhinorrhea.  Eyes:     General: No scleral icterus. Cardiovascular:     Rate and Rhythm: Normal rate and regular rhythm.     Heart sounds: Normal heart sounds.  Pulmonary:     Effort:  Pulmonary effort is normal.     Breath sounds: Normal breath sounds.  Abdominal:     General: Bowel sounds are normal. There is no distension.     Palpations: Abdomen is soft. There is no hepatomegaly, splenomegaly or mass.     Tenderness: There is abdominal tenderness in the epigastric area and left upper quadrant. There is no guarding or rebound.     Hernia: No hernia is present.  Musculoskeletal:     Cervical back: Neck supple.  Skin:    General: Skin is warm and dry.     Coloration: Skin is not jaundiced.     Findings: No bruising or rash.  Neurological:     General: No focal deficit present.     Mental Status: He is alert and oriented to person, place, and time. Mental status is at baseline.  Psychiatric:        Mood and Affect: Mood normal.        Behavior: Behavior normal.        Thought Content: Thought content normal.      Assessment:  Very pleasant 44 year old male presents for follow-up on GERD.  Also discussing some recent new onset diarrhea.  Previously his GERD was not well managed but he was not taking his medication as recommended.  He is currently taking his medication as still having flares.   Recently started having more loose stools, baseline is Bristol 4.  I feels likely drug effect from Zoloft.  GERD: Has regular flares.  Takes massive doses of over-the-counter medications intermittently.  I advised him to please stop doing this.  He is taking up to 25-30 Tums at a time.  Has also drank two thirds of a bottle of Pepto-Bismol previously.  At this point he is failed omeprazole and, now, Protonix to twice a day.  I will trial him on Dexilant with samples and request a progress report.  I feel this point we should perform an EGD to further evaluate as well given persistent symptoms despite multiple PPIs tried and failed.  Also given Carafate to help with symptomatic management in the meantime.  Loose stools: Change in bowel movements as he previously was having Bristol 4 stools about every other day.  He started Zoloft fairly recently and feels that around that time he began having more loose stools.  No concern for overt infection given he is having 1 loose stool a day, no significant numbers and no watery stools.  Zoloft does have been known adverse effect of diarrhea in about 20% of patients.  He has an upcoming appointment with his psych provider I recommended discussing this with her to see if there are other possible options.  In the meantime he can try Imodium to help with symptomatic management.  Call us for any worsening or severe symptoms  Proceed with EGD on propofol/MAC with Dr. Gala Romney in near future: the risks, benefits, and alternatives have been discussed with the patient in detail. The patient states understanding and desires to proceed.  The patient is currently on Zoloft.  He also has a history of significant anxiety and panic attacks. The patient is not on any other anticoagulants, anxiolytics, chronic pain medications, antidepressants, antidiabetics, or iron supplements.  We will plan for the procedure on propofol/MAC to promote adequate sedation.  ASA  II   Plan: 1. Prescription for Dexilant 60 mg daily (unfortunately we do not have samples) 2. Request progress for 1 to 2 weeks 3. Carafate 4 times a day  as needed 4. Stop massive doses of over-the-counter medications 5. Discussed diarrhea with psych provider 6. Stop Protonix for now 7. EGD with propofol 8. Call for any worsening or severe symptoms 9. Otherwise follow-up in 3 months.    Thank you for allowing [place] to participate in the care of [employer].  Walden Field, DNP, AGNP-C Adult & Gerontological Nurse Practitioner [facility]   08/01/2020 10:36 AM   Disclaimer: This note was dictated with voice recognition software. Similar sounding words can inadvertently be transcribed and may not be corrected upon review.

## 2020-08-01 NOTE — Patient Instructions (Addendum)
Your health issues we discussed today were:   GERD (reflux/heartburn): 1. I am sorry your symptoms are now doing well! 2. Stop taking Protonix for now 3. Start taking Dexilant 60 mg once daily.  I sent a prescription to your pharmacy as we are currently out of samples 4. Call us in 1 to 2 weeks and let us know if this is helping your reflux symptoms 5. Stop taking "massive doses" of over-the-counter medications 6. I have sent in Carafate 1 g to your pharmacy.  You can crush this up and mix it with 2 to 3 tablespoons of water to make a slurry and drink it if you are having a flare of your symptoms 7. You can use appropriate doses of Tums as needed.  Please follow the recommendations on the bottle 8. We will schedule your upper endoscopy for you 9. Further recommendations were made after your upper endoscopy 10. Call us for any worsening or severe symptoms  Diarrhea: 1. As we discussed, based on the timing and the known adverse effects of Zoloft, I feel your diarrhea and stool changes are likely due to Zoloft 2. Do not stop taking your Zoloft abruptly without guidance from your psych provider 3. At your next psych appointment discussed with your provider about other possible options 4. In the meantime you can use Imodium as needed for problematic loose stools 5. Call us if you have any worsening or severe symptoms such as significant abdominal pain, worsening nausea/vomiting, fever, etc.  Overall I recommend:  1. Continue your other current medications 2. Return for follow-up in 3 months 3. Call us for any questions or concerns   ---------------------------------------------------------------  I am glad you have gotten your COVID-19 vaccination!  Even though you are fully vaccinated you should continue to follow CDC and state/local guidelines.  ---------------------------------------------------------------   At [facility] we value your feedback. You may receive a  survey about your visit today. Please share your experience as we strive to create trusting relationships with our patients to provide genuine, compassionate, quality care.  We appreciate your understanding and patience as we review any laboratory studies, imaging, and other diagnostic tests that are ordered as we care for you. Our office policy is 5 business days for review of these results, and any emergent or urgent results are addressed in a timely manner for your best interest. If you do not hear from our office in 1 week, please contact us.   We also encourage the use of MyChart, which contains your medical information for your review as well. If you are not enrolled in this feature, an access code is on this after visit summary for your convenience. Thank you for allowing [place] to be involved in your care.  It was great to see you today!  I hope you have a Happy Thanksgiving!!

## 2020-08-01 NOTE — Progress Notes (Signed)
Referring Provider: Lindell Spar, MD Primary Care Physician:  Lindell Spar, MD Primary GI:  Dr. Gala Romney  Chief Complaint  Patient presents with  . Gastroesophageal Reflux    f/u. ongoing severe flares    HPI:   Christopher Wyatt. is a 44 y.o. male who presents for follow-up on GERD.  Patient was last seen in our office 12/09/2019 for the same.  History of chronic GERD failed OTC medications.  Previously tried omeprazole 40 mg which did not help.  H. pylori testing was negative.  He was switched to Protonix which seems to help when he "remembers to take it."  Notes he does not like to take medications and sometimes will forget. Wine causes significant symptoms.  GERD typically only at night with flares in the evening and identified triggers including spicy food, tomato sauce.  Has cut back on energy drinks.  No other overt GI complaints.  Recommended continue Protonix and increase to twice a day as needed, make concerted effort to take medications, follow-up in 4 months.  Today he states doing okay overall. He has been having worsening GERD symptoms, has been taking Protonix bid. Has GERD symptoms about every day despite PPI. He had 18 TUMS yesterday evening. Last Saturday had 22 TUMS. Advised to limit to recommendations on the bottle. Has esophageal burning, stomach pain. Denies melena. Has some nausea and/or vomiting with this. Also wakes in the evening/night with regurgitation/water brash. Not on Mobic (meloxicam) any more. Denies NSAIDs and ASA powders. Had gravy biscuit this morning and having GERD currently. Also having some changes in his stools, used to be consistent with Bristol 4. Currently stools are loose consistent with Bristol 6-7. Started 2 months ago. Has one stool daily. Seems to have been since he started Zoloft. Zoloft has a known possible ADE of diarrhea (approximately 20%). Is trying to get scheduled with his psych provider to discuss. Recommended he discuss loose stools  with her. Denies any other abdominal pain, hematochezia, fever, chills, unintentional weight loss. Denies URI or flu-like symptoms. Denies loss of sense of taste or smell. The patient has not received COVID-19 vaccination(s). They are not interested in vaccine scheduling information. Denies chest pain, dyspnea, dizziness, lightheadedness, syncope, near syncope. Denies any other upper or lower GI symptoms.  Also had an episode of significant GERD recently and took 3/4 of a bottle of Pepto Bismol.  Past Medical History:  Diagnosis Date  . Anxiety    Chronic  . GERD (gastroesophageal reflux disease)   . Headache   . OCD (obsessive compulsive disorder)     Past Surgical History:  Procedure Laterality Date  . WISDOM TOOTH EXTRACTION      Current Outpatient Medications  Medication Sig Dispense Refill  . calcium carbonate (TUMS - DOSED IN MG ELEMENTAL CALCIUM) 500 MG chewable tablet Chew 1 tablet by mouth as needed for indigestion or heartburn.    . pantoprazole (PROTONIX) 40 MG tablet TAKE 1 TABLET(40 MG) BY MOUTH TWICE DAILY 60 tablet 3  . sertraline (ZOLOFT) 100 MG tablet Take 1 tablet (100 mg total) by mouth daily. 30 tablet 1   No current facility-administered medications for this visit.    Allergies as of 08/01/2020 - Review Complete 08/01/2020  Allergen Reaction Noted  . Paroxetine hcl  01/09/2011    Family History  Problem Relation Age of Onset  . Depression Mother   . Anxiety disorder Mother   . Depression Sister   . Bipolar disorder Paternal Uncle   .  Schizophrenia Paternal Uncle   . Alcohol abuse Maternal Uncle   . Hypertension Father   . Colon cancer Neg Hx     Social History   Socioeconomic History  . Marital status: Significant Other    Spouse name: Not on file  . Number of children: 1  . Years of education: 57  . Highest education level: High school graduate  Occupational History  . Occupation: local truck driver  Tobacco Use  . Smoking status: Current  Every Day Smoker    Packs/day: 0.50    Types: Cigarettes  . Smokeless tobacco: Former Systems developer    Types: Snuff  Vaping Use  . Vaping Use: Never used  Substance and Sexual Activity  . Alcohol use: Yes    Comment: 1-2 glasses of wine daily  . Drug use: No  . Sexual activity: Yes  Other Topics Concern  . Not on file  Social History Narrative   Lives at home, along with his son and parents.   Left-sided.   1-3 cup coffee, occasional soda.   Social Determinants of Health   Financial Resource Strain:   . Difficulty of Paying Living Expenses: Not on file  Food Insecurity:   . Worried About Charity fundraiser in the Last Year: Not on file  . Ran Out of Food in the Last Year: Not on file  Transportation Needs:   . Lack of Transportation (Medical): Not on file  . Lack of Transportation (Non-Medical): Not on file  Physical Activity:   . Days of Exercise per Week: Not on file  . Minutes of Exercise per Session: Not on file  Stress:   . Feeling of Stress : Not on file  Social Connections:   . Frequency of Communication with Friends and Family: Not on file  . Frequency of Social Gatherings with Friends and Family: Not on file  . Attends Religious Services: Not on file  . Active Member of Clubs or Organizations: Not on file  . Attends Archivist Meetings: Not on file  . Marital Status: Not on file    Subjective: Review of Systems  Constitutional: Negative for chills, fever, malaise/fatigue and weight loss.  HENT: Negative for congestion and sore throat.   Respiratory: Negative for cough and shortness of breath.   Cardiovascular: Negative for chest pain and palpitations.  Gastrointestinal: Positive for diarrhea and heartburn. Negative for abdominal pain, blood in stool, constipation, melena, nausea and vomiting.  Musculoskeletal: Negative for joint pain and myalgias.  Skin: Negative for rash.  Neurological: Negative for dizziness and weakness.  Endo/Heme/Allergies: Does  not bruise/bleed easily.  Psychiatric/Behavioral: Negative for depression. The patient is not nervous/anxious.   All other systems reviewed and are negative.    Objective: BP 117/83   Pulse (!) 109   Temp (!) 97 F (36.1 C)   Ht 6' (1.829 m)   Wt 190 lb (86.2 kg)   BMI 25.77 kg/m  Physical Exam Vitals and nursing note reviewed.  Constitutional:      General: He is not in acute distress.    Appearance: Normal appearance. He is normal weight. He is not ill-appearing, toxic-appearing or diaphoretic.  HENT:     Head: Normocephalic and atraumatic.     Nose: No congestion or rhinorrhea.  Eyes:     General: No scleral icterus. Cardiovascular:     Rate and Rhythm: Normal rate and regular rhythm.     Heart sounds: Normal heart sounds.  Pulmonary:     Effort:  Pulmonary effort is normal.     Breath sounds: Normal breath sounds.  Abdominal:     General: Bowel sounds are normal. There is no distension.     Palpations: Abdomen is soft. There is no hepatomegaly, splenomegaly or mass.     Tenderness: There is abdominal tenderness in the epigastric area and left upper quadrant. There is no guarding or rebound.     Hernia: No hernia is present.  Musculoskeletal:     Cervical back: Neck supple.  Skin:    General: Skin is warm and dry.     Coloration: Skin is not jaundiced.     Findings: No bruising or rash.  Neurological:     General: No focal deficit present.     Mental Status: He is alert and oriented to person, place, and time. Mental status is at baseline.  Psychiatric:        Mood and Affect: Mood normal.        Behavior: Behavior normal.        Thought Content: Thought content normal.      Assessment:  Very pleasant 44 year old male presents for follow-up on GERD.  Also discussing some recent new onset diarrhea.  Previously his GERD was not well managed but he was not taking his medication as recommended.  He is currently taking his medication as still having flares.   Recently started having more loose stools, baseline is Bristol 4.  I feels likely drug effect from Zoloft.  GERD: Has regular flares.  Takes massive doses of over-the-counter medications intermittently.  I advised him to please stop doing this.  He is taking up to 25-30 Tums at a time.  Has also drank two thirds of a bottle of Pepto-Bismol previously.  At this point he is failed omeprazole and, now, Protonix to twice a day.  I will trial him on Dexilant with samples and request a progress report.  I feel this point we should perform an EGD to further evaluate as well given persistent symptoms despite multiple PPIs tried and failed.  Also given Carafate to help with symptomatic management in the meantime.  Loose stools: Change in bowel movements as he previously was having Bristol 4 stools about every other day.  He started Zoloft fairly recently and feels that around that time he began having more loose stools.  No concern for overt infection given he is having 1 loose stool a day, no significant numbers and no watery stools.  Zoloft does have been known adverse effect of diarrhea in about 20% of patients.  He has an upcoming appointment with his psych provider I recommended discussing this with her to see if there are other possible options.  In the meantime he can try Imodium to help with symptomatic management.  Call us for any worsening or severe symptoms  Proceed with EGD on propofol/MAC with Dr. Gala Romney in near future: the risks, benefits, and alternatives have been discussed with the patient in detail. The patient states understanding and desires to proceed.  The patient is currently on Zoloft.  He also has a history of significant anxiety and panic attacks. The patient is not on any other anticoagulants, anxiolytics, chronic pain medications, antidepressants, antidiabetics, or iron supplements.  We will plan for the procedure on propofol/MAC to promote adequate sedation.  ASA  II   Plan: 1. Prescription for Dexilant 60 mg daily (unfortunately we do not have samples) 2. Request progress for 1 to 2 weeks 3. Carafate 4 times a day  as needed 4. Stop massive doses of over-the-counter medications 5. Discussed diarrhea with psych provider 6. Stop Protonix for now 7. EGD with propofol 8. Call for any worsening or severe symptoms 9. Otherwise follow-up in 3 months.    Thank you for allowing [place] to participate in the care of [employer].  Walden Field, DNP, AGNP-C Adult & Gerontological Nurse Practitioner [facility]   08/01/2020 10:36 AM   Disclaimer: This note was dictated with voice recognition software. Similar sounding words can inadvertently be transcribed and may not be corrected upon review.

## 2020-08-02 ENCOUNTER — Telehealth: Payer: Self-pay

## 2020-08-02 NOTE — Telephone Encounter (Signed)
PA for Dexilant was submitted via [email or web address]. PA was approval. Approval letter will be scanned in pts chart. Spoke with pt. Pt is aware of approval.

## 2020-08-08 ENCOUNTER — Other Ambulatory Visit: Payer: Self-pay

## 2020-08-08 ENCOUNTER — Other Ambulatory Visit (HOSPITAL_COMMUNITY)
Admission: RE | Admit: 2020-08-08 | Discharge: 2020-08-08 | Disposition: A | Discharge status: Home / Self Care | Payer: Medicaid Other | Source: Ambulatory Visit | Attending: Internal Medicine | Admitting: Internal Medicine

## 2020-08-08 ENCOUNTER — Encounter (HOSPITAL_COMMUNITY): Payer: Self-pay | Admitting: Internal Medicine

## 2020-08-08 DIAGNOSIS — Z01812 Encounter for preprocedural laboratory examination: Secondary | ICD-10-CM | POA: Insufficient documentation

## 2020-08-08 DIAGNOSIS — Z20822 Contact with and (suspected) exposure to covid-19: Secondary | ICD-10-CM | POA: Diagnosis not present

## 2020-08-08 LAB — SARS CORONAVIRUS 2 (TAT 6-24 HRS): SARS Coronavirus 2: NEGATIVE

## 2020-08-10 ENCOUNTER — Encounter (HOSPITAL_COMMUNITY)
Admission: RE | Disposition: A | Discharge status: Home / Self Care | Payer: Self-pay | Source: Home / Self Care | Attending: Internal Medicine

## 2020-08-10 ENCOUNTER — Other Ambulatory Visit: Payer: Self-pay

## 2020-08-10 ENCOUNTER — Ambulatory Visit (HOSPITAL_COMMUNITY)
Admission: RE | Admit: 2020-08-10 | Discharge: 2020-08-10 | Disposition: A | Discharge status: Home / Self Care | Payer: Medicaid Other | Attending: Internal Medicine | Admitting: Internal Medicine

## 2020-08-10 ENCOUNTER — Encounter (HOSPITAL_COMMUNITY): Payer: Self-pay | Admitting: Internal Medicine

## 2020-08-10 ENCOUNTER — Ambulatory Visit (HOSPITAL_COMMUNITY): Payer: Medicaid Other | Admitting: Anesthesiology

## 2020-08-10 DIAGNOSIS — K209 Esophagitis, unspecified without bleeding: Secondary | ICD-10-CM | POA: Diagnosis not present

## 2020-08-10 DIAGNOSIS — K2100 Gastro-esophageal reflux disease with esophagitis, without bleeding: Secondary | ICD-10-CM | POA: Insufficient documentation

## 2020-08-10 DIAGNOSIS — Z79899 Other long term (current) drug therapy: Secondary | ICD-10-CM | POA: Insufficient documentation

## 2020-08-10 DIAGNOSIS — K449 Diaphragmatic hernia without obstruction or gangrene: Secondary | ICD-10-CM | POA: Diagnosis not present

## 2020-08-10 DIAGNOSIS — R1310 Dysphagia, unspecified: Secondary | ICD-10-CM | POA: Insufficient documentation

## 2020-08-10 DIAGNOSIS — K2210 Ulcer of esophagus without bleeding: Secondary | ICD-10-CM | POA: Insufficient documentation

## 2020-08-10 HISTORY — PX: ESOPHAGOGASTRODUODENOSCOPY (EGD) WITH PROPOFOL: SHX5813

## 2020-08-10 SURGERY — ESOPHAGOGASTRODUODENOSCOPY (EGD) WITH PROPOFOL
Anesthesia: General

## 2020-08-10 MED ORDER — LACTATED RINGERS IV SOLN: INTRAVENOUS | Status: DC

## 2020-08-10 MED ORDER — LIDOCAINE VISCOUS HCL 2 % MT SOLN
OROMUCOSAL | Status: AC
  Administered 2020-08-10: 15 mL via OROMUCOSAL
  Filled 2020-08-10: qty 15

## 2020-08-10 MED ORDER — GLYCOPYRROLATE 0.2 MG/ML IJ SOLN
INTRAMUSCULAR | Status: AC
  Filled 2020-08-10: qty 1

## 2020-08-10 MED ORDER — PROPOFOL 500 MG/50ML IV EMUL
INTRAVENOUS | Status: DC | PRN
  Administered 2020-08-10: 150 ug/kg/min via INTRAVENOUS

## 2020-08-10 MED ORDER — PROPOFOL 10 MG/ML IV BOLUS
INTRAVENOUS | Status: DC | PRN
  Administered 2020-08-10 (×2): 50 mg via INTRAVENOUS
  Administered 2020-08-10: 100 mg via INTRAVENOUS

## 2020-08-10 MED ORDER — LIDOCAINE VISCOUS HCL 2 % MT SOLN: 15.0000 mL | Freq: Once | OROMUCOSAL | Status: AC

## 2020-08-10 NOTE — Op Note (Signed)
[name] Patient Name: Christopher Wyatt Procedure Date: 08/10/2020 1:28 PM MRN: [id number] Date of Birth: 10/05/1975 Attending MD: Norvel Richards , MD CSN: [id number] Age: 44 Admit Type: Outpatient Procedure:                Upper GI endoscopy Indications:              Dysphagia Providers:                Norvel Richards, MD, Jeanann Lewandowsky. Sharon Seller, RN,                            Raphael Gibney, Technician Referring MD:              Medicines:                Propofol per Anesthesia Complications:            No immediate complications. Estimated Blood Loss:     Estimated blood loss: none. Procedure:                Pre-Anesthesia Assessment:                           - Prior to the procedure, a History and Physical                            was performed, and patient medications and                            allergies were reviewed. The patient's tolerance of                            previous anesthesia was also reviewed. The risks                            and benefits of the procedure and the sedation                            options and risks were discussed with the patient.                            All questions were answered, and informed consent                            was obtained. Prior Anticoagulants: The patient has                            taken no previous anticoagulant or antiplatelet                            agents. ASA Grade Assessment: III - A patient with                            severe systemic disease. After reviewing the risks  and benefits, the patient was deemed in                            satisfactory condition to undergo the procedure.                           After obtaining informed consent, the endoscope was                            passed under direct vision. Throughout the                            procedure, the patient's blood pressure, pulse, and                            oxygen saturations  were monitored continuously. The                            [id number]-[id number] ([id number]) scope was introduced through the                            mouth, and advanced to the second part of duodenum.                            The upper GI endoscopy was accomplished without                            difficulty. The patient tolerated the procedure                            well. Scope In: 1:41:14 PM Scope Out: 1:45:56 PM Total Procedure Duration: 0 hours 4 minutes 42 seconds  Findings:      Severe circumferential eroded/ulcerated mucosa involving the distal one       half of tubular esophagus consistent with severe reflux esophagitis no       obvious nodularity or Barrett's epithelium seen      A medium-sized hiatal hernia was present.      The exam was otherwise without abnormality. D1 and D2 appeared normal Impression:               Severe ulcerative/erosive reflux esophagitis                           - Medium-sized hiatal hernia.                           - The examination was otherwise normal.                           - No specimens collected. Moderate Sedation:      Moderate (conscious) sedation was personally administered by an       anesthesia professional. The following parameters were monitored: oxygen       saturation, heart rate, blood pressure, respiratory rate, EKG, adequacy       of pulmonary ventilation, and response to care. Recommendation:           -  Patient has a contact number available for                            emergencies. The signs and symptoms of potential                            delayed complications were discussed with the                            patient. Return to normal activities tomorrow.                            Written discharge instructions were provided to the                            patient.                           - Advance diet as tolerated. Continue Dexilant 60                            mg daily.. Use Carafate tablets via a slurry?1  amp                            slurry before meals and nightly. Cut back on                            alcohol, tobacco and carbonated beverages would                            consider a repeat EGD next year. Would also                            consider antireflux surgery. He is to return                            follow-up in our office 3 months. Procedure Code(s):        --- Professional ---                           [phone], Esophagogastroduodenoscopy, flexible,                            transoral; diagnostic, including collection of                            specimen(s) by brushing or washing, when performed                            (separate procedure) Diagnosis Code(s):        --- Professional ---                           K20.90, Esophagitis, unspecified without bleeding  K44.9, Diaphragmatic hernia without obstruction or                            gangrene                           R13.10, Dysphagia, unspecified CPT copyright 2019 American Medical Association. All rights reserved. The codes documented in this report are preliminary and upon coder review may  be revised to meet current compliance requirements. [name]. Shanele Nissan, MD Norvel Richards, MD 08/10/2020 1:57:30 PM This report has been signed electronically. Number of Addenda: 0

## 2020-08-10 NOTE — Transfer of Care (Signed)
Immediate Anesthesia Transfer of Care Note  Patient: Christopher Wyatt.  Procedure(s) Performed: ESOPHAGOGASTRODUODENOSCOPY (EGD) WITH PROPOFOL (N/A )  Patient Location: Endoscopy Unit  Anesthesia Type:MAC and General  Level of Consciousness: awake and patient cooperative  Airway & Oxygen Therapy: Patient Spontanous Breathing  Post-op Assessment: Report given to RN and Post -op Vital signs reviewed and stable  Post vital signs: Reviewed and stable  Last Vitals:  Vitals Value Taken Time  BP    Temp    Pulse    Resp    SpO2      Last Pain:  Vitals:   08/10/20 1333  TempSrc:   PainSc: 0-No pain      Patients Stated Pain Goal: 5 ([id number])  Complications: No complications documented.

## 2020-08-10 NOTE — Interval H&P Note (Signed)
History and Physical Interval Note:  08/10/2020 1:37 PM  Christopher A Moffitt Jr.  has presented today for surgery, with the diagnosis of GERD.  The various methods of treatment have been discussed with the patient and family. After consideration of risks, benefits and other options for treatment, the patient has consented to  Procedure(s) with comments: ESOPHAGOGASTRODUODENOSCOPY (EGD) WITH PROPOFOL (N/A) - 1:45pm as a surgical intervention.  The patient's history has been reviewed, patient examined, no change in status, stable for surgery.  I have reviewed the patient's chart and labs.  Questions were answered to the patient's satisfaction.     Manus Rudd  Patient reports Dexilant has improved symptoms just after 1 week.  Denies dysphagia.  Worsening symptoms with wine consumption and carbonated beverages. Diagnostic EGD today per plan.  EGD Discharge instructions Please read the instructions outlined below and refer to this sheet in the next few weeks. These discharge instructions provide you with general information on caring for yourself after you leave the hospital. Your doctor may also give you specific instructions. While your treatment has been planned according to the most current medical practices available, unavoidable complications occasionally occur. If you have any problems or questions after discharge, please call your doctor. ACTIVITY You may resume your regular activity but move at a slower pace for the next 24 hours.  Take frequent rest periods for the next 24 hours.  Walking will help expel (get rid of) the air and reduce the bloated feeling in your abdomen.  No driving for 24 hours (because of the anesthesia (medicine) used during the test).  You may shower.  Do not sign any important legal documents or operate any machinery for 24 hours (because of the anesthesia used during the test).  NUTRITION Drink plenty of fluids.  You may resume your normal diet.  Begin with a  light meal and progress to your normal diet.  Avoid alcoholic beverages for 24 hours or as instructed by your caregiver.  MEDICATIONS You may resume your normal medications unless your caregiver tells you otherwise.  WHAT YOU CAN EXPECT TODAY You may experience abdominal discomfort such as a feeling of fullness or "gas" pains.  FOLLOW-UP Your doctor will discuss the results of your test with you.  SEEK IMMEDIATE MEDICAL ATTENTION IF ANY OF THE FOLLOWING OCCUR: Excessive nausea (feeling sick to your stomach) and/or vomiting.  Severe abdominal pain and distention (swelling).  Trouble swallowing.  Temperature over 101 F (37.8 C).  Rectal bleeding or vomiting of blood.

## 2020-08-10 NOTE — Anesthesia Preprocedure Evaluation (Signed)
Anesthesia Evaluation  Patient identified by MRN, date of birth, ID band Patient awake    Reviewed: Allergy & Precautions, H&P , NPO status , Patient's Chart, lab work & pertinent test results, reviewed documented beta blocker date and time   Airway Mallampati: II  TM Distance: >3 FB Neck ROM: full    Dental no notable dental hx. (+) Teeth Intact   Pulmonary neg pulmonary ROS, Current Smoker,    Pulmonary exam normal breath sounds clear to auscultation       Cardiovascular Exercise Tolerance: Good negative cardio ROS   Rhythm:regular Rate:Normal     Neuro/Psych  Headaches, PSYCHIATRIC DISORDERS Anxiety Depression    GI/Hepatic Neg liver ROS, GERD  Medicated,  Endo/Other  negative endocrine ROS  Renal/GU Renal disease  negative genitourinary   Musculoskeletal   Abdominal   Peds  Hematology negative hematology ROS (+)   Anesthesia Other Findings   Reproductive/Obstetrics negative OB ROS                             Anesthesia Physical Anesthesia Plan  ASA: II  Anesthesia Plan: General   Post-op Pain Management:    Induction:   PONV Risk Score and Plan: Propofol infusion  Airway Management Planned:   Additional Equipment:   Intra-op Plan:   Post-operative Plan:   Informed Consent: I have reviewed the patients History and Physical, chart, labs and discussed the procedure including the risks, benefits and alternatives for the proposed anesthesia with the patient or authorized representative who has indicated his/her understanding and acceptance.     Dental Advisory Given  Plan Discussed with: CRNA  Anesthesia Plan Comments:         Anesthesia Quick Evaluation

## 2020-08-10 NOTE — Discharge Instructions (Signed)
EGD Discharge instructions Please read the instructions outlined below and refer to this sheet in the next few weeks. These discharge instructions provide you with general information on caring for yourself after you leave the hospital. Your doctor may also give you specific instructions. While your treatment has been planned according to the most current medical practices available, unavoidable complications occasionally occur. If you have any problems or questions after discharge, please call your doctor. ACTIVITY  You may resume your regular activity but move at a slower pace for the next 24 hours.   Take frequent rest periods for the next 24 hours.   Walking will help expel (get rid of) the air and reduce the bloated feeling in your abdomen.   No driving for 24 hours (because of the anesthesia (medicine) used during the test).   You may shower.   Do not sign any important legal documents or operate any machinery for 24 hours (because of the anesthesia used during the test).  NUTRITION  Drink plenty of fluids.   You may resume your normal diet.   Begin with a light meal and progress to your normal diet.   Avoid alcoholic beverages for 24 hours or as instructed by your caregiver.  MEDICATIONS  You may resume your normal medications unless your caregiver tells you otherwise.  WHAT YOU CAN EXPECT TODAY  You may experience abdominal discomfort such as a feeling of fullness or "gas" pains.  FOLLOW-UP  Your doctor will discuss the results of your test with you.  SEEK IMMEDIATE MEDICAL ATTENTION IF ANY OF THE FOLLOWING OCCUR:  Excessive nausea (feeling sick to your stomach) and/or vomiting.   Severe abdominal pain and distention (swelling).   Trouble swallowing.   Temperature over 101 F (37.8 C).   Rectal bleeding or vomiting of blood.    You have severe esophagitis from acid reflux  Continue Dexilant 60 mg daily before breakfast  Take Carafate tablets and dissolve  each in 10 cc of water and take as a slurry before meals 3 times a day  Curtail your use of wine, beer and carbonated beverages  Information on antireflux measures provided  Office visit in 3 months        I would recommend a repeat EGD next year to reassess your esophagus  At patient request, I called Burnell Trott at [phone] -reviewed results and recommendations  No driving or operating machinery for 24 hours after completion of this procedure today      Gastroesophageal Reflux Disease, Adult Gastroesophageal reflux (GER) happens when acid from the stomach flows up into the tube that connects the mouth and the stomach (esophagus). Normally, food travels down the esophagus and stays in the stomach to be digested. With GER, food and stomach acid sometimes move back up into the esophagus. You may have a disease called gastroesophageal reflux disease (GERD) if the reflux:  Happens often.  Causes frequent or very bad symptoms.  Causes problems such as damage to the esophagus. When this happens, the esophagus becomes sore and swollen (inflamed). Over time, GERD can make small holes (ulcers) in the lining of the esophagus. What are the causes? This condition is caused by a problem with the muscle between the esophagus and the stomach. When this muscle is weak or not normal, it does not close properly to keep food and acid from coming back up from the stomach. The muscle can be weak because of:  Tobacco use.  Pregnancy.  Having a certain type of hernia (  hiatal hernia).  Alcohol use.  Certain foods and drinks, such as coffee, chocolate, onions, and peppermint. What increases the risk? You are more likely to develop this condition if you:  Are overweight.  Have a disease that affects your connective tissue.  Use NSAID medicines. What are the signs or symptoms? Symptoms of this condition include:  Heartburn.  Difficult or painful swallowing.  The feeling of  having a lump in the throat.  A bitter taste in the mouth.  Bad breath.  Having a lot of saliva.  Having an upset or bloated stomach.  Belching.  Chest pain. Different conditions can cause chest pain. Make sure you see your doctor if you have chest pain.  Shortness of breath or noisy breathing (wheezing).  Ongoing (chronic) cough or a cough at night.  Wearing away of the surface of teeth (tooth enamel).  Weight loss. How is this treated? Treatment will depend on how bad your symptoms are. Your doctor may suggest:  Changes to your diet.  Medicine.  Surgery. Follow these instructions at home: Eating and drinking   Follow a diet as told by your doctor. You may need to avoid foods and drinks such as: ? Coffee and tea (with or without caffeine). ? Drinks that contain alcohol. ? Energy drinks and sports drinks. ? Bubbly (carbonated) drinks or sodas. ? Chocolate and cocoa. ? Peppermint and mint flavorings. ? Garlic and onions. ? Horseradish. ? Spicy and acidic foods. These include peppers, chili powder, curry powder, vinegar, hot sauces, and BBQ sauce. ? Citrus fruit juices and citrus fruits, such as oranges, lemons, and limes. ? Tomato-based foods. These include red sauce, chili, salsa, and pizza with red sauce. ? Fried and fatty foods. These include donuts, french fries, potato chips, and high-fat dressings. ? High-fat meats. These include hot dogs, rib eye steak, sausage, ham, and bacon. ? High-fat dairy items, such as whole milk, butter, and cream cheese.  Eat small meals often. Avoid eating large meals.  Avoid drinking large amounts of liquid with your meals.  Avoid eating meals during the 2-3 hours before bedtime.  Avoid lying down right after you eat.  Do not exercise right after you eat. Lifestyle   Do not use any products that contain nicotine or tobacco. These include cigarettes, e-cigarettes, and chewing tobacco. If you need help quitting, ask your  doctor.  Try to lower your stress. If you need help doing this, ask your doctor.  If you are overweight, lose an amount of weight that is healthy for you. Ask your doctor about a safe weight loss goal. General instructions  Pay attention to any changes in your symptoms.  Take over-the-counter and prescription medicines only as told by your doctor. Do not take aspirin, ibuprofen, or other NSAIDs unless your doctor says it is okay.  Wear loose clothes. Do not wear anything tight around your waist.  Raise (elevate) the head of your bed about 6 inches (15 cm).  Avoid bending over if this makes your symptoms worse.  Keep all follow-up visits as told by your doctor. This is important. Contact a doctor if:  You have new symptoms.  You lose weight and you do not know why.  You have trouble swallowing or it hurts to swallow.  You have wheezing or a cough that keeps happening.  Your symptoms do not get better with treatment.  You have a hoarse voice. Get help right away if:  You have pain in your arms, neck, jaw, teeth, or  back.  You feel sweaty, dizzy, or light-headed.  You have chest pain or shortness of breath.  You throw up (vomit) and your throw-up looks like blood or coffee grounds.  You pass out (faint).  Your poop (stool) is bloody or black.  You cannot swallow, drink, or eat. Summary  If a person has gastroesophageal reflux disease (GERD), food and stomach acid move back up into the esophagus and cause symptoms or problems such as damage to the esophagus.  Treatment will depend on how bad your symptoms are.  Follow a diet as told by your doctor.  Take all medicines only as told by your doctor. This information is not intended to replace advice given to you by your health care provider. Make sure you discuss any questions you have with your health care provider. Document Revised: 03/04/2018 Document Reviewed: 03/04/2018 Elsevier Patient Education  2020 [place] [employer] These instructions provide you with information about caring for yourself after your procedure. Your health care provider may also give you more specific instructions. Your treatment has been planned according to current medical practices, but problems sometimes occur. Call your health care provider if you have any problems or questions after your procedure. What can I expect after the procedure? After your procedure, you may:  Feel sleepy for several hours.  Feel clumsy and have poor balance for several hours.  Feel forgetful about what happened after the procedure.  Have poor judgment for several hours.  Feel nauseous or vomit.  Have a sore throat if you had a breathing tube during the procedure. Follow these instructions at home: For at least 24 hours after the procedure:      Have a responsible adult stay with you. It is important to have someone help care for you until you are awake and alert.  Rest as needed.  Do not: ? Participate in activities in which you could fall or become injured. ? Drive. ? Use heavy machinery. ? Drink alcohol. ? Take sleeping pills or medicines that cause drowsiness. ? Make important decisions or sign legal documents. ? Take care of children on your own. Eating and drinking  Follow the diet that is recommended by your health care provider.  If you vomit, drink water, juice, or soup when you can drink without vomiting.  Make sure you have little or no nausea before eating solid foods. General instructions  Take over-the-counter and prescription medicines only as told by your health care provider.  If you have sleep apnea, surgery and certain medicines can increase your risk for breathing problems. Follow instructions from your health care provider about wearing your sleep device: ? Anytime you are sleeping, including during daytime naps. ? While taking prescription pain medicines,  sleeping medicines, or medicines that make you drowsy.  If you smoke, do not smoke without supervision.  Keep all follow-up visits as told by your health care provider. This is important. Contact a health care provider if:  You keep feeling nauseous or you keep vomiting.  You feel light-headed.  You develop a rash.  You have a fever. Get help right away if:  You have trouble breathing. Summary  For several hours after your procedure, you may feel sleepy and have poor judgment.  Have a responsible adult stay with you for at least 24 hours or until you are awake and alert. This information is not intended to replace advice given to you by your health care provider.  Make sure you discuss any questions you have with your health care provider. Document Revised: 11/24/2017 Document Reviewed: 12/17/2015 Elsevier Patient Education  [place].

## 2020-08-10 NOTE — Anesthesia Postprocedure Evaluation (Signed)
Anesthesia Post Note  Patient: Christopher Wyatt.  Procedure(s) Performed: ESOPHAGOGASTRODUODENOSCOPY (EGD) WITH PROPOFOL (N/A )  Patient location during evaluation: Phase II Anesthesia Type: General Level of consciousness: awake Pain management: pain level controlled Vital Signs Assessment: post-procedure vital signs reviewed and stable Respiratory status: spontaneous breathing Cardiovascular status: blood pressure returned to baseline Postop Assessment: no headache Anesthetic complications: no   No complications documented.   Last Vitals:  Vitals:   08/10/20 1259 08/10/20 1354  BP: (!) 122/93 104/82  Pulse:  94  Resp: (!) 23 18  Temp: 37.1 C 36.8 C  SpO2: 96% 95%    Last Pain:  Vitals:   08/10/20 1354  TempSrc: Oral  PainSc: 0-No pain                 Louann Sjogren

## 2020-08-11 ENCOUNTER — Telehealth (INDEPENDENT_AMBULATORY_CARE_PROVIDER_SITE_OTHER): Payer: Medicaid Other | Admitting: Internal Medicine

## 2020-08-11 ENCOUNTER — Encounter: Payer: Self-pay | Admitting: Internal Medicine

## 2020-08-11 DIAGNOSIS — G5762 Lesion of plantar nerve, left lower limb: Secondary | ICD-10-CM | POA: Diagnosis not present

## 2020-08-11 DIAGNOSIS — F17200 Nicotine dependence, unspecified, uncomplicated: Secondary | ICD-10-CM

## 2020-08-11 DIAGNOSIS — K2210 Ulcer of esophagus without bleeding: Secondary | ICD-10-CM | POA: Insufficient documentation

## 2020-08-11 DIAGNOSIS — K209 Esophagitis, unspecified without bleeding: Secondary | ICD-10-CM | POA: Diagnosis not present

## 2020-08-11 DIAGNOSIS — F418 Other specified anxiety disorders: Secondary | ICD-10-CM | POA: Diagnosis not present

## 2020-08-11 MED ORDER — NICOTINE POLACRILEX 2 MG MT GUM: 2.0000 mg | CHEWING_GUM | OROMUCOSAL | Status: DC | PRN

## 2020-08-11 NOTE — Assessment & Plan Note (Signed)
GERD with esophagitis and hiatal hernia On Dexilant Recently had EGD, advised to continue to follow-up with GI.

## 2020-08-11 NOTE — Assessment & Plan Note (Signed)
Was evaluated by podiatrist in the past.  Due to insurance concerns, requests new referral for podiatrist. Podiatry referral provided.

## 2020-08-11 NOTE — Assessment & Plan Note (Signed)
Asked about quitting: confirms that he currently smokes cigarettes Advise to quit smoking: Educated about QUITTING to reduce the risk of cancer, cardio and cerebrovascular disease. Assess willingness: Unwilling to quit at this time, but is working on cutting back. Assist with counseling and pharmacotherapy: Counseled for 5 minutes and literature provided. Has tried Chantix and Bupropion in the past, did not tolerate well. Prescribed nicotine patch in the previous visit, was not able to get it as it was not covered by insurance. Nicotine gum prescribed. Arrange for follow up: Follow up in 3 months and continue to offer help.

## 2020-08-11 NOTE — Progress Notes (Signed)
Virtual Visit via Telephone Note   This visit type was conducted due to national recommendations for restrictions regarding the COVID-19 Pandemic (e.g. social distancing) in an effort to limit this patient's exposure and mitigate transmission in our community.  Due to his co-morbid illnesses, this patient is at least at moderate risk for complications without adequate follow up.  This format is felt to be most appropriate for this patient at this time.  The patient did not have access to video technology/had technical difficulties with video requiring transitioning to audio format only (telephone).  All issues noted in this document were discussed and addressed.  No physical exam could be performed with this format.  Evaluation Performed:  Follow-up visit  Date:  08/11/2020   ID:  Christopher Ahr., DOB [birth year], MRN [id number]  Patient Location: Home Provider Location: Office/Clinic  Participants: Patient Location of Patient: Home Location of Provider: Telehealth Consent was obtain for visit to be over via telehealth. I verified that I am speaking with the correct person using two identifiers.  PCP:  Lindell Spar, MD   Chief Complaint:  Left foot pain, routine follow up  History of Present Illness:    Christopher Spiering. is a 44 y.o. male with PMH of anxiety, GERD and tobacco abuse has a televisit for follow-up for his medical conditions and for complaint of left foot pain.  Patient had EGD yesterday for evaluation of severe heartburn sensation.  He was placed on Dexilant by his gastroenterologist.  EGD revealed esophagitis and hiatal hernia.  Patient tolerated procedure well and has started routine diet already.  Today, patient complains of left foot pain, which is intermittent, worse with walking and is not associated with numbness, tingling or weakness in the foot. He was evaluated by a Podiatrist in the past, and was told about Morton's neuroma.  He requested  referral to podiatrist who accepts his insurance.  The patient does not have symptoms concerning for COVID-19 infection (fever, chills, cough, or new shortness of breath).   Past Medical, Surgical, Social History, Allergies, and Medications have been Reviewed.  Past Medical History:  Diagnosis Date  . Anxiety    Chronic  . GERD (gastroesophageal reflux disease)   . Headache   . OCD (obsessive compulsive disorder)    Past Surgical History:  Procedure Laterality Date  . WISDOM TOOTH EXTRACTION       Current Meds  Medication Sig  . calcium carbonate (TUMS - DOSED IN MG ELEMENTAL CALCIUM) 500 MG chewable tablet Chew 1-2 tablets by mouth as needed for indigestion or heartburn.   . dexlansoprazole (DEXILANT) 60 MG capsule Take 1 capsule (60 mg total) by mouth daily.  . sertraline (ZOLOFT) 100 MG tablet Take 1 tablet (100 mg total) by mouth daily.  . sucralfate (CARAFATE) 1 g tablet Take 1 tablet (1 g total) by mouth 4 (four) times daily as needed.   Current Facility-Administered Medications for the 08/11/20 encounter (Video Visit) with Lindell Spar, MD  Medication  . nicotine polacrilex (NICORETTE) gum 2 mg     Allergies:   Paroxetine hcl   ROS:   Please see the history of present illness.    Review of Systems  Constitutional: Negative for chills and fever.  HENT: Negative for congestion, ear discharge, ear pain, sinus pain and sore throat.   Eyes: Negative for pain and redness.  Respiratory: Negative for cough and shortness of breath.   Cardiovascular: Negative for chest pain, palpitations and  leg swelling.  Gastrointestinal: Positive for heartburn. Negative for constipation, diarrhea, melena, nausea and vomiting.  Genitourinary: Negative for dysuria and hematuria.  Musculoskeletal: Positive for neck pain. Negative for falls.  Skin: Negative for rash.  Neurological: Negative for dizziness, tingling, sensory change and focal weakness.  Psychiatric/Behavioral: Negative for  depression and suicidal ideas.     Labs/Other Tests and Data Reviewed:    Recent Labs: No results found for requested labs within last 8760 hours.   Recent Lipid Panel Lab Results  Component Value Date/Time   CHOL 137 03/26/2016 04:50 PM   TRIG 111 03/26/2016 04:50 PM   HDL 61 03/26/2016 04:50 PM   CHOLHDL 2.2 03/26/2016 04:50 PM   LDLCALC 54 03/26/2016 04:50 PM    Wt Readings from Last 3 Encounters:  08/10/20 183 lb (83 kg)  08/01/20 190 lb (86.2 kg)  06/27/20 190 lb 8 oz (86.4 kg)      ASSESSMENT & PLAN:    Esophagitis GERD with esophagitis and hiatal hernia On Dexilant Recently had EGD, advised to continue to follow-up with GI.  Morton's neuroma of left foot Was evaluated by podiatrist in the past.  Due to insurance concerns, requests new referral for podiatrist. Podiatry referral provided.  Tobacco use disorder Asked about quitting: confirms that he currently smokes cigarettes Advise to quit smoking: Educated about QUITTING to reduce the risk of cancer, cardio and cerebrovascular disease. Assess willingness: Unwilling to quit at this time, but is working on cutting back. Assist with counseling and pharmacotherapy: Counseled for 5 minutes and literature provided. Has tried Chantix and Bupropion in the past, did not tolerate well. Prescribed nicotine patch in the previous visit, was not able to get it as it was not covered by insurance. Nicotine gum prescribed. Arrange for follow up: Follow up in 3 months and continue to offer help.  Mixed anxiety and depressive disorder Stable with Zoloft   Time:   Today, I have spent 23 minutes reviewing the chart, including problem list, medications, and with the patient with telehealth technology discussing the above problems.   Medication Adjustments/Labs and Tests Ordered: Current medicines are reviewed at length with the patient today.  Concerns regarding medicines are outlined above.   Tests Ordered: Orders Placed  This Encounter  Procedures  . Ambulatory referral to Podiatry    Medication Changes: Meds ordered this encounter  Medications  . nicotine polacrilex (NICORETTE) gum 2 mg     Note: This dictation was prepared with Dragon dictation along with smaller phrase technology. Similar sounding words can be transcribed inadequately or may not be corrected upon review. Any transcriptional errors that result from this process are unintentional.      Disposition:  Follow up  Signed, Lindell Spar, MD  08/11/2020 6:14 PM     [employer]

## 2020-08-11 NOTE — Assessment & Plan Note (Signed)
Stable with Zoloft

## 2020-08-11 NOTE — Patient Instructions (Signed)
Please continue to take Dexilant as prescribed.  Please try to cut down/quit smoking as it would worsen your esophagitis in addition to increasing risk of cancer and cardiovascular problems.  You are being referred to Podiatrist for management of Morton Neuroma.

## 2020-08-16 ENCOUNTER — Encounter (HOSPITAL_COMMUNITY): Payer: Self-pay | Admitting: Internal Medicine

## 2020-08-18 ENCOUNTER — Ambulatory Visit (INDEPENDENT_AMBULATORY_CARE_PROVIDER_SITE_OTHER): Payer: Medicaid Other

## 2020-08-18 ENCOUNTER — Ambulatory Visit: Payer: Medicaid Other | Admitting: Podiatry

## 2020-08-18 ENCOUNTER — Other Ambulatory Visit: Payer: Self-pay

## 2020-08-18 DIAGNOSIS — Z01818 Encounter for other preprocedural examination: Secondary | ICD-10-CM

## 2020-08-18 DIAGNOSIS — G5762 Lesion of plantar nerve, left lower limb: Secondary | ICD-10-CM

## 2020-08-22 ENCOUNTER — Encounter: Payer: Self-pay | Admitting: Podiatry

## 2020-08-22 NOTE — Progress Notes (Signed)
Subjective:  Patient ID: Christopher Ahr., male    DOB: October 02, 1975,  MRN: [id number]  Chief Complaint  Patient presents with  . Neuroma    Left foot neuroma. PT would like to discuss surgery     44 y.o. male presents with the above complaint. Patient presents with complaint left third interspace neuroma.  Patient states is painful to touch.  Patient states it painful to walk on.  Patient has tried different conservative treatment options include shoe gear modification insoles padding but none of which has helped.  He would like to discuss surgical removal of the left foot Morton's neuroma.  He denies any other acute complaints.  He states that he was treated by someone else which helped a little bit but has not gotten better.   Review of Systems: Negative except as noted in the HPI. Denies N/V/F/Ch.  Past Medical History:  Diagnosis Date  . Anxiety    Chronic  . GERD (gastroesophageal reflux disease)   . Headache   . OCD (obsessive compulsive disorder)     Current Outpatient Medications:  .  calcium carbonate (TUMS - DOSED IN MG ELEMENTAL CALCIUM) 500 MG chewable tablet, Chew 1-2 tablets by mouth as needed for indigestion or heartburn. , Disp: , Rfl:  .  dexlansoprazole (DEXILANT) 60 MG capsule, Take 1 capsule (60 mg total) by mouth daily., Disp: 30 capsule, Rfl: 3 .  sertraline (ZOLOFT) 100 MG tablet, Take 1 tablet (100 mg total) by mouth daily., Disp: 30 tablet, Rfl: 1 .  sucralfate (CARAFATE) 1 g tablet, Take 1 tablet (1 g total) by mouth 4 (four) times daily as needed., Disp: 120 tablet, Rfl: 2  Current Facility-Administered Medications:  .  nicotine polacrilex (NICORETTE) gum 2 mg, 2 mg, Oral, PRN, Lindell Spar, MD  Social History   Tobacco Use  Smoking Status Current Every Day Smoker  . Packs/day: 0.50  . Types: Cigarettes  Smokeless Tobacco Former Systems developer  . Types: Snuff    Allergies  Allergen Reactions  . Paroxetine Hcl Other (See Comments)    Erectile  dysfunction   Objective:  There were no vitals filed for this visit. There is no height or weight on file to calculate BMI. Constitutional Well developed. Well nourished.  Vascular Dorsalis pedis pulses palpable bilaterally. Posterior tibial pulses palpable bilaterally. Capillary refill normal to all digits.  No cyanosis or clubbing noted. Pedal hair growth normal.  Neurologic Normal speech. Oriented to person, place, and time. Epicritic sensation to light touch grossly present bilaterally.  Dermatologic Nails well groomed and normal in appearance. No open wounds. No skin lesions.  Orthopedic:  Positive Mulder's click noted left third interspace.  No pain with range of motion of second and third metatarsophalangeal joint.  No intra-articular pain noted.  Mild positive [place] sign.  Subjective numbness and tingling noted to the facing sides of third and fourth digit.   Radiographs: 3 views of skeletally mature adult left foot: No osseous break noted.  No bony abnormalities identified x-ray within normal limits Assessment:   1. Morton's neuroma of left foot   2. Preoperative examination    Plan:  Patient was evaluated and treated and all questions answered.  Left third interspace neuroma -I explained to patient the etiology of neuroma and various treatment options were discussed.  Given the amount of pain that he is having without any relief from conservative treatment options I believe patient would benefit from surgical excision of the neuroma.  I discussed with the  patient in extensive detail my surgical plan as well as my postop protocol which includes weightbearing as tolerated in a boot.  Patient states understanding and would like to proceed with excision of the neuroma. -Informed surgical risk consent was reviewed and read aloud to the patient.  I reviewed the films.  I have discussed my findings with the patient in great detail.  I have discussed all risks including but not  limited to infection, stiffness, scarring, limp, disability, deformity, damage to blood vessels and nerves, numbness, poor healing, need for braces, arthritis, chronic pain, amputation, death.  All benefits and realistic expectations discussed in great detail.  I have made no promises as to the outcome.  I have provided realistic expectations.  I have offered the patient a 2nd opinion, which they have declined and assured me they preferred to proceed despite the risks -A total of 46 minutes was spent in direct patient care as well as pre and post patient encounter activities.  This includes documentation as well as reviewing patient chart for labs, imaging, past medical, surgical, social, and family history as documented in the EMR.  I have reviewed medication allergies as documented in EMR.  I discussed the etiology of condition and treatment options from conservative to surgical care.  All risks and benefit of the treatment course was discussed in detail.  All questions were answered and return appointment was discussed.  Since the visit completed in an ambulatory/outpatient setting, the patient and/or parent/guardian has been advised to contact the providers office for worsening condition and seek medical treatment and/or call 911 if the patient deems either is necessary.   No follow-ups on file.

## 2020-09-11 ENCOUNTER — Telehealth: Payer: Self-pay | Admitting: Internal Medicine

## 2020-09-11 NOTE — Telephone Encounter (Signed)
[phone] please call patient, he has some questions about his procedure results and he is also still having reflux.

## 2020-09-12 NOTE — Telephone Encounter (Signed)
Yes the patient should be taking Carafate and Dexilant.  His EGD showed significant inflammation of his esophagus (swallowing tube) from GERD/reflux.  This is why we want him to be on the Dexilant 60 mg daily and Carafate before meals and nightly.  Dr. Jena Gauss also recommended cutting back on alcohol, tobacco, and carbonated beverages which can also worsen esophagitis.  We may need to repeat his EGD next year.  If he does not get relief from his medications there is a possibility of referring him for surgery to help with his reflux.  He did also have a small hiatal hernia (this is quite common in the general population and we generally do not do anything unless it becomes large).  Let me know if any other questions.

## 2020-09-12 NOTE — Telephone Encounter (Signed)
Spoke with pt. Pt had an EGD 07/2020. Pt was previously prescribed Carafate 1 gm 4 times daily and Dexilant 60 mg daily. Pt has been taking the Carafate 1 gm 4 times daily as directed and didn't realize that he needed to take Dexilant 60 mg daily. Dexilant 60 mg was covered under pts insurance. PA was completed and approved 07/2020 for Dexilant 60 mg. Pt mentioned that he continues to have Genella Rife and is aware that he needs to start taking the Dexilant 60 mg for the Grd symptoms. Pt would like to know what the results of his EGD is. Please advise in the absence of Dr. Jena Gauss.

## 2020-09-13 NOTE — Telephone Encounter (Signed)
Spoke with pt. Pt was notified of Wynne Dust, NP and Dr. Luvenia Starch recommendations. Pt is going to take the Dexilant 60 mg and Carafate. If medication isn't helping pt, he is going to call back.

## 2020-09-20 ENCOUNTER — Telehealth: Payer: Self-pay | Admitting: Podiatry

## 2020-09-20 ENCOUNTER — Ambulatory Visit (INDEPENDENT_AMBULATORY_CARE_PROVIDER_SITE_OTHER): Payer: Medicaid Other | Admitting: Nurse Practitioner

## 2020-09-20 ENCOUNTER — Other Ambulatory Visit: Payer: Self-pay

## 2020-09-20 ENCOUNTER — Encounter: Payer: Self-pay | Admitting: Nurse Practitioner

## 2020-09-20 VITALS — BP 122/84 | HR 76 | Temp 98.6°F | Resp 20 | Ht 72.0 in | Wt 189.0 lb

## 2020-09-20 DIAGNOSIS — G5762 Lesion of plantar nerve, left lower limb: Secondary | ICD-10-CM

## 2020-09-20 DIAGNOSIS — Z01818 Encounter for other preprocedural examination: Secondary | ICD-10-CM

## 2020-09-20 NOTE — H&P (View-Only) (Signed)
Established Patient Office Visit  Subjective:  Patient ID: Christopher Ahr., male    DOB: 01/01/1976  Age: 45 y.o. MRN: [id number]  CC: No chief complaint on file.   HPI [employer]. presents for surgical clearance.  He is scheduled for excision of Morton's neuroma and removal of ingrown toenails of bilateral great toes on 09/25/20.  Past Medical History:  Diagnosis Date  . Anxiety    Chronic  . GERD (gastroesophageal reflux disease)   . Headache   . OCD (obsessive compulsive disorder)     Past Surgical History:  Procedure Laterality Date  . ESOPHAGOGASTRODUODENOSCOPY (EGD) WITH PROPOFOL N/A 08/10/2020   Procedure: ESOPHAGOGASTRODUODENOSCOPY (EGD) WITH PROPOFOL;  Surgeon: Daneil Dolin, MD;  Location: [facility];  Service: Endoscopy;  Laterality: N/A;  1:45pm  . WISDOM TOOTH EXTRACTION      Family History  Problem Relation Age of Onset  . Depression Mother   . Anxiety disorder Mother   . Depression Sister   . Bipolar disorder Paternal Uncle   . Schizophrenia Paternal Uncle   . Alcohol abuse Maternal Uncle   . Hypertension Father   . Colon cancer Neg Hx     Social History   Socioeconomic History  . Marital status: Significant Other    Spouse name: Not on file  . Number of children: 1  . Years of education: 64  . Highest education level: High school graduate  Occupational History  . Occupation: local truck driver  Tobacco Use  . Smoking status: Current Every Day Smoker    Packs/day: 0.50    Types: Cigarettes  . Smokeless tobacco: Former Systems developer    Types: Snuff  Vaping Use  . Vaping Use: Never used  Substance and Sexual Activity  . Alcohol use: Yes    Comment: 1-2 glasses of wine daily  . Drug use: No  . Sexual activity: Yes  Other Topics Concern  . Not on file  Social History Narrative   Lives at home, along with his son and parents.   Left-sided.   1-3 cup coffee, occasional soda.   Social Determinants of Health   Financial  Resource Strain: Not on file  Food Insecurity: Not on file  Transportation Needs: Not on file  Physical Activity: Not on file  Stress: Not on file  Social Connections: Not on file  Intimate Partner Violence: Not on file    Outpatient Medications Prior to Visit  Medication Sig Dispense Refill  . calcium carbonate (TUMS - DOSED IN MG ELEMENTAL CALCIUM) 500 MG chewable tablet Chew 1-2 tablets by mouth as needed for indigestion or heartburn.     . dexlansoprazole (DEXILANT) 60 MG capsule Take 1 capsule (60 mg total) by mouth daily. 30 capsule 3  . sertraline (ZOLOFT) 100 MG tablet Take 1 tablet (100 mg total) by mouth daily. 30 tablet 1  . sucralfate (CARAFATE) 1 g tablet Take 1 tablet (1 g total) by mouth 4 (four) times daily as needed. 120 tablet 2   Facility-Administered Medications Prior to Visit  Medication Dose Route Frequency Provider Last Rate Last Admin  . nicotine polacrilex (NICORETTE) gum 2 mg  2 mg Oral PRN Lindell Spar, MD        Allergies  Allergen Reactions  . Paroxetine Hcl Other (See Comments)    Erectile dysfunction    ROS Review of Systems  Constitutional: Negative.   HENT: Negative.   Eyes: Negative.   Respiratory: Negative.   Cardiovascular: Negative.  Gastrointestinal: Negative.   Endocrine: Negative.   Genitourinary: Negative.   Musculoskeletal:       Pain to left 3rd toe  Allergic/Immunologic: Negative.   Neurological: Negative.   Hematological: Negative.   Psychiatric/Behavioral: Negative.       Objective:    Physical Exam Constitutional:      Appearance: Normal appearance.  HENT:     Head: Normocephalic and atraumatic.  Eyes:     Extraocular Movements: Extraocular movements intact.     Conjunctiva/sclera: Conjunctivae normal.     Pupils: Pupils are equal, round, and reactive to light.  Cardiovascular:     Rate and Rhythm: Normal rate and regular rhythm.     Pulses: Normal pulses.     Heart sounds: Normal heart sounds.  Pulmonary:      Effort: Pulmonary effort is normal.     Breath sounds: Normal breath sounds.  Abdominal:     General: Abdomen is flat.     Palpations: Abdomen is soft.  Musculoskeletal:     Cervical back: Normal range of motion and neck supple.     Comments: Morton's neuroma to left 3rd toe  Skin:    General: Skin is warm and dry.     Capillary Refill: Capillary refill takes less than 2 seconds.  Neurological:     General: No focal deficit present.     Mental Status: He is alert and oriented to person, place, and time.  Psychiatric:        Mood and Affect: Mood normal.        Behavior: Behavior normal.        Thought Content: Thought content normal.        Judgment: Judgment normal.     There were no vitals taken for this visit. Wt Readings from Last 3 Encounters:  08/10/20 183 lb (83 kg)  08/01/20 190 lb (86.2 kg)  06/27/20 190 lb 8 oz (86.4 kg)     Health Maintenance Due  Topic Date Due  . Hepatitis C Screening  Never done  . COVID-19 Vaccine (1) Never done  . TETANUS/TDAP  Never done    There are no preventive care reminders to display for this patient.  Lab Results  Component Value Date   TSH 1.02 12/03/2018   Lab Results  Component Value Date   WBC 9.4 12/03/2018   HGB 17.7 (H) 12/03/2018   HCT 52.0 (H) 12/03/2018   MCV 90.4 12/03/2018   PLT 248 12/03/2018   Lab Results  Component Value Date   NA 139 12/03/2018   K 5.2 12/03/2018   CO2 22 12/03/2018   GLUCOSE 88 12/03/2018   BUN 6 (L) 12/03/2018   CREATININE 0.90 12/03/2018   BILITOT 0.9 12/03/2018   ALKPHOS 54 03/26/2016   AST 21 12/03/2018   ALT 21 12/03/2018   PROT 7.3 12/03/2018   ALBUMIN 4.8 03/26/2016   CALCIUM 10.2 12/03/2018   Lab Results  Component Value Date   CHOL 137 03/26/2016   Lab Results  Component Value Date   HDL 61 03/26/2016   Lab Results  Component Value Date   LDLCALC 54 03/26/2016   Lab Results  Component Value Date   TRIG 111 03/26/2016   Lab Results  Component  Value Date   CHOLHDL 2.2 03/26/2016   Lab Results  Component Value Date   HGBA1C 5.2 12/03/2018      Assessment & Plan:   Problem List Items Addressed This Visit      Nervous and  Auditory   Morton's neuroma of left foot    -met with podiatrist -has surgery scheduled for 09/25/20         Other   Pre-op exam    -CBC and CMP performed today -EKG is NSR -ordered CBC and CMP; patient refusing labs today d/t fear of needles -surgeon can postpone surgery if they need labs        Other Visit Diagnoses    Pre-op evaluation    -  Primary   Relevant Orders   CBC with Differential/Platelet   CMP14+EGFR      No orders of the defined types were placed in this encounter.   Follow-up: Return if symptoms worsen or fail to improve, for With podiatrist regarding surgery.    Noreene Larsson, NP

## 2020-09-20 NOTE — Telephone Encounter (Signed)
Patient has requested return call regarding surgery, patient stated he spoke with [facility] already and didn't receive definite answer, Please Advise   [place] [phone]

## 2020-09-20 NOTE — Patient Instructions (Addendum)
You refused labs for pre-operative clearance.  If the surgeon requires labs, your procedure will be postponed. We will get the office note to the [facility].

## 2020-09-20 NOTE — Assessment & Plan Note (Addendum)
-  EKG is NSR -ordered CBC and CMP; patient refusing labs today d/t fear of needles -surgeon can postpone surgery if they need labs -we discussed the need to get labs x2, but pt refuses

## 2020-09-20 NOTE — Assessment & Plan Note (Signed)
-  met with podiatrist -has surgery scheduled for 09/25/20

## 2020-09-20 NOTE — Progress Notes (Signed)
 Established Patient Office Visit  Subjective:  Patient ID: Christopher A Lesinski Jr., male    DOB: 10/02/1975  Age: 44 y.o. MRN: [id number]  CC: No chief complaint on file.   HPI Christopher A Dust Jr. presents for surgical clearance.  He is scheduled for excision of Morton's neuroma and removal of ingrown toenails of bilateral great toes on 09/25/20.  Past Medical History:  Diagnosis Date  . Anxiety    Chronic  . GERD (gastroesophageal reflux disease)   . Headache   . OCD (obsessive compulsive disorder)     Past Surgical History:  Procedure Laterality Date  . ESOPHAGOGASTRODUODENOSCOPY (EGD) WITH PROPOFOL N/A 08/10/2020   Procedure: ESOPHAGOGASTRODUODENOSCOPY (EGD) WITH PROPOFOL;  Surgeon: Rourk, Robert M, MD;  Location: [facility];  Service: Endoscopy;  Laterality: N/A;  1:45pm  . WISDOM TOOTH EXTRACTION      Family History  Problem Relation Age of Onset  . Depression Mother   . Anxiety disorder Mother   . Depression Sister   . Bipolar disorder Paternal Uncle   . Schizophrenia Paternal Uncle   . Alcohol abuse Maternal Uncle   . Hypertension Father   . Colon cancer Neg Hx     Social History   Socioeconomic History  . Marital status: Significant Other    Spouse name: Not on file  . Number of children: 1  . Years of education: 12  . Highest education level: High school graduate  Occupational History  . Occupation: local truck driver  Tobacco Use  . Smoking status: Current Every Day Smoker    Packs/day: 0.50    Types: Cigarettes  . Smokeless tobacco: Former User    Types: Snuff  Vaping Use  . Vaping Use: Never used  Substance and Sexual Activity  . Alcohol use: Yes    Comment: 1-2 glasses of wine daily  . Drug use: No  . Sexual activity: Yes  Other Topics Concern  . Not on file  Social History Narrative   Lives at home, along with his son and parents.   Left-sided.   1-3 cup coffee, occasional soda.   Social Determinants of Health   Financial  Resource Strain: Not on file  Food Insecurity: Not on file  Transportation Needs: Not on file  Physical Activity: Not on file  Stress: Not on file  Social Connections: Not on file  Intimate Partner Violence: Not on file    Outpatient Medications Prior to Visit  Medication Sig Dispense Refill  . calcium carbonate (TUMS - DOSED IN MG ELEMENTAL CALCIUM) 500 MG chewable tablet Chew 1-2 tablets by mouth as needed for indigestion or heartburn.     . dexlansoprazole (DEXILANT) 60 MG capsule Take 1 capsule (60 mg total) by mouth daily. 30 capsule 3  . sertraline (ZOLOFT) 100 MG tablet Take 1 tablet (100 mg total) by mouth daily. 30 tablet 1  . sucralfate (CARAFATE) 1 g tablet Take 1 tablet (1 g total) by mouth 4 (four) times daily as needed. 120 tablet 2   Facility-Administered Medications Prior to Visit  Medication Dose Route Frequency Provider Last Rate Last Admin  . nicotine polacrilex (NICORETTE) gum 2 mg  2 mg Oral PRN Patel, Rutwik K, MD        Allergies  Allergen Reactions  . Paroxetine Hcl Other (See Comments)    Erectile dysfunction    ROS Review of Systems  Constitutional: Negative.   HENT: Negative.   Eyes: Negative.   Respiratory: Negative.   Cardiovascular: Negative.     Gastrointestinal: Negative.   Endocrine: Negative.   Genitourinary: Negative.   Musculoskeletal:       Pain to left 3rd toe  Allergic/Immunologic: Negative.   Neurological: Negative.   Hematological: Negative.   Psychiatric/Behavioral: Negative.       Objective:    Physical Exam Constitutional:      Appearance: Normal appearance.  HENT:     Head: Normocephalic and atraumatic.  Eyes:     Extraocular Movements: Extraocular movements intact.     Conjunctiva/sclera: Conjunctivae normal.     Pupils: Pupils are equal, round, and reactive to light.  Cardiovascular:     Rate and Rhythm: Normal rate and regular rhythm.     Pulses: Normal pulses.     Heart sounds: Normal heart sounds.  Pulmonary:      Effort: Pulmonary effort is normal.     Breath sounds: Normal breath sounds.  Abdominal:     General: Abdomen is flat.     Palpations: Abdomen is soft.  Musculoskeletal:     Cervical back: Normal range of motion and neck supple.     Comments: Morton's neuroma to left 3rd toe  Skin:    General: Skin is warm and dry.     Capillary Refill: Capillary refill takes less than 2 seconds.  Neurological:     General: No focal deficit present.     Mental Status: He is alert and oriented to person, place, and time.  Psychiatric:        Mood and Affect: Mood normal.        Behavior: Behavior normal.        Thought Content: Thought content normal.        Judgment: Judgment normal.     There were no vitals taken for this visit. Wt Readings from Last 3 Encounters:  08/10/20 183 lb (83 kg)  08/01/20 190 lb (86.2 kg)  06/27/20 190 lb 8 oz (86.4 kg)     Health Maintenance Due  Topic Date Due  . Hepatitis C Screening  Never done  . COVID-19 Vaccine (1) Never done  . TETANUS/TDAP  Never done    There are no preventive care reminders to display for this patient.  Lab Results  Component Value Date   TSH 1.02 12/03/2018   Lab Results  Component Value Date   WBC 9.4 12/03/2018   HGB 17.7 (H) 12/03/2018   HCT 52.0 (H) 12/03/2018   MCV 90.4 12/03/2018   PLT 248 12/03/2018   Lab Results  Component Value Date   NA 139 12/03/2018   K 5.2 12/03/2018   CO2 22 12/03/2018   GLUCOSE 88 12/03/2018   BUN 6 (L) 12/03/2018   CREATININE 0.90 12/03/2018   BILITOT 0.9 12/03/2018   ALKPHOS 54 03/26/2016   AST 21 12/03/2018   ALT 21 12/03/2018   PROT 7.3 12/03/2018   ALBUMIN 4.8 03/26/2016   CALCIUM 10.2 12/03/2018   Lab Results  Component Value Date   CHOL 137 03/26/2016   Lab Results  Component Value Date   HDL 61 03/26/2016   Lab Results  Component Value Date   LDLCALC 54 03/26/2016   Lab Results  Component Value Date   TRIG 111 03/26/2016   Lab Results  Component  Value Date   CHOLHDL 2.2 03/26/2016   Lab Results  Component Value Date   HGBA1C 5.2 12/03/2018      Assessment & Plan:   Problem List Items Addressed This Visit      Nervous and   Auditory   Morton's neuroma of left foot    -met with podiatrist -has surgery scheduled for 09/25/20         Other   Pre-op exam    -CBC and CMP performed today -EKG is NSR -ordered CBC and CMP; patient refusing labs today d/t fear of needles -surgeon can postpone surgery if they need labs        Other Visit Diagnoses    Pre-op evaluation    -  Primary   Relevant Orders   CBC with Differential/Platelet   CMP14+EGFR      No orders of the defined types were placed in this encounter.   Follow-up: Return if symptoms worsen or fail to improve, for With podiatrist regarding surgery.    Christopher Jafri M Aris Even, NP 

## 2020-09-21 ENCOUNTER — Other Ambulatory Visit (HOSPITAL_COMMUNITY)
Admission: RE | Admit: 2020-09-21 | Discharge: 2020-09-21 | Disposition: A | Discharge status: Home / Self Care | Payer: Medicaid Other | Source: Ambulatory Visit | Attending: Podiatry | Admitting: Podiatry

## 2020-09-21 ENCOUNTER — Encounter (HOSPITAL_BASED_OUTPATIENT_CLINIC_OR_DEPARTMENT_OTHER): Payer: Self-pay | Admitting: Podiatry

## 2020-09-21 ENCOUNTER — Other Ambulatory Visit: Payer: Self-pay

## 2020-09-21 DIAGNOSIS — Z20822 Contact with and (suspected) exposure to covid-19: Secondary | ICD-10-CM | POA: Insufficient documentation

## 2020-09-21 DIAGNOSIS — Z01812 Encounter for preprocedural laboratory examination: Secondary | ICD-10-CM | POA: Diagnosis not present

## 2020-09-21 LAB — SARS CORONAVIRUS 2 (TAT 6-24 HRS): SARS Coronavirus 2: NEGATIVE

## 2020-09-21 NOTE — Progress Notes (Addendum)
Spoke w/ via phone for pre-op interview---pt Lab needs dos----   none            Lab results-----cbc with dif, cmp + 14 09-20-2020 epic COVID test ------09-21-2020 1005 Arrive at -------1130 am 09-25-2020 NPO after MN NO Solid Food.  Clear liquids from MN until---1030 am then npo Medications to take morning of surgery -----dexilant, pt wishes to take sertraline after surgeyr Diabetic medication -----n/a Patient Special Instructions ----none- Pre-Op special Istructions -----none Patient verbalized understanding of instructions that were given at this phone interview. Patient denies shortness of breath, chest pain, fever, cough at this phone interview.  H & P joseph gray np 07-21-2021 on chart for 09-25-2020 surgery  ekg 09-20-2020 epic

## 2020-10-04 ENCOUNTER — Encounter: Payer: Medicaid Other | Admitting: Podiatry

## 2020-10-12 ENCOUNTER — Other Ambulatory Visit (HOSPITAL_COMMUNITY)
Admission: RE | Admit: 2020-10-12 | Discharge: 2020-10-12 | Disposition: A | Discharge status: Home / Self Care | Payer: Medicaid Other | Source: Ambulatory Visit | Attending: Podiatry | Admitting: Podiatry

## 2020-10-12 DIAGNOSIS — Z20822 Contact with and (suspected) exposure to covid-19: Secondary | ICD-10-CM | POA: Diagnosis not present

## 2020-10-12 DIAGNOSIS — Z01812 Encounter for preprocedural laboratory examination: Secondary | ICD-10-CM | POA: Diagnosis not present

## 2020-10-12 LAB — SARS CORONAVIRUS 2 (TAT 6-24 HRS): SARS Coronavirus 2: NEGATIVE

## 2020-10-12 NOTE — Progress Notes (Signed)
Spoke w/ via phone for pre-op interview---pt Lab needs dos----   none            Lab results-----none COVID test ------10-12-2020 1010 Arrive at -------1000 am 10-16-2020 NPO after MN NO Solid Food.  Clear liquids from MN until---900  am then npo Medications to take morning of surgery -----dexilant, pt wishes to take sertraline after surgery Diabetic medication -----n/a Patient Special Instructions ----none- Pre-Op special Istructions -----none Patient verbalized understanding of instructions that were given at this phone interview. Patient denies shortness of breath, chest pain, fever, cough at this phone interview.  H & P joseph gray np 07-21-2021 on chart for 10-16-2020 surgery  ekg 09-20-2020 epic   Patient states no chnages in medical history or medications since 09-19-2020 telephone interview

## 2020-10-13 NOTE — Progress Notes (Deleted)
BH MD/PA/NP OP Progress Note  10/13/2020 9:19 AM Christopher A Dunagan Jr.  MRN:  [id number]  Chief Complaint:  HPI: *** Visit Diagnosis: No diagnosis found.  Past Psychiatric History: Please see initial evaluation for full details. I have reviewed the history. No updates at this time.     Past Medical History:  Past Medical History:  Diagnosis Date  . Anxiety    Chronic  . GERD (gastroesophageal reflux disease)   . Headache   . Neuroma   . OCD (obsessive compulsive disorder)     Past Surgical History:  Procedure Laterality Date  . CIRCUMCISION  2004  . ESOPHAGOGASTRODUODENOSCOPY (EGD) WITH PROPOFOL N/A 08/10/2020   Procedure: ESOPHAGOGASTRODUODENOSCOPY (EGD) WITH PROPOFOL;  Surgeon: Daneil Dolin, MD;  Location: [facility];  Service: Endoscopy;  Laterality: N/A;  1:45pm  . WISDOM TOOTH EXTRACTION  2011    Family Psychiatric History: Please see initial evaluation for full details. I have reviewed the history. No updates at this time.     Family History:  Family History  Problem Relation Age of Onset  . Depression Mother   . Anxiety disorder Mother   . Depression Sister   . Bipolar disorder Paternal Uncle   . Schizophrenia Paternal Uncle   . Alcohol abuse Maternal Uncle   . Hypertension Father   . Colon cancer Neg Hx     Social History:  Social History   Socioeconomic History  . Marital status: Significant Other    Spouse name: Not on file  . Number of children: 1  . Years of education: 70  . Highest education level: High school graduate  Occupational History  . Occupation: local truck driver  Tobacco Use  . Smoking status: Current Every Day Smoker    Packs/day: 0.50    Years: 4.00    Pack years: 2.00    Types: Cigarettes  . Smokeless tobacco: Former Systems developer    Types: Snuff  Vaping Use  . Vaping Use: Never used  Substance and Sexual Activity  . Alcohol use: Yes    Comment: 1-2 glasses of wine  few x week  . Drug use: No  . Sexual activity: Yes   Other Topics Concern  . Not on file  Social History Narrative   Lives at home, along with his son and parents.   Left-sided.   1-3 cup coffee, occasional soda.   Social Determinants of Health   Financial Resource Strain: Not on file  Food Insecurity: Not on file  Transportation Needs: Not on file  Physical Activity: Not on file  Stress: Not on file  Social Connections: Not on file    Allergies:  Allergies  Allergen Reactions  . Paroxetine Hcl Other (See Comments)    Erectile dysfunction    Metabolic Disorder Labs: Lab Results  Component Value Date   HGBA1C 5.2 12/03/2018   MPG 103 12/03/2018   No results found for: PROLACTIN Lab Results  Component Value Date   CHOL 137 03/26/2016   TRIG 111 03/26/2016   HDL 61 03/26/2016   CHOLHDL 2.2 03/26/2016   VLDL 22 03/26/2016   LDLCALC 54 03/26/2016   Lab Results  Component Value Date   TSH 1.02 12/03/2018   TSH 1.39 03/26/2016    Therapeutic Level Labs: No results found for: LITHIUM No results found for: VALPROATE No components found for:  CBMZ  Current Medications: Current Outpatient Medications  Medication Sig Dispense Refill  . calcium carbonate (TUMS - DOSED IN MG ELEMENTAL CALCIUM) 500  MG chewable tablet Chew 1-2 tablets by mouth as needed for indigestion or heartburn.     . dexlansoprazole (DEXILANT) 60 MG capsule Take 1 capsule (60 mg total) by mouth daily. 30 capsule 3  . sertraline (ZOLOFT) 100 MG tablet Take 1 tablet (100 mg total) by mouth daily. 30 tablet 1  . sucralfate (CARAFATE) 1 g tablet Take 1 tablet (1 g total) by mouth 4 (four) times daily as needed. 120 tablet 2   Current Facility-Administered Medications  Medication Dose Route Frequency Provider Last Rate Last Admin  . nicotine polacrilex (NICORETTE) gum 2 mg  2 mg Oral PRN Lindell Spar, MD         Musculoskeletal: Strength & Muscle Tone: N/A Gait & Station: N/A Patient leans: N/A  Psychiatric Specialty Exam: Review of Systems   There were no vitals taken for this visit.There is no height or weight on file to calculate BMI.  General Appearance: {Appearance:22683}  Eye Contact:  {BHH EYE CONTACT:22684}  Speech:  Clear and Coherent  Volume:  Normal  Mood:  {BHH MOOD:22306}  Affect:  {Affect (PAA):22687}  Thought Process:  Coherent  Orientation:  Full (Time, Place, and Person)  Thought Content: Logical   Suicidal Thoughts:  {ST/HT (PAA):22692}  Homicidal Thoughts:  {ST/HT (PAA):22692}  Memory:  Immediate;   Good  Judgement:  {Judgement (PAA):22694}  Insight:  {Insight (PAA):22695}  Psychomotor Activity:  Normal  Concentration:  Concentration: Good and Attention Span: Good  Recall:  Good  Fund of Knowledge: Good  Language: Good  Akathisia:  No  Handed:  Right  AIMS (if indicated): not done  Assets:  Communication Skills Desire for Improvement  ADL's:  Intact  Cognition: WNL  Sleep:  {B[facility] GOOD/FAIR/POOR:22877}   Screenings: PHQ2-9   Flowsheet Row Office Visit from 09/20/2020 in [facility] Video Visit from 08/11/2020 in [facility] Office Visit from 06/13/2020 in [facility] Video Visit from 05/25/2020 in [facility] Office Visit from 04/20/2020 in [facility]  PHQ-2 Total Score 0 0 1 0 0  PHQ-9 Total Score -- -- 3 -- --       Assessment and Plan:  Christopher Laur. is a 45 y.o. year old male with a history of  OCD, anxiety, who presents for follow up appointment for below.    1. Mixed obsessional thoughts and acts 2. Anxiety Although he continues to have OCD symptoms and anxiety, there has been overall improvement in his symptoms.  Although he will benefit from further up titration of sertraline, will not do this at this time given his new GI symptoms today, which is more attributable to his physical condition.  We will continue current dose of sertraline to target OCD and anxiety.  Will consider switching to another antidepressant if any  worsening in GI symptoms.   Plan 1.Continuesertraline100mg  daily - monitor loose stools 2.Next appointment:11/22 at 11 AM for 30 mins, video  Past trials of medication:Paxil, citalopram,lexapro,fluoxetine,venlafaxine(drowsiness),clomipramine,bupropion, Xanax (memory loss), clonazepam (drowsy)  The patient demonstrates the following risk factors for suicide: Chronic risk factors for suicide include:psychiatric disorder ofanxiety. Acute risk factorsfor suicide include: N/A. Protective factorsfor this patient include: positive social support, responsibility to others (children, family), coping skills and hope for the future. Considering these factors, the overall suicide risk at this point appears to below. Patientisappropriate for outpatient follow up.  Norman Clay, MD 10/13/2020, 9:19 AM

## 2020-10-16 ENCOUNTER — Other Ambulatory Visit: Payer: Self-pay | Admitting: Podiatry

## 2020-10-16 ENCOUNTER — Ambulatory Visit (HOSPITAL_BASED_OUTPATIENT_CLINIC_OR_DEPARTMENT_OTHER)
Admission: RE | Admit: 2020-10-16 | Discharge: 2020-10-16 | Disposition: A | Discharge status: Home / Self Care | Payer: Medicaid Other | Attending: Podiatry | Admitting: Podiatry

## 2020-10-16 ENCOUNTER — Other Ambulatory Visit: Payer: Self-pay

## 2020-10-16 ENCOUNTER — Encounter (HOSPITAL_BASED_OUTPATIENT_CLINIC_OR_DEPARTMENT_OTHER)
Admission: RE | Disposition: A | Discharge status: Home / Self Care | Payer: Self-pay | Source: Home / Self Care | Attending: Podiatry

## 2020-10-16 ENCOUNTER — Encounter (HOSPITAL_BASED_OUTPATIENT_CLINIC_OR_DEPARTMENT_OTHER): Payer: Self-pay | Admitting: Anesthesiology

## 2020-10-16 ENCOUNTER — Ambulatory Visit (HOSPITAL_BASED_OUTPATIENT_CLINIC_OR_DEPARTMENT_OTHER): Payer: Self-pay | Admitting: Anesthesiology

## 2020-10-16 ENCOUNTER — Encounter (HOSPITAL_BASED_OUTPATIENT_CLINIC_OR_DEPARTMENT_OTHER): Payer: Self-pay | Admitting: Podiatry

## 2020-10-16 DIAGNOSIS — K219 Gastro-esophageal reflux disease without esophagitis: Secondary | ICD-10-CM | POA: Diagnosis not present

## 2020-10-16 DIAGNOSIS — D3613 Benign neoplasm of peripheral nerves and autonomic nervous system of lower limb, including hip: Secondary | ICD-10-CM | POA: Diagnosis not present

## 2020-10-16 DIAGNOSIS — F17210 Nicotine dependence, cigarettes, uncomplicated: Secondary | ICD-10-CM | POA: Diagnosis not present

## 2020-10-16 DIAGNOSIS — Z8249 Family history of ischemic heart disease and other diseases of the circulatory system: Secondary | ICD-10-CM | POA: Insufficient documentation

## 2020-10-16 DIAGNOSIS — G5762 Lesion of plantar nerve, left lower limb: Secondary | ICD-10-CM | POA: Insufficient documentation

## 2020-10-16 DIAGNOSIS — L600 Ingrowing nail: Secondary | ICD-10-CM | POA: Diagnosis not present

## 2020-10-16 DIAGNOSIS — F418 Other specified anxiety disorders: Secondary | ICD-10-CM | POA: Diagnosis not present

## 2020-10-16 DIAGNOSIS — G5792 Unspecified mononeuropathy of left lower limb: Secondary | ICD-10-CM | POA: Diagnosis not present

## 2020-10-16 HISTORY — PX: EXCISION MORTON'S NEUROMA: SHX5013

## 2020-10-16 HISTORY — DX: Benign neoplasm of peripheral nerves and autonomic nervous system, unspecified: D36.10

## 2020-10-16 SURGERY — EXCISION, MORTON'S NEUROMA
Anesthesia: General | Site: Foot

## 2020-10-16 SURGERY — EXCISION, MORTON'S NEUROMA
Anesthesia: Choice | Laterality: Left

## 2020-10-16 MED ORDER — MIDAZOLAM HCL 2 MG/2ML IJ SOLN
INTRAMUSCULAR | Status: AC
  Filled 2020-10-16: qty 2

## 2020-10-16 MED ORDER — CEFAZOLIN SODIUM-DEXTROSE 2-4 GM/100ML-% IV SOLN
INTRAVENOUS | Status: AC
  Filled 2020-10-16: qty 100

## 2020-10-16 MED ORDER — FENTANYL CITRATE (PF) 100 MCG/2ML IJ SOLN: 25.0000 ug | INTRAMUSCULAR | Status: DC | PRN

## 2020-10-16 MED ORDER — BUPIVACAINE HCL (PF) 0.5 % IJ SOLN
INTRAMUSCULAR | Status: DC | PRN
  Administered 2020-10-16: 10 mL

## 2020-10-16 MED ORDER — OXYCODONE HCL 5 MG/5ML PO SOLN: 5.0000 mg | Freq: Once | ORAL | Status: DC | PRN

## 2020-10-16 MED ORDER — SCOPOLAMINE 1 MG/3DAYS TD PT72
MEDICATED_PATCH | TRANSDERMAL | Status: DC | PRN
  Administered 2020-10-16: 1 via TRANSDERMAL

## 2020-10-16 MED ORDER — DEXMEDETOMIDINE (PRECEDEX) IN NS 20 MCG/5ML (4 MCG/ML) IV SYRINGE
PREFILLED_SYRINGE | INTRAVENOUS | Status: AC
  Filled 2020-10-16: qty 5

## 2020-10-16 MED ORDER — MIDAZOLAM HCL 5 MG/5ML IJ SOLN
INTRAMUSCULAR | Status: DC | PRN
  Administered 2020-10-16: 2 mg via INTRAVENOUS

## 2020-10-16 MED ORDER — ONDANSETRON HCL 4 MG/2ML IJ SOLN
INTRAMUSCULAR | Status: AC
  Filled 2020-10-16: qty 2

## 2020-10-16 MED ORDER — MEPERIDINE HCL 25 MG/ML IJ SOLN: 6.2500 mg | INTRAMUSCULAR | Status: DC | PRN

## 2020-10-16 MED ORDER — FENTANYL CITRATE (PF) 100 MCG/2ML IJ SOLN
INTRAMUSCULAR | Status: DC | PRN
  Administered 2020-10-16 (×2): 50 ug via INTRAVENOUS

## 2020-10-16 MED ORDER — OXYCODONE-ACETAMINOPHEN 10-325 MG PO TABS: 1.0000 | ORAL_TABLET | ORAL | 0 refills | Status: DC | PRN

## 2020-10-16 MED ORDER — PROPOFOL 500 MG/50ML IV EMUL
INTRAVENOUS | Status: DC | PRN
  Administered 2020-10-16: 125 ug/kg/min via INTRAVENOUS

## 2020-10-16 MED ORDER — BACITRACIN ZINC 500 UNIT/GM EX OINT
TOPICAL_OINTMENT | CUTANEOUS | Status: DC | PRN
  Administered 2020-10-16: 1 via TOPICAL

## 2020-10-16 MED ORDER — FENTANYL CITRATE (PF) 100 MCG/2ML IJ SOLN
INTRAMUSCULAR | Status: AC
  Filled 2020-10-16: qty 2

## 2020-10-16 MED ORDER — ACETAMINOPHEN 325 MG PO TABS: 325.0000 mg | ORAL_TABLET | ORAL | Status: DC | PRN

## 2020-10-16 MED ORDER — LACTATED RINGERS IV SOLN: INTRAVENOUS | Status: DC

## 2020-10-16 MED ORDER — PHENOL 89 % LIQD
Status: DC | PRN
  Administered 2020-10-16: 2 via TOPICAL

## 2020-10-16 MED ORDER — ONDANSETRON HCL 4 MG/2ML IJ SOLN: 4.0000 mg | Freq: Once | INTRAMUSCULAR | Status: DC | PRN

## 2020-10-16 MED ORDER — LIDOCAINE HCL 2 % IJ SOLN
INTRAMUSCULAR | Status: DC | PRN
  Administered 2020-10-16: 10 mL

## 2020-10-16 MED ORDER — SCOPOLAMINE 1 MG/3DAYS TD PT72
MEDICATED_PATCH | TRANSDERMAL | Status: AC
  Filled 2020-10-16: qty 1

## 2020-10-16 MED ORDER — ACETAMINOPHEN 160 MG/5ML PO SOLN: 325.0000 mg | ORAL | Status: DC | PRN

## 2020-10-16 MED ORDER — PROPOFOL 10 MG/ML IV BOLUS
INTRAVENOUS | Status: DC | PRN
Start: 2020-10-16 — End: 2020-10-16
  Administered 2020-10-16 (×3): 20 mg via INTRAVENOUS
  Administered 2020-10-16: 50 mg via INTRAVENOUS

## 2020-10-16 MED ORDER — CEFAZOLIN SODIUM-DEXTROSE 2-4 GM/100ML-% IV SOLN
2.0000 g | Freq: Once | INTRAVENOUS | Status: AC
  Administered 2020-10-16: 2 g via INTRAVENOUS

## 2020-10-16 MED ORDER — CEFAZOLIN SODIUM-DEXTROSE 2-4 GM/100ML-% IV SOLN: 2.0000 g | Freq: Once | INTRAVENOUS | Status: DC

## 2020-10-16 MED ORDER — DEXMEDETOMIDINE (PRECEDEX) IN NS 20 MCG/5ML (4 MCG/ML) IV SYRINGE
PREFILLED_SYRINGE | INTRAVENOUS | Status: DC | PRN
  Administered 2020-10-16 (×2): 4 ug via INTRAVENOUS
  Administered 2020-10-16: 8 ug via INTRAVENOUS
  Administered 2020-10-16: 4 ug via INTRAVENOUS

## 2020-10-16 MED ORDER — ONDANSETRON HCL 4 MG/2ML IJ SOLN
INTRAMUSCULAR | Status: DC | PRN
  Administered 2020-10-16: 4 mg via INTRAVENOUS

## 2020-10-16 MED ORDER — OXYCODONE HCL 5 MG PO TABS: 5.0000 mg | ORAL_TABLET | Freq: Once | ORAL | Status: DC | PRN

## 2020-10-16 SURGICAL SUPPLY — 46 items
APL PRP STRL LF DISP 70% ISPRP (MISCELLANEOUS) ×1
BLADE SURG 15 STRL LF DISP TIS (BLADE) ×2 IMPLANT
BLADE SURG 15 STRL SS (BLADE) ×4
BNDG CMPR 9X4 STRL LF SNTH (GAUZE/BANDAGES/DRESSINGS)
BNDG ELASTIC 3X5.8 VLCR STR LF (GAUZE/BANDAGES/DRESSINGS) IMPLANT
BNDG ELASTIC 4X5.8 VLCR STR LF (GAUZE/BANDAGES/DRESSINGS) ×2 IMPLANT
BNDG ESMARK 4X9 LF (GAUZE/BANDAGES/DRESSINGS) IMPLANT
BNDG GAUZE ELAST 4 BULKY (GAUZE/BANDAGES/DRESSINGS) ×4 IMPLANT
CHLORAPREP W/TINT 26 (MISCELLANEOUS) ×2 IMPLANT
COVER BACK TABLE 60X90IN (DRAPES) ×2 IMPLANT
COVER WAND RF STERILE (DRAPES) IMPLANT
CUFF TOURN SGL QUICK 34 (TOURNIQUET CUFF)
CUFF TRNQT CYL 34X4.125X (TOURNIQUET CUFF) IMPLANT
DRAPE EXTREMITY T 121X128X90 (DISPOSABLE) ×2 IMPLANT
DRAPE IMP U-DRAPE 54X76 (DRAPES) ×2 IMPLANT
DRAPE SURG 17X23 STRL (DRAPES) IMPLANT
DRAPE U-SHAPE 47X51 STRL (DRAPES) ×2 IMPLANT
DRSG EMULSION OIL 3X3 NADH (GAUZE/BANDAGES/DRESSINGS) ×2 IMPLANT
ELECT REM PT RETURN 9FT ADLT (ELECTROSURGICAL) ×2
ELECTRODE REM PT RTRN 9FT ADLT (ELECTROSURGICAL) ×1 IMPLANT
GAUZE 4X4 16PLY RFD (DISPOSABLE) IMPLANT
GAUZE SPONGE 4X4 12PLY STRL (GAUZE/BANDAGES/DRESSINGS) ×2 IMPLANT
GLOVE SURG ENC MOIS LTX SZ7 (GLOVE) ×2 IMPLANT
GLOVE SURG UNDER POLY LF SZ7.5 (GLOVE) ×2 IMPLANT
GOWN STRL REUS W/TWL LRG LVL3 (GOWN DISPOSABLE) ×4 IMPLANT
KIT TURNOVER CYSTO (KITS) ×2 IMPLANT
NDL HYPO 25X1 1.5 SAFETY (NEEDLE) ×1 IMPLANT
NDL SAFETY ECLIPSE 18X1.5 (NEEDLE) IMPLANT
NEEDLE HYPO 18GX1.5 SHARP (NEEDLE)
NEEDLE HYPO 25X1 1.5 SAFETY (NEEDLE) ×2 IMPLANT
NS IRRIG 1000ML POUR BTL (IV SOLUTION) IMPLANT
PACK BASIN DAY SURGERY FS (CUSTOM PROCEDURE TRAY) ×2 IMPLANT
PADDING CAST ABS 4INX4YD NS (CAST SUPPLIES) ×2
PADDING CAST ABS COTTON 4X4 ST (CAST SUPPLIES) ×2 IMPLANT
PENCIL SMOKE EVACUATOR (MISCELLANEOUS) ×2 IMPLANT
STAPLER VISISTAT 35W (STAPLE) IMPLANT
STOCKINETTE 6  STRL (DRAPES) ×2
STOCKINETTE 6 STRL (DRAPES) ×1 IMPLANT
SUT MNCRL AB 3-0 PS2 18 (SUTURE) ×1 IMPLANT
SUT MNCRL AB 4-0 PS2 18 (SUTURE) ×1 IMPLANT
SUT MON AB 5-0 PS2 18 (SUTURE) ×1 IMPLANT
SUT PROLENE 4 0 PS 2 18 (SUTURE) IMPLANT
SUT VIC AB 4-0 P2 18 (SUTURE) IMPLANT
SYR BULB EAR ULCER 3OZ GRN STR (SYRINGE) ×2 IMPLANT
SYR CONTROL 10ML LL (SYRINGE) IMPLANT
UNDERPAD 30X36 HEAVY ABSORB (UNDERPADS AND DIAPERS) ×2 IMPLANT

## 2020-10-16 NOTE — Anesthesia Postprocedure Evaluation (Signed)
Anesthesia Post Note  Patient: Christopher Wyatt.  Procedure(s) Performed: EXCISION MORTON'S NEUROMA,AND REMOVAL OF INGROWN NAIL-GREAT TOES LEFT (Foot)     Patient location during evaluation: PACU Anesthesia Type: General Level of consciousness: awake and alert Pain management: pain level controlled Vital Signs Assessment: post-procedure vital signs reviewed and stable Respiratory status: spontaneous breathing, nonlabored ventilation, respiratory function stable and patient connected to nasal cannula oxygen Cardiovascular status: blood pressure returned to baseline and stable Postop Assessment: no apparent nausea or vomiting Anesthetic complications: no   No complications documented.  Last Vitals:  Vitals:   10/16/20 1400 10/16/20 1442  BP: 97/71 111/85  Pulse: 79 81  Resp: 17 18  Temp: (!) 36.4 C 36.7 C  SpO2: 92% 95%    Last Pain:  Vitals:   10/16/20 1430  TempSrc:   PainSc: 0-No pain                 Dyneshia Baccam

## 2020-10-16 NOTE — Transfer of Care (Signed)
Immediate Anesthesia Transfer of Care Note  Patient: Christopher Wyatt.  Procedure(s) Performed: EXCISION MORTON'S NEUROMA,AND REMOVAL OF INGROWN NAIL-GREAT TOES LEFT (Foot)  Patient Location: PACU  Anesthesia Type:MAC  Level of Consciousness: drowsy  Airway & Oxygen Therapy: Patient Spontanous Breathing and Patient connected to face mask oxygen  Post-op Assessment: Report given to RN and Post -op Vital signs reviewed and stable  Post vital signs: Reviewed and stable  Last Vitals:  Vitals Value Taken Time  BP 98/59 10/16/20 1330  Temp    Pulse 82 10/16/20 1331  Resp 17 10/16/20 1331  SpO2 96 % 10/16/20 1331  Vitals shown include unvalidated device data.  Last Pain:  Vitals:   10/16/20 1057  TempSrc: Oral         Complications: No complications documented.

## 2020-10-16 NOTE — Interval H&P Note (Signed)
History and Physical Interval Note:  10/16/2020 12:33 PM  Christopher A Sartin Jr.  has presented today for surgery, with the diagnosis of NEUROMA LEFT FOOT.  The various methods of treatment have been discussed with the patient and family. After consideration of risks, benefits and other options for treatment, the patient has consented to  Procedure(s): EXCISION MORTON'S [place] NAIL-GREAT TOES BILATERAL (Left) as a surgical intervention.  The patient's history has been reviewed, patient examined, no change in status, stable for surgery.  I have reviewed the patient's chart and labs.  Questions were answered to the patient's satisfaction.    Informed surgical risk consent was reviewed and read aloud to the patient.  I reviewed the films.  I have discussed my findings with the patient in great detail.  I have discussed all risks including but not limited to infection, stiffness, scarring, limp, disability, deformity, damage to blood vessels and nerves, numbness, poor healing, need for braces, arthritis, chronic pain, amputation, death.  All benefits and realistic expectations discussed in great detail.  I have made no promises as to the outcome.  I have provided realistic expectations.  I have offered the patient a 2nd opinion, which they have declined and assured me they preferred to proceed despite the risks    Felipa Furnace

## 2020-10-16 NOTE — Anesthesia Preprocedure Evaluation (Addendum)
Anesthesia Evaluation  Patient identified by MRN, date of birth, ID band Patient awake    Reviewed: Allergy & Precautions, H&P , NPO status , Patient's Chart, lab work & pertinent test results, reviewed documented beta blocker date and time   Airway Mallampati: II  TM Distance: >3 FB Neck ROM: full    Dental no notable dental hx. (+) Teeth Intact   Pulmonary neg pulmonary ROS, Current Smoker and Patient abstained from smoking.,    Pulmonary exam normal breath sounds clear to auscultation       Cardiovascular Exercise Tolerance: Good negative cardio ROS   Rhythm:regular Rate:Normal     Neuro/Psych  Headaches, PSYCHIATRIC DISORDERS Anxiety Depression  Neuromuscular disease    GI/Hepatic GERD  Medicated,(+)     substance abuse  alcohol use,   Endo/Other  negative endocrine ROS  Renal/GU Renal disease  negative genitourinary   Musculoskeletal   Abdominal   Peds  Hematology negative hematology ROS (+)   Anesthesia Other Findings   Reproductive/Obstetrics negative OB ROS                           Anesthesia Physical  Anesthesia Plan  ASA: III  Anesthesia Plan: MAC   Post-op Pain Management:    Induction:   PONV Risk Score and Plan: 1 and Propofol infusion  Airway Management Planned: Natural Airway and Nasal Cannula  Additional Equipment:   Intra-op Plan:   Post-operative Plan:   Informed Consent: I have reviewed the patients History and Physical, chart, labs and discussed the procedure including the risks, benefits and alternatives for the proposed anesthesia with the patient or authorized representative who has indicated his/her understanding and acceptance.     Dental Advisory Given  Plan Discussed with: CRNA and Anesthesiologist  Anesthesia Plan Comments:        Anesthesia Quick Evaluation

## 2020-10-16 NOTE — Discharge Instructions (Signed)
After Surgery Instructions   1) If you are recuperating from surgery anywhere other than home, please be sure to leave [place] the number where you can be reached.  2) Go directly home and rest.  3) Keep the operated foot(feet) elevated six inches above the hip when sitting or lying down. This will help control swelling and pain.  4) Support the elevated foot and leg with pillows. DO NOT PLACE PILLOWS UNDER THE KNEE.  5) DO NOT REMOVE or get your bandages WET, unless you were given different instructions by your doctor to do so. This increases the risk of infection.  6) Wear your surgical shoe or surgical boot at all times when you are up on your feet.  7) A limited amount of pain and swelling may occur. The skin may take on a bruised appearance. DO NOT BE ALARMED, THIS IS NORMAL.  8) For slight pain and swelling, apply an ice pack directly over the bandages for 15 minutes only out of each hour of the day. Continue until seen in the office for your first post op visit. DON NOT     APPLY ANY FORM OF HEAT TO THE AREA.  9) Have prescriptions filled immediately and take as directed.  10) Drink lots of liquids, water and juice to stay hydrated.  11) CALL IMMEDIATELY IF:  *Bleeding continues until the following day of surgery  *Pain increases and/or does not respond to medication  *Bandages or cast appears to tight  *If your bandage gets wet  *Trip, fall or stump your surgical foot  *If your temperature goes above 101  *If you have ANY questions at all  Ider. ADHERING TO THESE INSTRUCTIONS WILL OFFER YOU THE MOST COMPLETE RESULTS    Post Anesthesia Home Care Instructions  Activity: Get plenty of rest for the remainder of the day. A responsible individual must stay with you for 24 hours following the procedure.  For the next 24 hours, DO NOT: -Drive a car -Paediatric nurse -Drink alcoholic beverages -Take any medication unless instructed by your  physician -Make any legal decisions or sign important papers.  Meals: Start with liquid foods such as gelatin or soup. Progress to regular foods as tolerated. Avoid greasy, spicy, heavy foods. If nausea and/or vomiting occur, drink only clear liquids until the nausea and/or vomiting subsides. Call your physician if vomiting continues.  Special Instructions/Symptoms: Your throat may feel dry or sore from the anesthesia or the breathing tube placed in your throat during surgery. If this causes discomfort, gargle with warm salt water. The discomfort should disappear within 24 hours.  If you had a scopolamine patch placed behind your ear for the management of post- operative nausea and/or vomiting:  1. The medication in the patch is effective for 72 hours, after which it should be removed.  Wrap patch in a tissue and discard in the trash. Wash hands thoroughly with soap and water. 2. You may remove the patch earlier than 72 hours if you experience unpleasant side effects which may include dry mouth, dizziness or visual disturbances. 3. Avoid touching the patch. Wash your hands with soap and water after contact with the patch. Remove patch by Thursday, February 10th.

## 2020-10-17 ENCOUNTER — Telehealth: Payer: Self-pay | Admitting: *Deleted

## 2020-10-17 ENCOUNTER — Telehealth: Payer: Self-pay | Admitting: Podiatry

## 2020-10-17 ENCOUNTER — Encounter (HOSPITAL_BASED_OUTPATIENT_CLINIC_OR_DEPARTMENT_OTHER): Payer: Self-pay | Admitting: Podiatry

## 2020-10-17 LAB — SURGICAL PATHOLOGY

## 2020-10-17 MED ORDER — HYDROCODONE-ACETAMINOPHEN 10-325 MG PO TABS
1.0000 | ORAL_TABLET | Freq: Four times a day (QID) | ORAL | 0 refills | Status: DC | PRN
Start: 2020-10-17 — End: 2020-11-02

## 2020-10-17 NOTE — Telephone Encounter (Signed)
Patient has experienced bad reaction to pain meds that were prescribed after surgery and has requested different pain med to help cope, Please Advise

## 2020-10-17 NOTE — Telephone Encounter (Signed)
Returned call to patient, left voice mail that new script has been sent in for Vicodin and to take Benadryl for the itching.  If symptoms persist or get worse to call office back

## 2020-10-17 NOTE — Telephone Encounter (Signed)
I sent new medication earlier today

## 2020-10-17 NOTE — Telephone Encounter (Signed)
Please advise 

## 2020-10-17 NOTE — Addendum Note (Signed)
Addendum  created 10/17/20 1015 by Janeece Riggers, MD   Clinical Note Signed

## 2020-10-17 NOTE — Telephone Encounter (Signed)
Patient is unable to pick up new prescription(Hydrocodone ace-10-325mg  Norco ) from pharmacy until Feb.12th since he just received one yesterday(oxycodone-ace 10-325mg -Percocet).  He wanted to also let the doctor know that he has been taking Bendryl along with the Oxycodone for the itching but has now discontinued.He has also unwrapped his foot and is elevating because wrapping was too tight. Please advise.

## 2020-10-17 NOTE — Telephone Encounter (Signed)
I sent Norco/hydrocodone to the pharmacy see if he can tolerate that better

## 2020-10-17 NOTE — Telephone Encounter (Signed)
Sounds good thank you for letting me know.

## 2020-10-17 NOTE — Telephone Encounter (Signed)
Patient had surgery 1 day ago, prescribed pain medicine(oxycodone-ace 10-325mg ) which is not helping with pain but also causing itching when taken. Please advise.

## 2020-10-17 NOTE — Telephone Encounter (Signed)
Patient is calling again and is having itching all body from taking pain medicine and he needs a stronger medicine as well. Please call.

## 2020-10-18 ENCOUNTER — Other Ambulatory Visit: Payer: Self-pay

## 2020-10-18 ENCOUNTER — Telehealth: Payer: Medicaid Other | Admitting: Psychiatry

## 2020-10-18 ENCOUNTER — Telehealth: Payer: Self-pay | Admitting: Psychiatry

## 2020-10-18 ENCOUNTER — Encounter: Payer: Medicaid Other | Admitting: Podiatry

## 2020-10-18 NOTE — Op Note (Signed)
Surgeon: Surgeon(s): Felipa Furnace, DPM  Assistants: None Pre-operative diagnosis: NEUROMA LEFT FOOT  Post-operative diagnosis: same Procedure: Procedure(s): EXCISION MORTON'S NEUROMA,AND REMOVAL OF INGROWN NAIL-GREAT TOES LEFT  Pathology:  ID Type Source Tests Collected by Time Destination  1 : left foot neuroma Tissue PATH Other SURGICAL PATHOLOGY Felipa Furnace, DPM 10/16/2020 1306     Pertinent Intra-op findings: Severe Morton's neuroma noted to the left third interspace Anesthesia: Choice  Hemostasis:  Total Tourniquet Time Documented: Calf (Left) - 19 minutes Total: Calf (Left) - 19 minutes  EBL: 2 mL  Materials: 3-0 Prolene Injectables: None Complications: None  Indications for surgery: A 45 y.o. male presents with left third interspace more neuroma and ingrown nail of the left hallux bilateral border painful both. Patient has failed all conservative therapy including but not limited to orthotics, injections, debridement of the nail. He wishes to have surgical correction of the foot/deformity. It was determined that patient would benefit from left excision of Morton's neuroma and removal/excision of ingrown bilateral borders of the left hallux with matricectomy. Informed surgical risk consent was reviewed and read aloud to the patient.  I reviewed the films.  I have discussed my findings with the patient in great detail.  I have discussed all risks including but not limited to infection, stiffness, scarring, limp, disability, deformity, damage to blood vessels and nerves, numbness, poor healing, need for braces, arthritis, chronic pain, amputation, death.  All benefits and realistic expectations discussed in great detail.  I have made no promises as to the outcome.  I have provided realistic expectations.  I have offered the patient a 2nd opinion, which they have declined and assured me they preferred to proceed despite the risks   Procedure in detail: The patient was both verbally  and visually identified by myself, the nursing staff, and anesthesia staff in the preoperative holding area. They were then transferred to the operating room and placed on the operative table in supine position.  Attention was directed to the left foot: Skin marker was used to delineate a longitudinal incision in the left third interspace.  The incision was carried down by 15 blade down to level of epidermis dermal layer followed by down to the level of the deep transverse intermetatarsal ligament.  Using Metzenbaums the deep transverse intermetatarsal ligament was transected at this time neuroma was noted.  The neuroma appeared to be very fibrosed and thickened.  The neuroma was excised out in its entirety.  The proximal margin of the nerve was buried within the interosseous muscle belly.  The incision was irrigated with normal sterile saline solution.  Deeper layers were closed with 3-0 Monocryl followed by skin closure with 3-0 Prolene in simple interrupted suture technique.  The incision was dressed with Xeroform Kerlix Ace bandage all bony prominences were padded.  Tourniquet was deflated at total time of 15 minutes.  Cap refill was noted to all digits   Procedure: Excision of Ingrown Toenail Location: Left 1st toe Medial lateral border nail borders. Anesthesia: Lidocaine 1% plain; 1.5 mL and Marcaine 0.5% plain; 1.5 mL, digital block. Skin Prep: Betadine. Dressing: Silvadene; telfa; dry, sterile, compression dressing. Technique: Following skin prep, the toe was exsanguinated and a tourniquet was secured at the base of the toe. The affected nail border was freed, split with a nail splitter, and excised. Chemical matrixectomy was then performed with phenol and irrigated out with alcohol. The tourniquet was then removed and sterile dressing applied. Disposition: Patient tolerated procedure well.  No follow-ups on file.   At the conclusion of the procedure the patient was awoken from anesthesia  and found to have tolerated the procedure well any complications. There were transferred to PACU with vital signs stable and vascular status intact.  Boneta Lucks, DPM

## 2020-10-18 NOTE — Telephone Encounter (Signed)
Sent link for video visit through Epic. Patient did not sign in. Called the patient  for appointment scheduled today. The patient did not answer the phone. Left voice message to contact the office.  

## 2020-10-19 ENCOUNTER — Telehealth: Payer: Self-pay | Admitting: Podiatry

## 2020-10-19 NOTE — Telephone Encounter (Signed)
Patient would like a call back. He has some questions regarding his surgery. He mostly had questions about his stitches and being able to take a shower.

## 2020-10-20 NOTE — Telephone Encounter (Signed)
He does not need to shower or change the bandage until he sees me

## 2020-10-20 NOTE — Telephone Encounter (Signed)
Patient said he has changed his bandage several times already and he showered last night. He took the bandage off for the shower and wrapped his foot up with a [employer] plastic bag and taped it up. A little moisture got in the bag and he patted his foot well with a towel and bandaged it back up. Foot is itching around the stitches. He says he is doing well.    He says he has a little blister beside his stitches and he was wondering if that is normal.

## 2020-10-20 NOTE — Telephone Encounter (Signed)
You can reiterate to try to keep his bandages and foot as dry as possible.  A small blister can be fine especially if it is happening from bandages however make sure the blister is not large as there might be too much friction between the bandage and the surgical site.

## 2020-10-25 ENCOUNTER — Telehealth: Payer: Self-pay | Admitting: *Deleted

## 2020-10-25 ENCOUNTER — Encounter: Payer: Medicaid Other | Admitting: Podiatry

## 2020-10-25 ENCOUNTER — Encounter: Payer: Self-pay | Admitting: Podiatry

## 2020-10-25 ENCOUNTER — Other Ambulatory Visit: Payer: Self-pay

## 2020-10-25 ENCOUNTER — Ambulatory Visit (INDEPENDENT_AMBULATORY_CARE_PROVIDER_SITE_OTHER): Payer: Medicaid Other | Admitting: Podiatry

## 2020-10-25 DIAGNOSIS — G5762 Lesion of plantar nerve, left lower limb: Secondary | ICD-10-CM

## 2020-10-25 MED ORDER — DOXYCYCLINE HYCLATE 100 MG PO TABS: 100.0000 mg | ORAL_TABLET | Freq: Two times a day (BID) | ORAL | 0 refills | Status: DC

## 2020-10-25 NOTE — Progress Notes (Signed)
  Subjective:  Patient ID: Christopher Ahr., male    DOB: Mar 19, 1976,  MRN: [id number]  Chief Complaint  Patient presents with  . Routine Post Op    POV #1 DOS 10/16/2020 LT 3RD INTERSPACE NEUROMA EXCISION, EXC NAIL PERM. Pt. Has no complaints. Pt. Still has some soreness and edema.       45 y.o. male returns for post-op check.  Agree with the above.  There are some redness around the incision site likely due to noncompliance and showering his foot with an incision not healed.  Review of Systems: Negative except as noted in the HPI. Denies N/V/F/Ch.  Past Medical History:  Diagnosis Date  . Anxiety    Chronic  . GERD (gastroesophageal reflux disease)   . Headache   . Neuroma   . OCD (obsessive compulsive disorder)     Current Outpatient Medications:  .  doxycycline (VIBRA-TABS) 100 MG tablet, Take 1 tablet (100 mg total) by mouth 2 (two) times daily., Disp: 20 tablet, Rfl: 0 .  calcium carbonate (TUMS - DOSED IN MG ELEMENTAL CALCIUM) 500 MG chewable tablet, Chew 1-2 tablets by mouth as needed for indigestion or heartburn. , Disp: , Rfl:  .  dexlansoprazole (DEXILANT) 60 MG capsule, Take 1 capsule (60 mg total) by mouth daily., Disp: 30 capsule, Rfl: 3 .  HYDROcodone-acetaminophen (NORCO) 10-325 MG tablet, Take 1 tablet by mouth every 6 (six) hours as needed., Disp: 30 tablet, Rfl: 0 .  oxyCODONE-acetaminophen (PERCOCET) 10-325 MG tablet, Take 1 tablet by mouth every 4 (four) hours as needed for pain., Disp: 30 tablet, Rfl: 0 .  sertraline (ZOLOFT) 100 MG tablet, Take 1 tablet (100 mg total) by mouth daily., Disp: 30 tablet, Rfl: 1 .  sucralfate (CARAFATE) 1 g tablet, Take 1 tablet (1 g total) by mouth 4 (four) times daily as needed., Disp: 120 tablet, Rfl: 2  Current Facility-Administered Medications:  .  nicotine polacrilex (NICORETTE) gum 2 mg, 2 mg, Oral, PRN, Lindell Spar, MD  Social History   Tobacco Use  Smoking Status Current Every Day Smoker  . Packs/day: 0.50   . Years: 4.00  . Pack years: 2.00  . Types: Cigarettes  Smokeless Tobacco Former Systems developer  . Types: Snuff    Allergies  Allergen Reactions  . Paroxetine Hcl Other (See Comments)    Erectile dysfunction   Objective:  There were no vitals filed for this visit. There is no height or weight on file to calculate BMI. Constitutional Well developed. Well nourished.  Vascular Foot warm and well perfused. Capillary refill normal to all digits.   Neurologic Normal speech. Oriented to person, place, and time. Epicritic sensation to light touch grossly present bilaterally.  Dermatologic Skin healing well without signs of infection. Skin edges well coapted without signs of infection.  Mild erythema noted circumferentially  Orthopedic: Tenderness to palpation noted about the surgical site.   Radiographs: None Assessment:   1. Morton's neuroma of left foot    Plan:  Patient was evaluated and treated and all questions answered.  S/p foot surgery left -Progressing as expected post-operatively. -XR: None -WB Status: Weightbearing as tolerated in surgical shoe -Sutures: Intact.  No signs of dehiscence mild erythema noted circumferentially -Medications: None -Doxy was sent to pharmacy for skin and soft tissue prophylaxis -Foot redressed.  No follow-ups on file.

## 2020-10-25 NOTE — Telephone Encounter (Signed)
Patient is requesting status of 10 day supply of antibiotics that was supposed to be sent to pharmacy,called and it is not there. Please advise.  Returned call to patient and informed that medication has been sent to pharmacy on file and to call them back to recheck. Verbalized understanding. Called pharmacy to verify that medication had been sent.

## 2020-10-31 ENCOUNTER — Telehealth: Payer: Self-pay | Admitting: Podiatry

## 2020-10-31 NOTE — Telephone Encounter (Signed)
Pt called requesting a sooner post op appt with Dr. Posey Pronto. I left him a vm stating Dr. Posey Pronto is out of the office until the day of his appt, but I could get him in with another provider if needed.

## 2020-11-02 ENCOUNTER — Other Ambulatory Visit: Payer: Self-pay

## 2020-11-02 ENCOUNTER — Encounter: Payer: Self-pay | Admitting: Nurse Practitioner

## 2020-11-02 ENCOUNTER — Ambulatory Visit: Payer: Medicaid Other | Admitting: Nurse Practitioner

## 2020-11-02 VITALS — BP 116/78 | HR 75 | Temp 97.1°F | Ht 72.0 in | Wt 189.4 lb

## 2020-11-02 DIAGNOSIS — K2210 Ulcer of esophagus without bleeding: Secondary | ICD-10-CM | POA: Diagnosis not present

## 2020-11-02 DIAGNOSIS — R197 Diarrhea, unspecified: Secondary | ICD-10-CM

## 2020-11-02 DIAGNOSIS — K2100 Gastro-esophageal reflux disease with esophagitis, without bleeding: Secondary | ICD-10-CM | POA: Diagnosis not present

## 2020-11-02 NOTE — Progress Notes (Signed)
Cc'ed to pcp °

## 2020-11-02 NOTE — Progress Notes (Signed)
Referring Provider: Lindell Spar, MD Primary Care Physician:  Lindell Spar, MD Primary GI:  Dr. Gala Romney  Chief Complaint  Patient presents with  . Gastroesophageal Reflux    Doing better on Dexilant    HPI:   Christopher Wyatt. is a 45 y.o. male who presents for follow-up on GERD and diarrhea.  The patient was last seen in our office 08/01/2020 for the same.  History of chronic GERD failed OTC medications.  Previously tried and failed omeprazole 40 mg.  Negative H. pylori testing on file.  At his last visit he noted worsening GERD symptoms on Protonix twice daily, hebetate 18 times today before his visit.  Strongly advised to follow instructions on bottle of Tums to not overdose.  Typical symptoms include esophageal burning, stomach pain.  No further NSAIDs or aspirin powders.  At that time he also noted a change in stools from typical Bristol 4 stools to loose stools consistently Bristol 6-7 for the previous 2 months, 1 stool a day.  Seems to have been since he started Zoloft and he is trying to get scheduled with his psych provider to discuss changing his medication.  No other overt GI complaints.  Recommended stop Protonix and start Dexilant 60 mg daily, progress for 1 to 2 weeks, Carafate 4 times daily as needed, discussed diarrhea with psych provider for possible medication change, EGD with propofol, follow-up in 3 months.  EGD was completed 08/10/2020 which found severe ulcerative/erosive reflux esophagitis without Barrett's epithelium, medium size hiatal hernia, otherwise normal.  Recommended continue Dexilant, use Carafate slurry before meals and nightly, reduce alcohol, tobacco, carbonated beverages.  Repeat EGD in 1 year.  Consider antireflux surgery.  Follow-up in office in 3 months.  He is currently on the recall list for repeat EGD.  Today states he is doing significantly better.  Dexilant seems to be working well for him. Has only required TUMS once since starting  Dexilant because he forgot to take his medication that day. Denies any further esophageal burning, abdominal pain, N/V, hematochezia, melena. Diarrhea has resolved as well. Denies URI or flu-like symptoms. Denies loss of sense of taste or smell. The patient has received COVID-19 vaccination(s). Denies chest pain, dyspnea, dizziness, lightheadedness, syncope, near syncope. Denies any other upper or lower GI symptoms.  Past Medical History:  Diagnosis Date  . Anxiety    Chronic  . GERD (gastroesophageal reflux disease)   . Headache   . Neuroma   . OCD (obsessive compulsive disorder)     Past Surgical History:  Procedure Laterality Date  . CIRCUMCISION  2004  . ESOPHAGOGASTRODUODENOSCOPY (EGD) WITH PROPOFOL N/A 08/10/2020   Procedure: ESOPHAGOGASTRODUODENOSCOPY (EGD) WITH PROPOFOL;  Surgeon: Daneil Dolin, MD;  [facility]: [facility];  Service: Endoscopy;  Laterality: N/A;  1:45pm  . EXCISION MORTON'S NEUROMA  10/16/2020   Procedure: EXCISION MORTON'S [place] OF INGROWN NAIL-GREAT TOES LEFT;  Surgeon: Felipa Furnace, DPM;  Location: [facility];  Service: Podiatry;;  . WISDOM TOOTH EXTRACTION  2011    Current Outpatient Medications  Medication Sig Dispense Refill  . calcium carbonate (TUMS - DOSED IN MG ELEMENTAL CALCIUM) 500 MG chewable tablet Chew 1-2 tablets by mouth as needed for indigestion or heartburn.     . dexlansoprazole (DEXILANT) 60 MG capsule Take 1 capsule (60 mg total) by mouth daily. 30 capsule 3  . doxycycline (VIBRA-TABS) 100 MG tablet Take 1 tablet (100 mg total) by mouth 2 (two) times  daily. 20 tablet 0  . oxyCODONE-acetaminophen (PERCOCET) 10-325 MG tablet Take 1 tablet by mouth every 4 (four) hours as needed for pain. 30 tablet 0  . sertraline (ZOLOFT) 100 MG tablet Take 1 tablet (100 mg total) by mouth daily. 30 tablet 1   Current Facility-Administered Medications  Medication Dose Route Frequency Provider Last Rate Last Admin  . nicotine  polacrilex (NICORETTE) gum 2 mg  2 mg Oral PRN Lindell Spar, MD        Allergies as of 11/02/2020 - Review Complete 11/02/2020  Allergen Reaction Noted  . Paroxetine hcl Other (See Comments) 01/09/2011    Family History  Problem Relation Age of Onset  . Depression Mother   . Anxiety disorder Mother   . Depression Sister   . Bipolar disorder Paternal Uncle   . Schizophrenia Paternal Uncle   . Alcohol abuse Maternal Uncle   . Hypertension Father   . Colon cancer Neg Hx     Social History   Socioeconomic History  . Marital status: Significant Other    Spouse name: Not on file  . Number of children: 1  . Years of education: 70  . Highest education level: High school graduate  Occupational History  . Occupation: local truck driver  Tobacco Use  . Smoking status: Current Every Day Smoker    Packs/day: 0.50    Years: 4.00    Pack years: 2.00    Types: Cigarettes  . Smokeless tobacco: Former Systems developer    Types: Snuff  Vaping Use  . Vaping Use: Never used  Substance and Sexual Activity  . Alcohol use: Yes    Comment: 1-2 glasses of wine  few x week  . Drug use: No  . Sexual activity: Yes  Other Topics Concern  . Not on file  Social History Narrative   Lives at home, along with his son and parents.   Left-sided.   1-3 cup coffee, occasional soda.   Social Determinants of Health   Financial Resource Strain: Not on file  Food Insecurity: Not on file  Transportation Needs: Not on file  Physical Activity: Not on file  Stress: Not on file  Social Connections: Not on file    Subjective: Review of Systems  Constitutional: Negative for chills, fever, malaise/fatigue and weight loss.  HENT: Negative for congestion and sore throat.   Respiratory: Negative for cough and shortness of breath.   Cardiovascular: Negative for chest pain and palpitations.  Gastrointestinal: Negative for abdominal pain, blood in stool, diarrhea, heartburn, melena, nausea and vomiting.   Musculoskeletal: Negative for joint pain and myalgias.  Skin: Negative for rash.  Neurological: Negative for dizziness and weakness.  Endo/Heme/Allergies: Does not bruise/bleed easily.  Psychiatric/Behavioral: Negative for depression. The patient is not nervous/anxious.   All other systems reviewed and are negative.    Objective: BP 116/78   Pulse 75   Temp (!) 97.1 F (36.2 C) (Temporal)   Ht 6' (1.829 m)   Wt 189 lb 6.4 oz (85.9 kg)   BMI 25.69 kg/m  Physical Exam Vitals and nursing note reviewed.  Constitutional:      General: He is not in acute distress.    Appearance: Normal appearance. He is normal weight. He is not ill-appearing, toxic-appearing or diaphoretic.  HENT:     Head: Normocephalic and atraumatic.     Nose: No congestion or rhinorrhea.  Eyes:     General: No scleral icterus. Cardiovascular:     Rate and Rhythm: Normal rate  and regular rhythm.     Heart sounds: Normal heart sounds.  Pulmonary:     Effort: Pulmonary effort is normal.     Breath sounds: Normal breath sounds.  Abdominal:     General: Bowel sounds are normal. There is no distension.     Palpations: Abdomen is soft. There is no hepatomegaly, splenomegaly or mass.     Tenderness: There is no abdominal tenderness. There is no guarding or rebound.     Hernia: No hernia is present.  Musculoskeletal:     Cervical back: Neck supple.  Skin:    General: Skin is warm and dry.     Coloration: Skin is not jaundiced.     Findings: No bruising or rash.  Neurological:     General: No focal deficit present.     Mental Status: He is alert and oriented to person, place, and time. Mental status is at baseline.  Psychiatric:        Mood and Affect: Mood normal.        Behavior: Behavior normal.        Thought Content: Thought content normal.      Assessment:  Very pleasant 45 year old male presents to follow-up on GERD, severe erosive esophagitis, diarrhea.  Clinically he is doing significantly  better.  No red flag/warning signs or symptoms.  GERD with esophagitis: Previously on omeprazole and Protonix.  He developed worsening GERD symptoms and was switched to Dexilant and scheduled for an EGD.  His EGD showed severe erosive reflux esophagitis.  Recommended continue Dexilant and repeat EGD in 1 year, for which he is on recall list.  He states Dexilant is working remarkably well.  He no longer has any symptoms.  He is very happy with his progress.  He will be getting a new job in a few weeks and shortly after that we will get established with commercial health insurance at which point we may need to work on prior [place] if they balk at [employer].  Diarrhea: He was having some diarrhea at his last visit that he felt was related to Zoloft.  He was going to discuss with his mental health provider about switching medications, which did not happen.  However, interestingly since his GERD symptoms have resolved on Dexilant his diarrhea has also resolved.  No further loose stools.  Recommend he continue to monitor notify us of any recurrence   Plan: 1. Continue Dexilant 60 mg daily 2. Tums sparingly for breakthrough 3. Continue other current medications 4. Notify us of any worsening or recurrent symptoms 5. Follow-up in 6 months    Thank you for allowing [place] to participate in the care of [employer].  Walden Field, DNP, AGNP-C Adult & Gerontological Nurse Practitioner [facility]   11/02/2020 4:07 PM   Disclaimer: This note was dictated with voice recognition software. Similar sounding words can inadvertently be transcribed and may not be corrected upon review.

## 2020-11-02 NOTE — Patient Instructions (Signed)
Your health issues we discussed today were:   GERD (reflux/heartburn) with erosive esophagitis (swallowing tube/esophagus severely eroded): 1. Doing better on Dexilant excavation point 2. Continue to take Dexilant once a day as you have been 3. You can use Tums as needed for breakthrough symptoms, but use sparingly 4. Call us for any worsening or severe symptoms, especially if they are lingering 5. As discussed, we will plan to repeat your endoscopy at the end of this year  Diarrhea: 1. I am glad you are feeling better! 2. Let us know if you have any recurrent diarrhea  Overall I recommend:  1. Continue other current medications 2. Return for follow-up in 6 months 3. Call us for any questions or concerns   ---------------------------------------------------------------  I am glad you have gotten your COVID-19 vaccination!  Even though you are fully vaccinated you should continue to follow CDC and state/local guidelines.  ---------------------------------------------------------------   At [facility] we value your feedback. You may receive a survey about your visit today. Please share your experience as we strive to create trusting relationships with our patients to provide genuine, compassionate, quality care.  We appreciate your understanding and patience as we review any laboratory studies, imaging, and other diagnostic tests that are ordered as we care for you. Our office policy is 5 business days for review of these results, and any emergent or urgent results are addressed in a timely manner for your best interest. If you do not hear from our office in 1 week, please contact us.   We also encourage the use of MyChart, which contains your medical information for your review as well. If you are not enrolled in this feature, an access code is on this after visit summary for your convenience. Thank you for allowing [place] to be involved in your care.  It was great to see  you today!  I hope you have a great end of winter and early spring!!

## 2020-11-03 ENCOUNTER — Encounter: Payer: Medicaid Other | Admitting: Podiatrist

## 2020-11-08 ENCOUNTER — Telehealth: Payer: Self-pay | Admitting: Podiatry

## 2020-11-08 ENCOUNTER — Encounter: Payer: Medicaid Other | Admitting: Podiatry

## 2020-11-08 NOTE — Telephone Encounter (Signed)
Patient called in regarding appointment today, Patient thought appointment was in [place] but actually in [place] at 130. Patient currently on the line waiting to see if [facility] could see patient in [place] today instead at 145, Please Advise

## 2020-11-09 ENCOUNTER — Encounter: Payer: Medicaid Other | Admitting: Podiatry

## 2020-11-09 ENCOUNTER — Telehealth: Payer: Self-pay | Admitting: Podiatry

## 2020-11-09 NOTE — Telephone Encounter (Signed)
Patient called and stated he has tested positive for Covid and will be unable to come in for his post op. Most of the stiches have fallen out . He would like Dr. Posey Pronto to give him a call sometime today to discuss whats next for him.

## 2020-11-09 NOTE — Telephone Encounter (Signed)
Already spoke with him

## 2020-11-09 NOTE — Telephone Encounter (Signed)
Please advise 

## 2020-12-25 ENCOUNTER — Encounter: Payer: Self-pay | Admitting: Gastroenterology

## 2020-12-27 ENCOUNTER — Encounter: Payer: Self-pay | Admitting: Psychiatry

## 2020-12-27 ENCOUNTER — Other Ambulatory Visit: Payer: Self-pay

## 2020-12-27 ENCOUNTER — Telehealth (INDEPENDENT_AMBULATORY_CARE_PROVIDER_SITE_OTHER): Payer: Medicaid Other | Admitting: Psychiatry

## 2020-12-27 DIAGNOSIS — F422 Mixed obsessional thoughts and acts: Secondary | ICD-10-CM

## 2020-12-27 DIAGNOSIS — F419 Anxiety disorder, unspecified: Secondary | ICD-10-CM

## 2020-12-27 MED ORDER — SERTRALINE HCL 100 MG PO TABS: ORAL_TABLET | ORAL | 0 refills | Status: DC

## 2020-12-27 NOTE — Progress Notes (Signed)
Virtual Visit via Video Note  I connected with Christopher Wyatt. on 12/27/20 at  3:00 PM EDT by a video enabled telemedicine application and verified that I am speaking with the correct person using two identifiers.  Location: Patient: car Provider: office Persons participated in the visit- patient, provider   I discussed the limitations of evaluation and management by telemedicine and the availability of in person appointments. The patient expressed understanding and agreed to proceed.   I discussed the assessment and treatment plan with the patient. The patient was provided an opportunity to ask questions and all were answered. The patient agreed with the plan and demonstrated an understanding of the instructions.   The patient was advised to call back or seek an in-person evaluation if the symptoms worsen or if the condition fails to improve as anticipated.  I provided 15 minutes of non-face-to-face time during this encounter.   Norman Clay, MD     [facility] MD/PA/NP OP Progress Note  12/27/2020 3:59 PM Keaton A Meda Coffee.  MRN:  [id number]  Chief Complaint:  Chief Complaint    Follow-up; Anxiety; Other     HPI:  This is a follow-up appointment for OCD and anxiety.  He states that although he used to be doing better when he used to be on sertraline, he has had stronger "tics "since running out of this medication for the past few months.  He tends to feel more anxious, and has issues with grabbing knobs.  He has decreased appetite as he thinks that food resemble some animals.  Only the meal he ate today is chicken sandwich.  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  He likes his job, and likes that he has income.  He feels blessed.  Although he does not have time to think about anxiety, he feels tense, and he does not know how to relax.  He thinks loose stool has got better after he limit alcohol intake since he got this new job.  He drinks wine every other day.   He denies feeling depressed or anhedonia.  He sleeps well except that he needs to wake up already for his job.  He has occasional panic attacks.  He is willing to try sertraline again.   Daily routine: Exercise: Employment: truck driver since 02/7671 Support: Household: son in college in 2022, girlfriend Marital status: divorced after 9 months of marriage Number of children: 1 son    Wt Readings from Last 3 Encounters:  11/02/20 189 lb 6.4 oz (85.9 kg)  10/16/20 186 lb 12.8 oz (84.7 kg)  09/20/20 189 lb (85.7 kg)     Visit Diagnosis:    ICD-10-CM   1. Mixed obsessional thoughts and acts  F42.2   2. Anxiety  F41.9     Past Psychiatric History: Please see initial evaluation for full details. I have reviewed the history. No updates at this time.     Past Medical History:  Past Medical History:  Diagnosis Date  . Anxiety    Chronic  . GERD (gastroesophageal reflux disease)   . Headache   . Neuroma   . OCD (obsessive compulsive disorder)     Past Surgical History:  Procedure Laterality Date  . CIRCUMCISION  2004  . ESOPHAGOGASTRODUODENOSCOPY (EGD) WITH PROPOFOL N/A 08/10/2020   Procedure: ESOPHAGOGASTRODUODENOSCOPY (EGD) WITH PROPOFOL;  Surgeon: Daneil Dolin, MD;  [facility];  Service: Endoscopy;  Laterality: N/A;  1:45pm  . EXCISION MORTON'S  NEUROMA  10/16/2020   Procedure: EXCISION MORTON'S NEUROMA,AND REMOVAL OF INGROWN NAIL-GREAT TOES LEFT;  Surgeon: Felipa Furnace, DPM;  Location: [facility];  Service: Podiatry;;  . WISDOM TOOTH EXTRACTION  2011    Family Psychiatric History: Please see initial evaluation for full details. I have reviewed the history. No updates at this time.     Family History:  Family History  Problem Relation Age of Onset  . Depression Mother   . Anxiety disorder Mother   . Depression Sister   . Bipolar disorder Paternal Uncle   . Schizophrenia Paternal Uncle   . Alcohol abuse Maternal Uncle   .  Hypertension Father   . Colon cancer Neg Hx     Social History:  Social History   Socioeconomic History  . Marital status: Significant Other    Spouse name: Not on file  . Number of children: 1  . Years of education: 41  . Highest education level: High school graduate  Occupational History  . Occupation: local truck driver  Tobacco Use  . Smoking status: Current Every Day Smoker    Packs/day: 0.50    Years: 4.00    Pack years: 2.00    Types: Cigarettes  . Smokeless tobacco: Former Systems developer    Types: Snuff  Vaping Use  . Vaping Use: Never used  Substance and Sexual Activity  . Alcohol use: Yes    Comment: 1-2 glasses of wine  few x week  . Drug use: No  . Sexual activity: Yes  Other Topics Concern  . Not on file  Social History Narrative   Lives at home, along with his son and parents.   Left-sided.   1-3 cup coffee, occasional soda.   Social Determinants of Health   Financial Resource Strain: Not on file  Food Insecurity: Not on file  Transportation Needs: Not on file  Physical Activity: Not on file  Stress: Not on file  Social Connections: Not on file    Allergies:  Allergies  Allergen Reactions  . Paroxetine Hcl Other (See Comments)    Erectile dysfunction    Metabolic Disorder Labs: Lab Results  Component Value Date   HGBA1C 5.2 12/03/2018   MPG 103 12/03/2018   No results found for: PROLACTIN Lab Results  Component Value Date   CHOL 137 03/26/2016   TRIG 111 03/26/2016   HDL 61 03/26/2016   CHOLHDL 2.2 03/26/2016   VLDL 22 03/26/2016   LDLCALC 54 03/26/2016   Lab Results  Component Value Date   TSH 1.02 12/03/2018   TSH 1.39 03/26/2016    Therapeutic Level Labs: No results found for: LITHIUM No results found for: VALPROATE No components found for:  CBMZ  Current Medications: Current Outpatient Medications  Medication Sig Dispense Refill  . calcium carbonate (TUMS - DOSED IN MG ELEMENTAL CALCIUM) 500 MG chewable tablet Chew 1-2  tablets by mouth as needed for indigestion or heartburn.     . dexlansoprazole (DEXILANT) 60 MG capsule Take 1 capsule (60 mg total) by mouth daily. 30 capsule 3  . doxycycline (VIBRA-TABS) 100 MG tablet Take 1 tablet (100 mg total) by mouth 2 (two) times daily. 20 tablet 0  . oxyCODONE-acetaminophen (PERCOCET) 10-325 MG tablet Take 1 tablet by mouth every 4 (four) hours as needed for pain. 30 tablet 0  . sertraline (ZOLOFT) 100 MG tablet 50 mg daily for four days, then 100 mg daily for four days, then 150 mg daily 45 tablet 0  Current Facility-Administered Medications  Medication Dose Route Frequency Provider Last Rate Last Admin  . nicotine polacrilex (NICORETTE) gum 2 mg  2 mg Oral PRN Lindell Spar, MD         Musculoskeletal: Strength & Muscle Tone: N/A Gait & Station: N/A Patient leans: N/A  Psychiatric Specialty Exam: Review of Systems  Psychiatric/Behavioral: Negative for agitation, behavioral problems, confusion, decreased concentration, dysphoric mood, hallucinations, self-injury, sleep disturbance and suicidal ideas. The patient is nervous/anxious. The patient is not hyperactive.   All other systems reviewed and are negative.   There were no vitals taken for this visit.There is no height or weight on file to calculate BMI.  General Appearance: Fairly Groomed  Eye Contact:  Good  Speech:  Clear and Coherent  Volume:  Normal  Mood:  Anxious  Affect:  Appropriate, Congruent and euthymic  Thought Process:  Coherent  Orientation:  Full (Time, Place, and Person)  Thought Content: Logical   Suicidal Thoughts:  No  Homicidal Thoughts:  No  Memory:  Immediate;   Good  Judgement:  Good  Insight:  Fair  Psychomotor Activity:  Normal  Concentration:  Concentration: Good and Attention Span: Good  Recall:  Good  Fund of Knowledge: Good  Language: Good  Akathisia:  No  Handed:  Right  AIMS (if indicated): not done  Assets:  Communication Skills Desire for Improvement   ADL's:  Intact  Cognition: WNL  Sleep:  Fair   Screenings: PHQ2-9   Flowsheet Row Video Visit from 12/27/2020 in [place] Office Visit from 09/20/2020 in [facility] Video Visit from 08/11/2020 in [facility] Office Visit from 06/13/2020 in [facility] Video Visit from 05/25/2020 in [facility]  PHQ-2 Total Score 0 0 0 1 0  PHQ-9 Total Score -- -- -- 3 --    Flowsheet Row Video Visit from 12/27/2020 in [place] [facility] Admission (Discharged) from 10/16/2020 in [facility] No Risk No Risk       Assessment and Plan:  Christopher Wyatt. is a 45 y.o. year old male with a history of OCD, anxiety, who presents for follow up appointment for below.   1. Mixed obsessional thoughts and acts 2. Anxiety He reports worsening in OCD symptoms and anxiety in the context of non adherence to sertraline.  We will restart this medication with up titration to optimize treatment for OCD and anxiety.  Noted that although he did use to report GI symptoms associated with sertraline, he reports improvement in his symptoms after he limit alcohol intake.  Will continue to monitor.   Plan 1.Restart sertraline- 50 mg daily for four days, then 100 mg daily for four days, then 150 mg daily  2. Next appointment: 5/19 at 4 PM for 30 mins, video  Past trials of  medication:Paxil,citalopram,lexapro,fluoxetine,venlafaxine(drowsiness),clomipramine,bupropion, Xanax (memory loss), clonazepam (drowsy)  The patient demonstrates the following risk factors for suicide: Chronic risk factors for suicide include:psychiatric disorder ofanxiety. Acute risk factorsfor suicide include: N/A. Protective factorsfor this patient include: positive social support, responsibility to others (children, family), coping skills and hope for the future. Considering these factors, the overall suicide risk at this  point appears to below. Patientisappropriate for outpatient follow up.    Norman Clay, MD 12/27/2020, 3:59 PM

## 2021-01-12 ENCOUNTER — Other Ambulatory Visit: Payer: Self-pay

## 2021-01-12 ENCOUNTER — Ambulatory Visit: Payer: Medicaid Other | Admitting: Nurse Practitioner

## 2021-01-12 ENCOUNTER — Encounter: Payer: Self-pay | Admitting: Nurse Practitioner

## 2021-01-12 DIAGNOSIS — R42 Dizziness and giddiness: Secondary | ICD-10-CM | POA: Diagnosis not present

## 2021-01-12 MED ORDER — ONDANSETRON 4 MG PO TBDP: 4.0000 mg | ORAL_TABLET | Freq: Three times a day (TID) | ORAL | 0 refills | Status: DC | PRN

## 2021-01-12 MED ORDER — MECLIZINE HCL 25 MG PO TABS: 25.0000 mg | ORAL_TABLET | Freq: Three times a day (TID) | ORAL | 0 refills | Status: DC | PRN

## 2021-01-12 NOTE — Progress Notes (Signed)
Acute Office Visit  Subjective:    Patient ID: Christopher Ahr., male    DOB: [birth year], 45 y.o.   MRN: [id number]  Chief Complaint  Patient presents with  . Dizziness    X 1 day   . Nausea    X 1 day     HPI Patient is in today for dizziness and nausea that started this AM.  He states he feels better now that he is upright.  It is worse when he changes position. He has hx of vertigo.  Past Medical History:  Diagnosis Date  . Anxiety    Chronic  . GERD (gastroesophageal reflux disease)   . Headache   . Neuroma   . OCD (obsessive compulsive disorder)     Past Surgical History:  Procedure Laterality Date  . CIRCUMCISION  2004  . ESOPHAGOGASTRODUODENOSCOPY (EGD) WITH PROPOFOL N/A 08/10/2020   Procedure: ESOPHAGOGASTRODUODENOSCOPY (EGD) WITH PROPOFOL;  Surgeon: Daneil Dolin, MD;  [facility];  Service: Endoscopy;  Laterality: N/A;  1:45pm  . EXCISION MORTON'S NEUROMA  10/16/2020   Procedure: EXCISION MORTON'S NEUROMA,AND REMOVAL OF INGROWN NAIL-GREAT TOES LEFT;  Surgeon: Felipa Furnace, DPM;  Location: [facility];  Service: Podiatry;;  . WISDOM TOOTH EXTRACTION  2011    Family History  Problem Relation Age of Onset  . Depression Mother   . Anxiety disorder Mother   . Depression Sister   . Bipolar disorder Paternal Uncle   . Schizophrenia Paternal Uncle   . Alcohol abuse Maternal Uncle   . Hypertension Father   . Colon cancer Neg Hx     Social History   Socioeconomic History  . Marital status: Significant Other    Spouse name: Not on file  . Number of children: 1  . Years of education: 40  . Highest education level: High school graduate  Occupational History  . Occupation: local truck driver  Tobacco Use  . Smoking status: Current Every Day Smoker    Packs/day: 0.50    Years: 4.00    Pack years: 2.00    Types: Cigarettes  . Smokeless tobacco: Former Systems developer    Types: Snuff  Vaping Use  . Vaping Use: Never used   Substance and Sexual Activity  . Alcohol use: Yes    Comment: 1-2 glasses of wine  few x week  . Drug use: No  . Sexual activity: Yes  Other Topics Concern  . Not on file  Social History Narrative   Lives at home, along with his son and parents.   Left-sided.   1-3 cup coffee, occasional soda.   Social Determinants of Health   Financial Resource Strain: Not on file  Food Insecurity: Not on file  Transportation Needs: Not on file  Physical Activity: Not on file  Stress: Not on file  Social Connections: Not on file  Intimate Partner Violence: Not on file    Outpatient Medications Prior to Visit  Medication Sig Dispense Refill  . calcium carbonate (TUMS - DOSED IN MG ELEMENTAL CALCIUM) 500 MG chewable tablet Chew 1-2 tablets by mouth as needed for indigestion or heartburn.     . dexlansoprazole (DEXILANT) 60 MG capsule Take 1 capsule (60 mg total) by mouth daily. 30 capsule 3  . sertraline (ZOLOFT) 100 MG tablet 50 mg daily for four days, then 100 mg daily for four days, then 150 mg daily 45 tablet 0  . doxycycline (VIBRA-TABS) 100 MG tablet Take 1 tablet (100 mg  total) by mouth 2 (two) times daily. 20 tablet 0  . oxyCODONE-acetaminophen (PERCOCET) 10-325 MG tablet Take 1 tablet by mouth every 4 (four) hours as needed for pain. 30 tablet 0  . nicotine polacrilex (NICORETTE) gum 2 mg      No facility-administered medications prior to visit.    Allergies  Allergen Reactions  . Paroxetine Hcl Other (See Comments)    Erectile dysfunction    Review of Systems  Constitutional: Negative.   HENT: Negative.   Respiratory: Negative.   Cardiovascular: Negative.   Gastrointestinal: Positive for nausea. Negative for vomiting.  Neurological: Positive for dizziness.       Objective:    Physical Exam  There were no vitals taken for this visit. Wt Readings from Last 3 Encounters:  11/02/20 189 lb 6.4 oz (85.9 kg)  10/16/20 186 lb 12.8 oz (84.7 kg)  09/20/20 189 lb (85.7 kg)     Health Maintenance Due  Topic Date Due  . COVID-19 Vaccine (1) Never done  . Hepatitis C Screening  Never done  . TETANUS/TDAP  Never done    There are no preventive care reminders to display for this patient.   Lab Results  Component Value Date   TSH 1.02 12/03/2018   Lab Results  Component Value Date   WBC 9.4 12/03/2018   HGB 17.7 (H) 12/03/2018   HCT 52.0 (H) 12/03/2018   MCV 90.4 12/03/2018   PLT 248 12/03/2018   Lab Results  Component Value Date   NA 139 12/03/2018   K 5.2 12/03/2018   CO2 22 12/03/2018   GLUCOSE 88 12/03/2018   BUN 6 (L) 12/03/2018   CREATININE 0.90 12/03/2018   BILITOT 0.9 12/03/2018   ALKPHOS 54 03/26/2016   AST 21 12/03/2018   ALT 21 12/03/2018   PROT 7.3 12/03/2018   ALBUMIN 4.8 03/26/2016   CALCIUM 10.2 12/03/2018   Lab Results  Component Value Date   CHOL 137 03/26/2016   Lab Results  Component Value Date   HDL 61 03/26/2016   Lab Results  Component Value Date   LDLCALC 54 03/26/2016   Lab Results  Component Value Date   TRIG 111 03/26/2016   Lab Results  Component Value Date   CHOLHDL 2.2 03/26/2016   Lab Results  Component Value Date   HGBA1C 5.2 12/03/2018       Assessment & Plan:   Problem List Items Addressed This Visit   None      Meds ordered this encounter  Medications  . meclizine (ANTIVERT) 25 MG tablet    Sig: Take 1 tablet (25 mg total) by mouth 3 (three) times daily as needed for dizziness.    Dispense:  30 tablet    Refill:  0  . ondansetron (ZOFRAN-ODT) 4 MG disintegrating tablet    Sig: Take 1 tablet (4 mg total) by mouth every 8 (eight) hours as needed for nausea or vomiting.    Dispense:  20 tablet    Refill:  0   Date:  01/12/2021   Location of Patient: Home Location of Provider: Office Consent was obtain for visit to be over via telehealth. I verified that I am speaking with the correct person using two identifiers.  I connected with  Christopher Ahr. on 01/12/21 via  telephone and verified that I am speaking with the correct person using two identifiers.   I discussed the limitations of evaluation and management by telemedicine. The patient expressed understanding and agreed to proceed.  Time spent: 9 min   Noreene Larsson, NP

## 2021-01-12 NOTE — Patient Instructions (Addendum)
Please provide a work note for today.   How to Perform the Epley Maneuver The Epley maneuver is an exercise that relieves symptoms of vertigo. Vertigo is the feeling that you or your surroundings are moving when they are not. When you feel vertigo, you may feel like the room is spinning and may have trouble walking. The Epley maneuver is used for a type of vertigo caused by a calcium deposit in a part of the inner ear. The maneuver involves changing head positions to help the deposit move out of the area. You can do this maneuver at home whenever you have symptoms of vertigo. You can repeat it in 24 hours if your vertigo has not gone away. Even though the Epley maneuver may relieve your vertigo for a few weeks, it is possible that your symptoms will return. This maneuver relieves vertigo, but it does not relieve dizziness. What are the risks? If it is done correctly, the Epley maneuver is considered safe. Sometimes it can lead to dizziness or nausea that goes away after a short time. If you develop other symptoms--such as changes in vision, weakness, or numbness--stop doing the maneuver and call your health care provider. Supplies needed:  A bed or table.  A pillow. How to do the Epley maneuver 1. Sit on the edge of a bed or table with your back straight and your legs extended or hanging over the edge of the bed or table. 2. Turn your head halfway toward the affected ear or side as told by your health care provider. 3. Lie backward quickly with your head turned until you are lying flat on your back. You may want to position a pillow under your shoulders. 4. Hold this position for at least 30 seconds. If you feel dizzy or have symptoms of vertigo, continue to hold the position until the symptoms stop. 5. Turn your head to the opposite direction until your unaffected ear is facing the floor. 6. Hold this position for at least 30 seconds. If you feel dizzy or have symptoms of vertigo, continue to  hold the position until the symptoms stop. 7. Turn your whole body to the same side as your head so that you are positioned on your side. Your head will now be nearly facedown. Hold for at least 30 seconds. If you feel dizzy or have symptoms of vertigo, continue to hold the position until the symptoms stop. 8. Sit back up. You can repeat the maneuver in 24 hours if your vertigo does not go away.      Follow these instructions at home: For 24 hours after doing the Epley maneuver:  Keep your head in an upright position.  When lying down to sleep or rest, keep your head raised (elevated) with two or more pillows.  Avoid excessive neck movements. Activity  Do not drive or use machinery if you feel dizzy.  After doing the Epley maneuver, return to your normal activities as told by your health care provider. Ask your health care provider what activities are safe for you. General instructions  Drink enough fluid to keep your urine pale yellow.  Do not drink alcohol.  Take over-the-counter and prescription medicines only as told by your health care provider.  Keep all follow-up visits as told by your health care provider. This is important. Preventing vertigo symptoms Ask your health care provider if there is anything you should do at home to prevent vertigo. He or she may recommend that you:  Keep your head  elevated with two or more pillows while you sleep.  Do not sleep on the side of your affected ear.  Get up slowly from bed.  Avoid sudden movements during the day.  Avoid extreme head positions or movement, such as looking up or bending over. Contact a health care provider if:  Your vertigo gets worse.  You have other symptoms, including: ? Nausea. ? Vomiting. ? Headache. Get help right away if you:  Have vision changes.  Have a headache or neck pain that is severe or getting worse.  Cannot stop vomiting.  Have new numbness or weakness in any part of your  body. Summary  Vertigo is the feeling that you or your surroundings are moving when they are not.  The Epley maneuver is an exercise that relieves symptoms of vertigo.  If the Epley maneuver is done correctly, it is considered safe and relieves vertigo quickly. This information is not intended to replace advice given to you by your health care provider. Make sure you discuss any questions you have with your health care provider. Document Revised: 06/23/2019 Document Reviewed: 06/23/2019 Elsevier Patient Education  2021 [employer].

## 2021-01-12 NOTE — Assessment & Plan Note (Signed)
-  Rx meclizine and zofran -epley maneuver handout provided via my chart

## 2021-01-22 NOTE — Progress Notes (Deleted)
BH MD/PA/NP OP Progress Note  01/22/2021 5:29 PM Christopher A Sigel Jr.  MRN:  [id number]  Chief Complaint:  HPI: *** Visit Diagnosis: No diagnosis found.  Past Psychiatric History: Please see initial evaluation for full details. I have reviewed the history. No updates at this time.     Past Medical History:  Past Medical History:  Diagnosis Date  . Anxiety    Chronic  . GERD (gastroesophageal reflux disease)   . Headache   . Neuroma   . OCD (obsessive compulsive disorder)     Past Surgical History:  Procedure Laterality Date  . CIRCUMCISION  2004  . ESOPHAGOGASTRODUODENOSCOPY (EGD) WITH PROPOFOL N/A 08/10/2020   Procedure: ESOPHAGOGASTRODUODENOSCOPY (EGD) WITH PROPOFOL;  Surgeon: Daneil Dolin, MD;  [facility];  Service: Endoscopy;  Laterality: N/A;  1:45pm  . EXCISION MORTON'S NEUROMA  10/16/2020   Procedure: EXCISION MORTON'S NEUROMA,AND REMOVAL OF INGROWN NAIL-GREAT TOES LEFT;  Surgeon: Felipa Furnace, DPM;  Location: [place];  Service: Podiatry;;  . WISDOM TOOTH EXTRACTION  2011    Family Psychiatric History: Please see initial evaluation for full details. I have reviewed the history. No updates at this time.     Family History:  Family History  Problem Relation Age of Onset  . Depression Mother   . Anxiety disorder Mother   . Depression Sister   . Bipolar disorder Paternal Uncle   . Schizophrenia Paternal Uncle   . Alcohol abuse Maternal Uncle   . Hypertension Father   . Colon cancer Neg Hx     Social History:  Social History   Socioeconomic History  . Marital status: Significant Other    Spouse name: Not on file  . Number of children: 1  . Years of education: 66  . Highest education level: High school graduate  Occupational History  . Occupation: local truck driver  Tobacco Use  . Smoking status: Current Every Day Smoker    Packs/day: 0.50    Years: 4.00    Pack years: 2.00    Types: Cigarettes  . Smokeless  tobacco: Former Systems developer    Types: Snuff  Vaping Use  . Vaping Use: Never used  Substance and Sexual Activity  . Alcohol use: Yes    Comment: 1-2 glasses of wine  few x week  . Drug use: No  . Sexual activity: Yes  Other Topics Concern  . Not on file  Social History Narrative   Lives at home, along with his son and parents.   Left-sided.   1-3 cup coffee, occasional soda.   Social Determinants of Health   Financial Resource Strain: Not on file  Food Insecurity: Not on file  Transportation Needs: Not on file  Physical Activity: Not on file  Stress: Not on file  Social Connections: Not on file    Allergies:  Allergies  Allergen Reactions  . Paroxetine Hcl Other (See Comments)    Erectile dysfunction    Metabolic Disorder Labs: Lab Results  Component Value Date   HGBA1C 5.2 12/03/2018   MPG 103 12/03/2018   No results found for: PROLACTIN Lab Results  Component Value Date   CHOL 137 03/26/2016   TRIG 111 03/26/2016   HDL 61 03/26/2016   CHOLHDL 2.2 03/26/2016   VLDL 22 03/26/2016   LDLCALC 54 03/26/2016   Lab Results  Component Value Date   TSH 1.02 12/03/2018   TSH 1.39 03/26/2016    Therapeutic Level Labs: No results found for: LITHIUM  No results found for: VALPROATE No components found for:  CBMZ  Current Medications: Current Outpatient Medications  Medication Sig Dispense Refill  . calcium carbonate (TUMS - DOSED IN MG ELEMENTAL CALCIUM) 500 MG chewable tablet Chew 1-2 tablets by mouth as needed for indigestion or heartburn.     . dexlansoprazole (DEXILANT) 60 MG capsule Take 1 capsule (60 mg total) by mouth daily. 30 capsule 3  . meclizine (ANTIVERT) 25 MG tablet Take 1 tablet (25 mg total) by mouth 3 (three) times daily as needed for dizziness. 30 tablet 0  . ondansetron (ZOFRAN-ODT) 4 MG disintegrating tablet Take 1 tablet (4 mg total) by mouth every 8 (eight) hours as needed for nausea or vomiting. 20 tablet 0  . sertraline (ZOLOFT) 100 MG tablet 50  mg daily for four days, then 100 mg daily for four days, then 150 mg daily 45 tablet 0   No current facility-administered medications for this visit.     Musculoskeletal: Strength & Muscle Tone: N/A Gait & Station: N/A Patient leans: N/A  Psychiatric Specialty Exam: Review of Systems  There were no vitals taken for this visit.There is no height or weight on file to calculate BMI.  General Appearance: {Appearance:22683}  Eye Contact:  {BHH EYE CONTACT:22684}  Speech:  Clear and Coherent  Volume:  Normal  Mood:  {BHH MOOD:22306}  Affect:  {Affect (PAA):22687}  Thought Process:  Coherent  Orientation:  Full (Time, Place, and Person)  Thought Content: Logical   Suicidal Thoughts:  {ST/HT (PAA):22692}  Homicidal Thoughts:  {ST/HT (PAA):22692}  Memory:  Immediate;   Good  Judgement:  {Judgement (PAA):22694}  Insight:  {Insight (PAA):22695}  Psychomotor Activity:  Normal  Concentration:  Concentration: Good and Attention Span: Good  Recall:  Good  Fund of Knowledge: Good  Language: Good  Akathisia:  No  Handed:  Right  AIMS (if indicated): not done  Assets:  Communication Skills Desire for Improvement  ADL's:  Intact  Cognition: WNL  Sleep:  {BHH GOOD/FAIR/POOR:22877}   Screenings: PHQ2-9   [facility] Office Visit from 01/12/2021 in [facility] Video Visit from 12/27/2020 in [facility] Office Visit from 09/20/2020 in [facility] Video Visit from 08/11/2020 in [facility] Office Visit from 06/13/2020 in [facility]  PHQ-2 Total Score 0 0 0 0 1  PHQ-9 Total Score -- -- -- -- 3    Flowsheet Row Video Visit from 12/27/2020 in [facility] (Discharged) from 10/16/2020 in [facility]  C-SSRS RISK CATEGORY No Risk No Risk       Assessment and Plan:  Christopher Kinnett. is a 45 y.o. year old male with a history of OCD, anxiety , who presents for follow up appointment  for below.    1. Mixed obsessional thoughts and acts 2. Anxiety He reports worsening in OCD symptoms and anxiety in the context of non adherence to sertraline.  We will restart this medication with up titration to optimize treatment for OCD and anxiety.  Noted that although he did use to report GI symptoms associated with sertraline, he reports improvement in his symptoms after he limit alcohol intake.  Will continue to monitor.   Plan 1.Restart sertraline- 50 mg daily for four days, then 100 mg daily for four days, then 150 mg daily  2. Next appointment: 5/19 at 4 PM for 30 mins, video  Past trials of  medication:Paxil,citalopram,lexapro,fluoxetine,venlafaxine(drowsiness),clomipramine,bupropion, Xanax (memory loss), clonazepam (drowsy)  The patient demonstrates the following risk  factors for suicide: Chronic risk factors for suicide include:psychiatric disorder ofanxiety. Acute risk factorsfor suicide include: N/A. Protective factorsfor this patient include: positive social support, responsibility to others (children, family), coping skills and hope for the future. Considering these factors, the overall suicide risk at this point appears to below. Patientisappropriate for outpatient follow up.        Norman Clay, MD 01/22/2021, 5:29 PM

## 2021-01-25 ENCOUNTER — Telehealth: Payer: Self-pay | Admitting: Psychiatry

## 2021-01-25 ENCOUNTER — Other Ambulatory Visit: Payer: Self-pay

## 2021-01-25 ENCOUNTER — Telehealth: Payer: Medicaid Other | Admitting: Psychiatry

## 2021-01-25 NOTE — Telephone Encounter (Signed)
Sent link for video visit through Epic. Patient did not sign in. Called the patient for appointment scheduled today. The patient did not answer the phone. Left voice message to contact the office ([phone]).   ?

## 2021-01-30 ENCOUNTER — Other Ambulatory Visit: Payer: Self-pay | Admitting: Nurse Practitioner

## 2021-01-30 DIAGNOSIS — R197 Diarrhea, unspecified: Secondary | ICD-10-CM

## 2021-01-30 DIAGNOSIS — K219 Gastro-esophageal reflux disease without esophagitis: Secondary | ICD-10-CM

## 2021-02-08 ENCOUNTER — Encounter: Payer: Self-pay | Admitting: Emergency Medicine

## 2021-02-08 ENCOUNTER — Ambulatory Visit (INDEPENDENT_AMBULATORY_CARE_PROVIDER_SITE_OTHER): Payer: Medicaid Other

## 2021-02-08 ENCOUNTER — Ambulatory Visit: Payer: Medicaid Other | Admitting: Internal Medicine

## 2021-02-08 ENCOUNTER — Other Ambulatory Visit: Payer: Self-pay

## 2021-02-08 ENCOUNTER — Ambulatory Visit
Admission: EM | Admit: 2021-02-08 | Discharge: 2021-02-08 | Disposition: A | Discharge status: Home / Self Care | Payer: Medicaid Other | Attending: Internal Medicine | Admitting: Internal Medicine

## 2021-02-08 DIAGNOSIS — M25511 Pain in right shoulder: Secondary | ICD-10-CM

## 2021-02-08 MED ORDER — TRAMADOL HCL 50 MG PO TABS: 50.0000 mg | ORAL_TABLET | Freq: Four times a day (QID) | ORAL | 0 refills | Status: DC | PRN

## 2021-02-08 MED ORDER — PREDNISONE 50 MG PO TABS: ORAL_TABLET | ORAL | 0 refills | Status: DC

## 2021-02-08 NOTE — Discharge Instructions (Signed)
Follow up with orthopedics to have more work up like an MRI if symptoms continue

## 2021-02-08 NOTE — ED Provider Notes (Signed)
[facility]    CSN: [id number] Arrival date & time: 02/08/21  1325      History   Chief Complaint No chief complaint on file.   HPI Christopher Wyatt. is a 45 y.o. male who presents due to having R shoulder pain x 6 days. His pain radiates to his elbow. Pain is described as throbbing.  He denies neck pain or past hx of neck injury. He does tend to sleep on his R a lot and switched to his left side when in pain. He is able to move his shoulder. Pain present worse when arm is resting. Has felt a little tingling on ring and small finger off and on today.  Saw a chiropractor yesterday who told him his R traps is tight.  Has been applying linamint cream and has not helped   Past Medical History:  Diagnosis Date  . Anxiety    Chronic  . GERD (gastroesophageal reflux disease)   . Headache   . Neuroma   . OCD (obsessive compulsive disorder)     Patient Active Problem List   Diagnosis Date Noted  . Vertigo 01/12/2021  . Pre-op exam 09/20/2020  . Erosive esophagitis 08/11/2020  . Morton's neuroma of left foot 08/11/2020  . Diarrhea 08/01/2020  . Neck pain 06/27/2020  . Nicotine dependence 06/13/2020  . Cervical pain (neck) 06/13/2020  . Alcohol abuse 04/20/2020  . GERD (gastroesophageal reflux disease) 10/12/2019  . Mixed obsessional thoughts and acts 09/27/2019  . 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 Nephrolithiasis 01/27/2014  . Tobacco use disorder 11/05/2013  . OCD (obsessive compulsive disorder)     Past Surgical History:  Procedure Laterality Date  . CIRCUMCISION  2004  . ESOPHAGOGASTRODUODENOSCOPY (EGD) WITH PROPOFOL N/A 08/10/2020   Procedure: ESOPHAGOGASTRODUODENOSCOPY (EGD) WITH PROPOFOL;  Surgeon: Daneil Dolin, MD;  [facility];  Service: Endoscopy;  Laterality: N/A;  1:45pm  . EXCISION MORTON'S NEUROMA  10/16/2020   Procedure: EXCISION MORTON'S NEUROMA,AND REMOVAL OF INGROWN NAIL-GREAT TOES LEFT;  Surgeon: Felipa Furnace, DPM;  Location: [facility];  Service: Podiatry;;  . WISDOM TOOTH EXTRACTION  2011       Home Medications    Prior to Admission medications   Medication Sig Start Date End Date Taking? Authorizing Provider  predniSONE (DELTASONE) 50 MG tablet One qd 02/08/21  Yes Rodriguez-Southworth, Sunday Spillers, PA-C  traMADol (ULTRAM) 50 MG tablet Take 1 tablet (50 mg total) by mouth every 6 (six) hours as needed. 02/08/21  Yes Rodriguez-Southworth, Sunday Spillers, PA-C  calcium carbonate (TUMS - DOSED IN MG ELEMENTAL CALCIUM) 500 MG chewable tablet Chew 1-2 tablets by mouth as needed for indigestion or heartburn.     [provider]  dexlansoprazole (DEXILANT) 60 MG capsule TAKE 1 CAPSULE(60 MG) BY MOUTH DAILY 02/01/21   Annitta Needs, NP  sertraline (ZOLOFT) 100 MG tablet 50 mg daily for four days, then 100 mg daily for four days, then 150 mg daily 12/27/20 02/08/21  Norman Clay, MD    Family History Family History  Problem Relation Age of Onset  . Depression Mother   . Anxiety disorder Mother   . Depression Sister   . Bipolar disorder Paternal Uncle   . Schizophrenia Paternal Uncle   . Alcohol abuse Maternal Uncle   . Hypertension Father   . Colon cancer Neg Hx     Social History Social History   Tobacco Use  . Smoking status: Current Every Day Smoker  Packs/day: 0.50    Years: 4.00    Pack years: 2.00    Types: Cigarettes  . Smokeless tobacco: Former Systems developer    Types: Snuff  Vaping Use  . Vaping Use: Never used  Substance Use Topics  . Alcohol use: Yes    Comment: 1-2 glasses of wine  few x week  . Drug use: No     Allergies   Paroxetine hcl   Review of Systems Review of Systems  Musculoskeletal: Positive for arthralgias. Negative for neck pain and neck stiffness.  Skin: Negative for color change, pallor and rash.  Neurological: Negative for weakness and numbness.     Physical Exam Triage Vital Signs ED Triage Vitals  Enc Vitals Group     BP 02/08/21  1349 (!) 153/81     Pulse Rate 02/08/21 1349 89     Resp 02/08/21 1349 16     Temp 02/08/21 1349 97.7 F (36.5 C)     Temp Source 02/08/21 1349 Temporal     SpO2 02/08/21 1349 95 %     Weight --      Height --      Head Circumference --      Peak Flow --      Pain Score 02/08/21 1351 8     Pain Loc --      Pain Edu? --      Excl. in [place]? --    No data found.  Updated Vital Signs BP (!) 153/81 (BP Location: Right Arm)   Pulse 89   Temp 97.7 F (36.5 C) (Temporal)   Resp 16   SpO2 95%   Visual Acuity Right Eye Distance:   Left Eye Distance:   Bilateral Distance:    Right Eye Near:   Left Eye Near:    Bilateral Near:     Physical Exam Vitals and nursing note reviewed.  Constitutional:      General: He is not in acute distress.    Appearance: He is normal weight. He is not toxic-appearing.  HENT:     Head: Normocephalic.     Right Ear: External ear normal.     Left Ear: External ear normal.  Eyes:     General: No scleral icterus.    Conjunctiva/sclera: Conjunctivae normal.  Cardiovascular:     Pulses: Normal pulses.  Pulmonary:     Effort: Pulmonary effort is normal.  Musculoskeletal:        General: Normal range of motion.     Cervical back: Neck supple.     Comments: R SHOULDER- has mild swelling of anterior R shuolder with tenderness on bicep tendon and deep under the anterior shoulder capsule. + impingement sign.   Skin:    General: Skin is warm and dry.     Coloration: Skin is not pale.     Findings: No bruising, erythema or rash.  Neurological:     Mental Status: He is alert and oriented to person, place, and time.     Gait: Gait normal.     Deep Tendon Reflexes: Reflexes normal.  Psychiatric:        Mood and Affect: Mood normal.        Behavior: Behavior normal.        Thought Content: Thought content normal.        Judgment: Judgment normal.      UC Treatments / Results  Labs (all labs ordered are listed, but only abnormal results are  displayed) Labs Reviewed -  No data to display  EKG   Radiology DG Shoulder Right  Result Date: 02/08/2021 CLINICAL DATA:  Right shoulder pain. EXAM: RIGHT SHOULDER - 2+ VIEW COMPARISON:  Chest x-ray 06/11/2018. FINDINGS: Glenohumeral degenerative change. No evidence of fracture, dislocation, or separation. No acute abnormality identified. IMPRESSION: Glenohumeral degenerative change.  No acute abnormality. Electronically Signed   By: Marcello Moores  Register   On: 02/08/2021 14:54    Procedures Procedures (including critical care time)  Medications Ordered in UC Medications - No data to display  Initial Impression / Assessment and Plan / UC Course  I have reviewed the triage vital signs and the nursing notes. Pertinent  imaging results that were available during my care of the patient were reviewed by me and considered in my medical decision making (see chart for details). R shoulder pain with degenerative changes found in xray. May also have some rotator cuff inflammation. Needs to Fu with ortho. I placed him on prednisone and tramadol as noted.   Final Clinical Impressions(s) / UC Diagnoses   Final diagnoses:  Pain in joint of right shoulder     Discharge Instructions     Follow up with orthopedics to have more work up like an MRI if symptoms continue    ED Prescriptions    Medication Sig Dispense Auth. Provider   predniSONE (DELTASONE) 50 MG tablet One qd 5 tablet Rodriguez-Southworth, Denim Start, PA-C   traMADol (ULTRAM) 50 MG tablet Take 1 tablet (50 mg total) by mouth every 6 (six) hours as needed. 10 tablet Rodriguez-Southworth, Sunday Spillers, PA-C     I have reviewed the PDMP during this encounter.   Shelby Mattocks, Hershal Coria 02/08/21 2135

## 2021-02-08 NOTE — ED Triage Notes (Signed)
Right shoulder pain since Friday.  Saw a chiropractor for pain.  Pain radiates down to elbow.  Pain is worse when sitting still.

## 2021-02-12 ENCOUNTER — Emergency Department (HOSPITAL_COMMUNITY)
Admission: EM | Admit: 2021-02-12 | Discharge: 2021-02-12 | Disposition: A | Discharge status: Home / Self Care | Payer: Medicaid Other | Attending: Emergency Medicine | Admitting: Emergency Medicine

## 2021-02-12 ENCOUNTER — Encounter (HOSPITAL_COMMUNITY): Payer: Self-pay

## 2021-02-12 ENCOUNTER — Other Ambulatory Visit: Payer: Self-pay

## 2021-02-12 DIAGNOSIS — M25511 Pain in right shoulder: Secondary | ICD-10-CM | POA: Insufficient documentation

## 2021-02-12 DIAGNOSIS — X503XXA Overexertion from repetitive movements, initial encounter: Secondary | ICD-10-CM | POA: Insufficient documentation

## 2021-02-12 DIAGNOSIS — F17210 Nicotine dependence, cigarettes, uncomplicated: Secondary | ICD-10-CM | POA: Diagnosis not present

## 2021-02-12 DIAGNOSIS — Y92812 Truck as the place of occurrence of the external cause: Secondary | ICD-10-CM | POA: Diagnosis not present

## 2021-02-12 MED ORDER — METHOCARBAMOL 500 MG PO TABS
500.0000 mg | ORAL_TABLET | Freq: Two times a day (BID) | ORAL | 0 refills | Status: DC
Start: 2021-02-12 — End: 2021-05-02

## 2021-02-12 MED ORDER — LIDOCAINE 5 % EX PTCH
1.0000 | MEDICATED_PATCH | CUTANEOUS | Status: DC
  Administered 2021-02-12: 1 via TRANSDERMAL
  Filled 2021-02-12: qty 1

## 2021-02-12 MED ORDER — IBUPROFEN 800 MG PO TABS
800.0000 mg | ORAL_TABLET | Freq: Three times a day (TID) | ORAL | 0 refills | Status: DC
Start: 2021-02-12 — End: 2021-12-03

## 2021-02-12 MED ORDER — HYDROCODONE-ACETAMINOPHEN 5-325 MG PO TABS: 1.0000 | ORAL_TABLET | ORAL | 0 refills | Status: DC | PRN

## 2021-02-12 NOTE — ED Notes (Signed)
Pt. States that their right arm felt "tingly" a couple days ago and that they also felt a burning sensation. Pt. States pain radiates from right side of the neck down to the upper right arm.

## 2021-02-12 NOTE — ED Triage Notes (Signed)
Pt reports went to urgent care Thursday for c/o r shoulder pain.  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  Says had X ray on Thursday and was told he may have a tear in rotator cuff and was told to come to ed for further eval if still having pain today.

## 2021-02-12 NOTE — Discharge Instructions (Addendum)
You were evaluated in the Emergency Department and after careful evaluation, we did not find any emergent condition requiring admission or further testing in the hospital.  As discussed, you will need to follow-up with orthopedic surgery, I provided Dr. Gerrit Heck phone number in your discharge paperwork.  Please call the phone number to schedule an appointment.  You may also use the Lidoderm patches (which can be found at your local pharmacy) or the muscle relaxer,  but do not use them together.  Please take the muscle relaxer at night and do not drink or drive the medications due to its sedating side effects.  Please also take 800 mg of ibuprofen up to 3 times daily for approximately a week, you may use the Norco for breakthrough pain.  He may also apply ice, Voltaren gel which is an anti-inflammatory cream and can be found at any local pharmacy.  Please return to the Emergency Department if you experience any worsening of your condition.    Thank you for allowing [place] to be a part of your care.

## 2021-02-12 NOTE — ED Provider Notes (Signed)
[facility] EMERGENCY DEPARTMENT Provider Note   CSN: [id number] Arrival date & time: 02/12/21  4098     History Chief Complaint  Patient presents with  . Shoulder Pain    [facility]. is a 45 y.o. male.  HPI 45 year old male with a history of anxiety, GERD, headaches, OCD presents to the ER with complaints of right shoulder pain.  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  Started to have some pain in the trap muscles which then started to radiate down to the shoulder on Wednesday.  He was seen in urgent care on Thursday had an x-ray done, and was told that he has a rotator cuff tear.  He is to lift his arm with no difficulty.  Denies any numbness or tingling.  No noticeable redness or swelling.  He was started on prednisone and given tramadol, which she states has not been helping.  He was using liniment cream prior to being seen at urgent care with little relief.  Denies any falls or injuries.  No fevers or chills.  No history of IV drug use    Past Medical History:  Diagnosis Date  . Anxiety    Chronic  . GERD (gastroesophageal reflux disease)   . Headache   . Neuroma   . OCD (obsessive compulsive disorder)     Patient Active Problem List   Diagnosis Date Noted  . Vertigo 01/12/2021  . Pre-op exam 09/20/2020  . Erosive esophagitis 08/11/2020  . Morton's neuroma of left foot 08/11/2020  . Diarrhea 08/01/2020  . Neck pain 06/27/2020  . Nicotine dependence 06/13/2020  . Cervical pain (neck) 06/13/2020  . Alcohol abuse 04/20/2020  . GERD (gastroesophageal reflux disease) 10/12/2019  . Mixed obsessional thoughts and acts 09/27/2019  . 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 Nephrolithiasis 01/27/2014  . Tobacco use disorder 11/05/2013  . OCD (obsessive compulsive disorder)     Past Surgical History:  Procedure Laterality Date  . CIRCUMCISION  2004  . ESOPHAGOGASTRODUODENOSCOPY (EGD)  WITH PROPOFOL N/A 08/10/2020   Procedure: ESOPHAGOGASTRODUODENOSCOPY (EGD) WITH PROPOFOL;  Surgeon: Daneil Dolin, MD;  [facility];  Service: Endoscopy;  Laterality: N/A;  1:45pm  . EXCISION MORTON'S NEUROMA  10/16/2020   Procedure: EXCISION MORTON'S NEUROMA,AND REMOVAL OF INGROWN NAIL-GREAT TOES LEFT;  Surgeon: Felipa Furnace, DPM;  Location: [place];  Service: Podiatry;;  . WISDOM TOOTH EXTRACTION  2011       Family History  Problem Relation Age of Onset  . Depression Mother   . Anxiety disorder Mother   . Depression Sister   . Bipolar disorder Paternal Uncle   . Schizophrenia Paternal Uncle   . Alcohol abuse Maternal Uncle   . Hypertension Father   . Colon cancer Neg Hx     Social History   Tobacco Use  . Smoking status: Current Every Day Smoker    Packs/day: 0.50    Years: 4.00    Pack years: 2.00    Types: Cigarettes  . Smokeless tobacco: Former Systems developer    Types: Snuff  Vaping Use  . Vaping Use: Never used  Substance Use Topics  . Alcohol use: Yes    Comment: 1-2 glasses of wine  few x week  . Drug use: No    Home Medications Prior to Admission medications   Medication Sig Start Date End Date Taking? Authorizing Provider  dexlansoprazole Ross Marcus)  60 MG capsule TAKE 1 CAPSULE(60 MG) BY MOUTH DAILY 02/01/21  Yes Annitta Needs, NP  HYDROcodone-acetaminophen (NORCO/VICODIN) 5-325 MG tablet Take 1 tablet by mouth every 4 (four) hours as needed for up to 5 doses. 02/12/21  Yes Garald Balding, PA-C  ibuprofen (ADVIL) 800 MG tablet Take 1 tablet (800 mg total) by mouth 3 (three) times daily. 02/12/21  Yes Garald Balding, PA-C  methocarbamol (ROBAXIN) 500 MG tablet Take 1 tablet (500 mg total) by mouth 2 (two) times daily. 02/12/21  Yes Garald Balding, PA-C  predniSONE (DELTASONE) 50 MG tablet One qd Patient taking differently: Take 50 mg by mouth daily with breakfast. 02/08/21  Yes Rodriguez-Southworth, Sunday Spillers, PA-C  traMADol (ULTRAM) 50 MG tablet  Take 1 tablet (50 mg total) by mouth every 6 (six) hours as needed. 02/08/21  Yes Rodriguez-Southworth, Sunday Spillers, PA-C  sertraline (ZOLOFT) 100 MG tablet 50 mg daily for four days, then 100 mg daily for four days, then 150 mg daily 12/27/20 02/08/21  Norman Clay, MD    Allergies    Paroxetine hcl  Review of Systems   Review of Systems  Musculoskeletal: Positive for arthralgias.  Neurological: Negative for weakness and numbness.    Physical Exam Updated Vital Signs BP (!) 125/95 (BP Location: Right Arm)   Pulse 87   Temp 98.6 F (37 C) (Oral)   Resp 18   Ht 6' (1.829 m)   Wt 83 kg   SpO2 99%   BMI 24.82 kg/m   Physical Exam Vitals reviewed.  Constitutional:      Appearance: Normal appearance.  HENT:     Head: Normocephalic and atraumatic.  Eyes:     General:        Right eye: No discharge.        Left eye: No discharge.     Extraocular Movements: Extraocular movements intact.     Conjunctiva/sclera: Conjunctivae normal.  Pulmonary:     Effort: Pulmonary effort is normal.     Breath sounds: Normal breath sounds.  Abdominal:     General: Abdomen is flat.  Musculoskeletal:        General: Tenderness present. No swelling. Normal range of motion.     Comments: Full range of motion of right shoulder with 5/5 strength in upper extremities.  Full flexion and extension of the wrists, elbows.  No significant swelling, erythema noted.  Tenderness to palpation over the biceps tendon.  Tenderness to palpation along the right trapezius muscles.  Neurological:     General: No focal deficit present.     Mental Status: He is alert and oriented to person, place, and time.     Sensory: No sensory deficit.     Motor: No weakness.  Psychiatric:        Mood and Affect: Mood normal.        Behavior: Behavior normal.     ED Results / Procedures / Treatments   Labs (all labs ordered are listed, but only abnormal results are displayed) Labs Reviewed - No data to  display  EKG None  Radiology No results found.  Procedures Procedures   Medications Ordered in ED Medications  lidocaine (LIDODERM) 5 % 1 patch (has no administration in time range)    ED Course  I have reviewed the triage vital signs and the nursing notes.  Pertinent labs & imaging results that were available during my care of the patient were reviewed by me and considered in my medical decision making (see  chart for details).    MDM Rules/Calculators/A&P                          45 year old male presents to the ER with complaints of right shoulder pain.  On physical exam, he is full range of motion of his right shoulder including above midline.  Equal strength and neurovascularly intact.  No signs of DVT.  Suspect possible biceps tendinitis vs arthritic pain vs rotator cuff inflammation. No signs of tear.  Tenderness to palpation along the right trapezius muscles as well.  Stressed to the patient that he needs to follow-up with orthopedics, will provide several pills of Norco, stressed using ibuprofen and using Norco for breakthrough pain.  PDMP reviewed, appropriate. We will also provide Lidoderm patches and muscle relaxer.  Patient educated on the sedating side effects of the muscle relaxer and was told to not drink or drive on this medication and to take at night.  Will refer to Dr. Amedeo Kinsman with orthopedic surgery.  Encouraged icing, gentle range of motion stretches.  Will provide work note.  We discussed return precautions.  He voiced her standing and is agreeable.  Stable for discharge. Final Clinical Impression(s) / ED Diagnoses Final diagnoses:  Acute pain of right shoulder    Rx / DC Orders ED Discharge Orders         Ordered    HYDROcodone-acetaminophen (NORCO/VICODIN) 5-325 MG tablet  Every 4 hours PRN        02/12/21 1239    ibuprofen (ADVIL) 800 MG tablet  3 times daily        02/12/21 1239    methocarbamol (ROBAXIN) 500 MG tablet  2 times daily        02/12/21  1239           Lyndel Safe 02/12/21 Jackson, Ankit, MD 02/13/21 1224

## 2021-02-13 ENCOUNTER — Telehealth: Payer: Self-pay | Admitting: *Deleted

## 2021-02-13 NOTE — Telephone Encounter (Signed)
Transition Care Management Unsuccessful Follow-up Telephone Call  Date of discharge and from where:  02/12/2021 - [facility]  Attempts:  1st Attempt  Reason for unsuccessful TCM follow-up call:  Left voice message

## 2021-02-14 NOTE — Telephone Encounter (Signed)
Transition Care Management Unsuccessful Follow-up Telephone Call  Date of discharge and from where:  02/12/2021 - [facility]  Attempts:  2nd Attempt  Reason for unsuccessful TCM follow-up call:  Left voice message

## 2021-02-15 NOTE — Telephone Encounter (Signed)
Transition Care Management Unsuccessful Follow-up Telephone Call  Date of discharge and from where:  02/12/2021 - [facility]  Attempts:  3rd Attempt  Reason for unsuccessful TCM follow-up call:  Left voice message

## 2021-02-23 DIAGNOSIS — M542 Cervicalgia: Secondary | ICD-10-CM | POA: Diagnosis not present

## 2021-02-23 DIAGNOSIS — M5412 Radiculopathy, cervical region: Secondary | ICD-10-CM | POA: Diagnosis not present

## 2021-02-26 DIAGNOSIS — M25519 Pain in unspecified shoulder: Secondary | ICD-10-CM | POA: Insufficient documentation

## 2021-02-26 DIAGNOSIS — M25511 Pain in right shoulder: Secondary | ICD-10-CM | POA: Diagnosis not present

## 2021-03-13 DIAGNOSIS — M542 Cervicalgia: Secondary | ICD-10-CM | POA: Diagnosis not present

## 2021-03-14 DIAGNOSIS — M542 Cervicalgia: Secondary | ICD-10-CM | POA: Diagnosis not present

## 2021-03-16 DIAGNOSIS — M542 Cervicalgia: Secondary | ICD-10-CM | POA: Diagnosis not present

## 2021-04-05 DIAGNOSIS — M542 Cervicalgia: Secondary | ICD-10-CM | POA: Diagnosis not present

## 2021-04-05 DIAGNOSIS — M5030 Other cervical disc degeneration, unspecified cervical region: Secondary | ICD-10-CM | POA: Insufficient documentation

## 2021-04-10 ENCOUNTER — Telehealth: Payer: Self-pay | Admitting: Internal Medicine

## 2021-04-10 NOTE — Telephone Encounter (Signed)
PATIENT NEEDS HIS GABAPENTIN SENT TO [place] ON [address].  HE SAID HE IS DOWN TO THE LAST PILL AND HE CAN NOT MISS A DOSE.  SAID THE PHARMACY SENT THE REQUEST YESTERDAY 04/09/21

## 2021-04-10 NOTE — Telephone Encounter (Signed)
Spoke to pt.  He needs Dexilant filled.  Tried to call pharmacy to see if they have our current RX on file.  Was on hold for 10 minutes.  Will try again in the morning.

## 2021-04-11 NOTE — Telephone Encounter (Signed)
Spoke to pharmacy.  Was informed that pt picked up 90 day supply in May and RX is not due to be refilled until 05/04/2021.  Called pt back and made him aware.  He said he never picked it up.  Informed him to call them so they could discuss it further.  Pt voiced understanding.

## 2021-04-26 DIAGNOSIS — M542 Cervicalgia: Secondary | ICD-10-CM | POA: Diagnosis not present

## 2021-04-28 ENCOUNTER — Ambulatory Visit
Admission: EM | Admit: 2021-04-28 | Discharge: 2021-04-28 | Disposition: A | Discharge status: Home / Self Care | Payer: Medicaid Other | Attending: Internal Medicine | Admitting: Internal Medicine

## 2021-04-28 ENCOUNTER — Other Ambulatory Visit: Payer: Self-pay

## 2021-04-28 DIAGNOSIS — U071 COVID-19: Secondary | ICD-10-CM | POA: Diagnosis not present

## 2021-04-28 MED ORDER — BENZONATATE 100 MG PO CAPS: 100.0000 mg | ORAL_CAPSULE | Freq: Three times a day (TID) | ORAL | 0 refills | Status: DC

## 2021-04-28 NOTE — ED Provider Notes (Signed)
[facility]    CSN: [id number] Arrival date & time: 04/28/21  1006      History   Chief Complaint Chief Complaint  Patient presents with   Covid Positive    HPI Christopher Wyatt. is a 45 y.o. male diagnosed with COVID-19 virus on Thursday comes to urgent care with complaints of cough, generalized body aches and fatigue.  His oral intake is fair.  No shortness of breath or wheezing.  No nausea or vomiting.  No diarrhea.   HPI  Past Medical History:  Diagnosis Date   Anxiety    Chronic   GERD (gastroesophageal reflux disease)    Headache    Neuroma    OCD (obsessive compulsive disorder)     Patient Active Problem List   Diagnosis Date Noted   Vertigo 01/12/2021   Pre-op exam 09/20/2020   Erosive esophagitis 08/11/2020   Morton's neuroma of left foot 08/11/2020   Diarrhea 08/01/2020   Neck pain 06/27/2020   Nicotine dependence 06/13/2020   Cervical pain (neck) 06/13/2020   Alcohol abuse 04/20/2020   GERD (gastroesophageal reflux disease) 10/12/2019   Mixed obsessional thoughts and acts 09/27/2019   Mixed anxiety and depressive disorder 02/05/2019   Nephrolithiasis 01/27/2014   Tobacco use disorder 11/05/2013   OCD (obsessive compulsive disorder)     Past Surgical History:  Procedure Laterality Date   CIRCUMCISION  2004   ESOPHAGOGASTRODUODENOSCOPY (EGD) WITH PROPOFOL N/A 08/10/2020   Procedure: ESOPHAGOGASTRODUODENOSCOPY (EGD) WITH PROPOFOL;  Surgeon: Daneil Dolin, MD;  Location: [facility];  Service: Endoscopy;  Laterality: N/A;  1:45pm   EXCISION MORTON'S NEUROMA  10/16/2020   Procedure: EXCISION MORTON'S NEUROMA,AND REMOVAL OF INGROWN NAIL-GREAT TOES LEFT;  Surgeon: Felipa Furnace, DPM;  Location: [place];  Service: Podiatry;;   WISDOM TOOTH EXTRACTION  2011       Home Medications    Prior to Admission medications   Medication Sig Start Date End Date Taking? Authorizing Provider  benzonatate (TESSALON) 100 MG  capsule Take 1 capsule (100 mg total) by mouth every 8 (eight) hours. 04/28/21  Yes Lillionna Nabi, Myrene Galas, MD  dexlansoprazole (DEXILANT) 60 MG capsule TAKE 1 CAPSULE(60 MG) BY MOUTH DAILY 02/01/21   Annitta Needs, NP  HYDROcodone-acetaminophen (NORCO/VICODIN) 5-325 MG tablet Take 1 tablet by mouth every 4 (four) hours as needed for up to 5 doses. 02/12/21   Garald Balding, PA-C  ibuprofen (ADVIL) 800 MG tablet Take 1 tablet (800 mg total) by mouth 3 (three) times daily. 02/12/21   Garald Balding, PA-C  methocarbamol (ROBAXIN) 500 MG tablet Take 1 tablet (500 mg total) by mouth 2 (two) times daily. 02/12/21   Garald Balding, PA-C  predniSONE (DELTASONE) 50 MG tablet One qd Patient taking differently: Take 50 mg by mouth daily with breakfast. 02/08/21   Rodriguez-Southworth, Sunday Spillers, PA-C  traMADol (ULTRAM) 50 MG tablet Take 1 tablet (50 mg total) by mouth every 6 (six) hours as needed. 02/08/21   Rodriguez-Southworth, Sunday Spillers, PA-C  sertraline (ZOLOFT) 100 MG tablet 50 mg daily for four days, then 100 mg daily for four days, then 150 mg daily 12/27/20 02/08/21  Norman Clay, MD    Family History Family History  Problem Relation Age of Onset   Depression Mother    Anxiety disorder Mother    Depression Sister    Bipolar disorder Paternal Uncle    Schizophrenia Paternal Uncle    Alcohol abuse Maternal Uncle    Hypertension Father  Colon cancer Neg Hx     Social History Social History   Tobacco Use   Smoking status: Every Day    Packs/day: 0.50    Years: 4.00    Pack years: 2.00    Types: Cigarettes   Smokeless tobacco: Former    Types: Snuff  Vaping Use   Vaping Use: Never used  Substance Use Topics   Alcohol use: Yes    Comment: 1-2 glasses of wine  few x week   Drug use: No     Allergies   Paroxetine hcl   Review of Systems Review of Systems  Respiratory:  Positive for cough and shortness of breath. Negative for chest tightness.   Cardiovascular: Negative.   Gastrointestinal:  Negative.   Genitourinary: Negative.     Physical Exam Triage Vital Signs ED Triage Vitals  Enc Vitals Group     BP 04/28/21 1226 119/86     Pulse Rate 04/28/21 1226 96     Resp 04/28/21 1226 18     Temp 04/28/21 1226 98.7 F (37.1 C)     Temp Source 04/28/21 1226 Oral     SpO2 04/28/21 1226 96 %     Weight --      Height --      Head Circumference --      Peak Flow --      Pain Score 04/28/21 1224 0     Pain Loc --      Pain Edu? --      Excl. in [place]? --    No data found.  Updated Vital Signs BP 119/86 (BP Location: Left Arm)   Pulse 96   Temp 98.7 F (37.1 C) (Oral)   Resp 18   SpO2 96%   Visual Acuity Right Eye Distance:   Left Eye Distance:   Bilateral Distance:    Right Eye Near:   Left Eye Near:    Bilateral Near:     Physical Exam Vitals and nursing note reviewed.  Constitutional:      General: He is not in acute distress.    Appearance: He is not ill-appearing.  Cardiovascular:     Rate and Rhythm: Normal rate and regular rhythm.     Pulses: Normal pulses.     Heart sounds: Normal heart sounds.  Pulmonary:     Effort: Pulmonary effort is normal.     Breath sounds: Normal breath sounds.  Abdominal:     General: Bowel sounds are normal.     Palpations: Abdomen is soft.  Neurological:     Mental Status: He is alert.     UC Treatments / Results  Labs (all labs ordered are listed, but only abnormal results are displayed) Labs Reviewed - No data to display  EKG   Radiology No results found.  Procedures Procedures (including critical care time)  Medications Ordered in UC Medications - No data to display  Initial Impression / Assessment and Plan / UC Course  I have reviewed the triage vital signs and the nursing notes.  Pertinent labs & imaging results that were available during my care of the patient were reviewed by me and considered in my medical decision making (see chart for details).     1.  COVID-19 infection: Tessalon  Perles as needed for cough Increase oral fluid intake Continue ibuprofen as needed for body aches Return to urgent care if symptoms worsen. No indication for imaging at this time. Final Clinical Impressions(s) / UC Diagnoses  Final diagnoses:  [id number] virus infection     Discharge Instructions      Increase oral fluid intake Ibuprofen as needed for generalized body aches Take medications as prescribed Return to urgent care if symptoms worsen .   ED Prescriptions     Medication Sig Dispense Auth. Provider   benzonatate (TESSALON) 100 MG capsule Take 1 capsule (100 mg total) by mouth every 8 (eight) hours. 21 capsule Brynnly Bonet, Myrene Galas, MD      PDMP not reviewed this encounter.   Chase Picket, MD 04/28/21 [id number]

## 2021-04-28 NOTE — ED Triage Notes (Signed)
Pt present positive covid test with sore throat coughing and chills. Symptoms started Thursday.

## 2021-04-28 NOTE — Discharge Instructions (Addendum)
Increase oral fluid intake Ibuprofen as needed for generalized body aches Take medications as prescribed Return to urgent care if symptoms worsen .

## 2021-05-02 ENCOUNTER — Telehealth: Payer: Self-pay | Admitting: *Deleted

## 2021-05-02 ENCOUNTER — Telehealth: Payer: Medicaid Other | Admitting: Gastroenterology

## 2021-05-02 ENCOUNTER — Telehealth (INDEPENDENT_AMBULATORY_CARE_PROVIDER_SITE_OTHER): Payer: Medicaid Other | Admitting: Gastroenterology

## 2021-05-02 ENCOUNTER — Encounter: Payer: Self-pay | Admitting: Gastroenterology

## 2021-05-02 ENCOUNTER — Other Ambulatory Visit: Payer: Self-pay

## 2021-05-02 DIAGNOSIS — K2100 Gastro-esophageal reflux disease with esophagitis, without bleeding: Secondary | ICD-10-CM | POA: Diagnosis not present

## 2021-05-02 NOTE — Patient Instructions (Addendum)
Continue Dexilant 60 mg daily before breakfast. Upper endoscopy as scheduled.  Please see separate instructions.  Food Choices for Gastroesophageal Reflux Disease, Adult When you have gastroesophageal reflux disease (GERD), the foods you eat and your eating habits are very important. Choosing the right foods can help ease the discomfort of GERD. Consider working with a dietitian to help you [employer] choices. What are tips for following this plan? Reading food labels Look for foods that are low in saturated fat. Foods that have less than 5% of daily value (DV) of fat and 0 g of trans fats may help with your symptoms. Cooking Cook foods using methods other than frying. This may include baking, steaming, grilling, or broiling. These are all methods that do not need a lot of fat for cooking. To add flavor, try to use herbs that are low in spice and acidity. Meal planning  Choose healthy foods that are low in fat, such as fruits, vegetables, whole grains, low-fat dairy products, lean meats, fish, and poultry. Eat frequent, small meals instead of three large meals each day. Eat your meals slowly, in a relaxed setting. Avoid bending over or lying down until 2-3 hours after eating. Limit high-fat foods such as fatty meats or fried foods. Limit your intake of fatty foods, such as oils, butter, and shortening. Avoid the following as told by your health care provider: Foods that cause symptoms. These may be different for different people. Keep a food diary to keep track of foods that cause symptoms. Alcohol. Drinking large amounts of liquid with meals. Eating meals during the 2-3 hours before bed.  Lifestyle Maintain a healthy weight. Ask your health care provider what weight is healthy for you. If you need to lose weight, work with your health care provider to do so safely. Exercise for at least 30 minutes on 5 or more days each week, or as told by your health care provider. Avoid wearing  clothes that fit tightly around your waist and chest. Do not use any products that contain nicotine or tobacco. These products include cigarettes, chewing tobacco, and vaping devices, such as e-cigarettes. If you need help quitting, ask your health care provider. Sleep with the head of your bed raised. Use a wedge under the mattress or blocks under the bed frame to raise the head of the bed. Chew sugar-free gum after mealtimes. What foods should I eat?  Eat a healthy, well-balanced diet of fruits, vegetables, whole grains, low-fat dairy products, lean meats, fish, and poultry. Each person is different. Foods that may trigger symptoms in one person may not trigger any symptoms in another person. Work with your health care provider to identify foods that are safe foryou. The items listed above may not be a complete list of recommended foods and beverages. Contact a dietitian for more information. What foods should I avoid? Limiting some of these foods may help manage the symptoms of GERD. Everyone is different. Consult a dietitian or your health care provider to help youidentify the exact foods to avoid, if any. Fruits Any fruits prepared with added fat. Any fruits that cause symptoms. For some people this may include citrus fruits, such as oranges, grapefruit, pineapple,and lemons. Vegetables Deep-fried vegetables. [place] fries. Any vegetables prepared with added fat. Any vegetables that cause symptoms. For some people, this may include tomatoesand tomato products, chili peppers, onions and garlic, and horseradish. Grains Pastries or quick breads with added fat. Meats and other proteins High-fat meats, such as fatty beef or pork,  hot dogs, ribs, ham, sausage, salami, and bacon. Fried meat or protein, including fried fish and friedchicken. Nuts and nut butters, in large amounts. Dairy Whole milk and chocolate milk. Sour cream. Cream. Ice cream. Cream cheese.Milkshakes. Fats and oils Butter.  Margarine. Shortening. Ghee. Beverages Coffee and tea, with or without caffeine. Carbonated beverages. Sodas. Energy drinks. Fruit juice made with acidic fruits, such as orange or grapefruit.Tomato juice. Alcoholic drinks. Sweets and desserts Chocolate and cocoa. Donuts. Seasonings and condiments Pepper. Peppermint and spearmint. Added salt. Any condiments, herbs, or seasonings that cause symptoms. For some people, this may include curry, hotsauce, or vinegar-based salad dressings. The items listed above may not be a complete list of foods and beverages to avoid. Contact a dietitian for more information. Questions to ask your health care provider Diet and lifestyle changes are usually the first steps that are taken to manage symptoms of GERD. If diet and lifestyle changes do not improve your symptoms,talk with your health care provider about taking medicines. Where to find more information International [employer] for Gastrointestinal Disorders: aboutgerd.org Summary When you have gastroesophageal reflux disease (GERD), food and lifestyle choices may be very helpful in easing the discomfort of GERD. Eat frequent, small meals instead of three large meals each day. Eat your meals slowly, in a relaxed setting. Avoid bending over or lying down until 2-3 hours after eating. Limit high-fat foods such as fatty meats or fried foods. This information is not intended to replace advice given to you by your health care provider. Make sure you discuss any questions you have with your healthcare provider. Document Revised: 03/06/2020 Document Reviewed: 03/06/2020 Elsevier Patient Education  [place].    Gastroesophageal Reflux Disease, Adult Gastroesophageal reflux (GER) happens when acid from the stomach flows up into the tube that connects the mouth and the stomach (esophagus). Normally, food travels down the esophagus and stays in the stomach to be digested. However, when a person has GER, food  and stomach acid sometimes move back up into the esophagus. If this becomes a more serious problem, the person may be diagnosed with a disease called gastroesophageal reflux disease (GERD). GERD occurs when the reflux: Happens often. Causes frequent or severe symptoms. Causes problems such as damage to the esophagus. When stomach acid comes in contact with the esophagus, the acid may cause inflammation in the esophagus. Over time, GERD may create small holes (ulcers) in the lining of the esophagus. What are the causes? This condition is caused by a problem with the muscle between the esophagus and the stomach (lower esophageal sphincter, or LES). Normally, the LES muscle closes after food passes through the esophagus to the stomach. When the LES is weakened or abnormal, it does not close properly, and that allows food and stomach acid to go back up into theesophagus. The LES can be weakened by certain dietary substances, medicines, and medical conditions, including: Tobacco use. Pregnancy. Having a hiatal hernia. Alcohol use. Certain foods and beverages, such as coffee, chocolate, onions, and peppermint. What increases the risk? You are more likely to develop this condition if you: Have an increased body weight. Have a connective tissue disorder. Take NSAIDs, such as ibuprofen. What are the signs or symptoms? Symptoms of this condition include: Heartburn. Difficult or painful swallowing and the feeling of having a lump in the throat. A bitter taste in the mouth. Bad breath and having a large amount of saliva. Having an upset or bloated stomach and belching. Chest pain. Different conditions can cause  chest pain. Make sure you see your health care provider if you experience chest pain. Shortness of breath or wheezing. Ongoing (chronic) cough or a nighttime cough. Wearing away of tooth enamel. Weight loss. How is this diagnosed? This condition may be diagnosed based on a medical history  and a physical exam. To determine if you have mild or severe GERD, your health care provider may also monitor how you respond to treatment. You may also have tests, including: A test to examine your stomach and esophagus with a small camera (endoscopy). A test that measures the acidity level in your esophagus. A test that measures how much pressure is on your esophagus. A barium swallow or modified barium swallow test to show the shape, size, and functioning of your esophagus. How is this treated? Treatment for this condition may vary depending on how severe your symptoms are. Your health care provider may recommend: Changes to your diet. Medicine. Surgery. The goal of treatment is to help relieve your symptoms and to preventcomplications. Follow these instructions at home: Eating and drinking  Follow a diet as recommended by your health care provider. This may involve avoiding foods and drinks such as: Coffee and tea, with or without caffeine. Drinks that contain alcohol. Energy drinks and sports drinks. Carbonated drinks or sodas. Chocolate and cocoa. Peppermint and mint flavorings. Garlic and onions. Horseradish. Spicy and acidic foods, including peppers, chili powder, curry powder, vinegar, hot sauces, and barbecue sauce. Citrus fruit juices and citrus fruits, such as oranges, lemons, and limes. Tomato-based foods, such as red sauce, chili, salsa, and pizza with red sauce. Fried and fatty foods, such as donuts, french fries, potato chips, and high-fat dressings. High-fat meats, such as hot dogs and fatty cuts of red and white meats, such as rib eye steak, sausage, ham, and bacon. High-fat dairy items, such as whole milk, butter, and cream cheese. Eat small, frequent meals instead of large meals. Avoid drinking large amounts of liquid with your meals. Avoid eating meals during the 2-3 hours before bedtime. Avoid lying down right after you eat. Do not exercise right after you  eat.  Lifestyle  Do not use any products that contain nicotine or tobacco. These products include cigarettes, chewing tobacco, and vaping devices, such as e-cigarettes. If you need help quitting, ask your health care provider. Try to reduce your stress by using methods such as yoga or meditation. If you need help reducing stress, ask your health care provider. If you are overweight, reduce your weight to an amount that is healthy for you. Ask your health care provider for guidance about a safe weight loss goal.  General instructions Pay attention to any changes in your symptoms. Take over-the-counter and prescription medicines only as told by your health care provider. Do not take aspirin, ibuprofen, or other NSAIDs unless your health care provider told you to take these medicines. Wear loose-fitting clothing. Do not wear anything tight around your waist that causes pressure on your abdomen. Raise (elevate) the head of your bed about 6 inches (15 cm). You can use a wedge to do this. Avoid bending over if this makes your symptoms worse. Keep all follow-up visits. This is important. Contact a health care provider if: You have: New symptoms. Unexplained weight loss. Difficulty swallowing or it hurts to swallow. Wheezing or a persistent cough. A hoarse voice. Your symptoms do not improve with treatment. Get help right away if: You have sudden pain in your arms, neck, jaw, teeth, or back. You suddenly  feel sweaty, dizzy, or light-headed. You have chest pain or shortness of breath. You vomit and the vomit is green, yellow, or black, or it looks like blood or coffee grounds. You faint. You have stool that is red, bloody, or black. You cannot swallow, drink, or eat. These symptoms may represent a serious problem that is an emergency. Do not wait to see if the symptoms will go away. Get medical help right away. Call your local emergency services (911 in the [place] Do not drive yourself to the  hospital. Summary Gastroesophageal reflux happens when acid from the stomach flows up into the esophagus. GERD is a disease in which the reflux happens often, causes frequent or severe symptoms, or causes problems such as damage to the esophagus. Treatment for this condition may vary depending on how severe your symptoms are. Your health care provider may recommend diet and lifestyle changes, medicine, or surgery. Contact a health care provider if you have new or worsening symptoms. Take over-the-counter and prescription medicines only as told by your health care provider. Do not take aspirin, ibuprofen, or other NSAIDs unless your health care provider told you to do so. Keep all follow-up visits as told by your health care provider. This is important. This information is not intended to replace advice given to you by your health care provider. Make sure you discuss any questions you have with your healthcare provider. Document Revised: 03/06/2020 Document Reviewed: 03/06/2020 Elsevier Patient Education  [place].

## 2021-05-02 NOTE — Telephone Encounter (Signed)
Christopher Ahr., you are scheduled for a virtual visit with your provider today.  Just as we do with appointments in the office, we must obtain your consent to participate.  Your consent will be active for this visit and any virtual visit you may have with one of our providers in the next 365 days.  If you have a MyChart account, I can also send a copy of this consent to you electronically.  All virtual visits are billed to your insurance company just like a traditional visit in the office.  As this is a virtual visit, video technology does not allow for your provider to perform a traditional examination.  This may limit your provider's ability to fully assess your condition.  If your provider identifies any concerns that need to be evaluated in person or the need to arrange testing such as labs, EKG, etc, we will make arrangements to do so.  Although advances in technology are sophisticated, we cannot ensure that it will always work on either your end or our end.  If the connection with a video visit is poor, we may have to switch to a telephone visit.  With either a video or telephone visit, we are not always able to ensure that we have a secure connection.   I need to obtain your verbal consent now.   Are you willing to proceed with your visit today?

## 2021-05-02 NOTE — Progress Notes (Signed)
Primary Care Physician:  Lindell Spar, MD Primary GI:  Garfield Cornea, MD   Patient Location: Home  Provider Location: [facility] office  Reason for Visit:  Chief Complaint  Patient presents with   Diarrhea    Much better, none now. Related to covid   Gastroesophageal Reflux    F/u. Doing okay.      Persons present on the virtual encounter, with roles: Patient, myself (provider), Mindy Estudillo, CMA (updated meds and allergies)  Total time (minutes) spent on medical discussion: 14 minutes  Due to COVID-19, visit was conducted using Mychart video method.  Visit was requested by patient.  Virtual Visit via Mychart Video  I connected with Christopher Wyatt. on 05/02/21 at  3:30 PM EDT by Mychart video and verified that I am speaking with the correct person using two identifiers.   I discussed the limitations, risks, security and privacy concerns of performing an evaluation and management service by telephone/video and the availability of in person appointments. I also discussed with the patient that there may be a patient responsible charge related to this service. The patient expressed understanding and agreed to proceed.   HPI:   Christopher Wyatt. is a 45 y.o. male who presents for virtual visit regarding GERD, diarrhea.   Patient last seen February 2022 for reflux, diarrhea.  Diarrhea previously felt to be related to Zoloft.  Historically GERD has been difficult to manage having failed over-the-counter medications, omeprazole 40 mg daily, pantoprazole 40 mg twice daily.  EGD December 2021: Severe ulcerative/erosive reflux esophagitis without Barrett's, medium size hiatal hernia, otherwise normal.  1 year follow-up EGD recommended.  Doing well from GI standpoint. Diagnosed with covid over the weekend. Initially had diarrhea but this has resolved. BM now regular. No melena, brbpr. Dexilant is helping a lot with reflux. Some days forgets in the mornings but by the  evenings will have a lot of heartburn. No dysphagia.     Current Outpatient Medications  Medication Sig Dispense Refill   dexlansoprazole (DEXILANT) 60 MG capsule TAKE 1 CAPSULE(60 MG) BY MOUTH DAILY 90 capsule 3   HYDROcodone-acetaminophen (NORCO/VICODIN) 5-325 MG tablet Take 1 tablet by mouth every 4 (four) hours as needed for up to 5 doses. 5 tablet 0   ibuprofen (ADVIL) 800 MG tablet Take 1 tablet (800 mg total) by mouth 3 (three) times daily. (Patient taking differently: Take 800 mg by mouth 3 (three) times daily. As needed) 21 tablet 0   traMADol (ULTRAM) 50 MG tablet Take 1 tablet (50 mg total) by mouth every 6 (six) hours as needed. 10 tablet 0   No current facility-administered medications for this visit.    Past Medical History:  Diagnosis Date   Anxiety    Chronic   GERD (gastroesophageal reflux disease)    Headache    Neuroma    OCD (obsessive compulsive disorder)     Past Surgical History:  Procedure Laterality Date   CIRCUMCISION  2004   ESOPHAGOGASTRODUODENOSCOPY (EGD) WITH PROPOFOL N/A 08/10/2020   Procedure: ESOPHAGOGASTRODUODENOSCOPY (EGD) WITH PROPOFOL;  Surgeon: Daneil Dolin, MD;  Location: [facility];  Service: Endoscopy;  Laterality: N/A;  1:45pm   EXCISION MORTON'S NEUROMA  10/16/2020   Procedure: EXCISION MORTON'S NEUROMA,AND REMOVAL OF INGROWN NAIL-GREAT TOES LEFT;  Surgeon: Felipa Furnace, DPM;  Location: [facility];  Service: Podiatry;;   WISDOM TOOTH EXTRACTION  2011    Family History  Problem Relation Age of Onset  Depression Mother    Anxiety disorder Mother    Hypertension Father    Depression Sister    Throat cancer Maternal Grandmother    Alcohol abuse Maternal Uncle    Bipolar disorder Paternal Uncle    Schizophrenia Paternal Uncle    Colon cancer Neg Hx     Social History   Socioeconomic History   Marital status: Significant Other    Spouse name: Not on file   Number of children: 1   Years of education: 12    Highest education level: High school graduate  Occupational History   Occupation: local truck driver  Tobacco Use   Smoking status: Every Day    Packs/day: 0.50    Years: 4.00    Pack years: 2.00    Types: Cigarettes   Smokeless tobacco: Former    Types: Snuff  Vaping Use   Vaping Use: Never used  Substance and Sexual Activity   Alcohol use: Yes    Comment: 1-2 glasses of wine  few x week   Drug use: No   Sexual activity: Yes  Other Topics Concern   Not on file  Social History Narrative   Lives at home, along with his son and parents.   Left-sided.   1-3 cup coffee, occasional soda.   Social Determinants of Health   Financial Resource Strain: Not on file  Food Insecurity: Not on file  Transportation Needs: Not on file  Physical Activity: Not on file  Stress: Not on file  Social Connections: Not on file  Intimate Partner Violence: Not on file      ROS:  General: Negative for anorexia, weight loss, fever, chills, fatigue, weakness. Eyes: Negative for vision changes.  ENT: Negative for hoarseness, difficulty swallowing , nasal congestion. CV: Negative for chest pain, angina, palpitations, dyspnea on exertion, peripheral edema.  Respiratory: Negative for dyspnea at rest, dyspnea on exertion, cough, sputum, wheezing.  GI: See history of present illness. GU:  Negative for dysuria, hematuria, urinary incontinence, urinary frequency, nocturnal urination.  MS: Negative for joint pain, low back pain.  Derm: Negative for rash or itching.  Neuro: Negative for weakness, abnormal sensation, seizure, frequent headaches, memory loss, confusion.  Psych: Negative for anxiety, depression, suicidal ideation, hallucinations.  Endo: Negative for unusual weight change.  Heme: Negative for bruising or bleeding. Allergy: Negative for rash or hives.   Observations/Objective: Pleasant well nourished, well developed male in NAD  Assessment and Plan: Severe ulcerative/reflux esophagitis:  Noted at time of EGD December 2021.  Patient clinically doing well as long as he takes Dexilant 60 mg daily.  No alarm symptoms.  Plans of updating EGD this year to verify esophageal healing and rule out underlying Barrett's.    EGD with propofol. ASA II.  I have discussed the risks, alternatives, benefits with regards to but not limited to the risk of reaction to medication, bleeding, infection, perforation and the patient is agreeable to proceed. Written consent to be obtained. Continue Dexilant 60 mg daily.   Reinforced antireflux measures.    Follow Up Instructions:    I discussed the assessment and treatment plan with the patient. The patient was provided an opportunity to ask questions and all were answered. The patient agreed with the plan and demonstrated an understanding of the instructions. AVS mailed to patient's home address.   The patient was advised to call back or seek an in-person evaluation if the symptoms worsen or if the condition fails to improve as anticipated.  I provided  14 minutes of virtual face-to-face time during this encounter.   Neil Crouch, PA-C

## 2021-05-02 NOTE — Telephone Encounter (Signed)
Pt consented to a virtual visit. 

## 2021-05-03 ENCOUNTER — Ambulatory Visit: Payer: Medicaid Other | Admitting: Nurse Practitioner

## 2021-05-10 ENCOUNTER — Telehealth: Payer: Self-pay

## 2021-05-10 NOTE — Telephone Encounter (Signed)
Tried to call pt to schedule EGD w/Propofol ASA 2 w/Dr. Gala Romney, LMOVM for return call.

## 2021-05-10 NOTE — Telephone Encounter (Signed)
Spoke to pt, EGD scheduled for 06/11/21 at 10:30am. Orders entered. Instructions mailed.

## 2021-05-24 DIAGNOSIS — M542 Cervicalgia: Secondary | ICD-10-CM | POA: Diagnosis not present

## 2021-05-30 ENCOUNTER — Emergency Department (HOSPITAL_COMMUNITY)
Admission: EM | Admit: 2021-05-30 | Discharge: 2021-05-30 | Disposition: A | Discharge status: Home / Self Care | Payer: Medicaid Other | Attending: Emergency Medicine | Admitting: Emergency Medicine

## 2021-05-30 ENCOUNTER — Encounter (HOSPITAL_COMMUNITY): Payer: Self-pay | Admitting: Emergency Medicine

## 2021-05-30 ENCOUNTER — Other Ambulatory Visit: Payer: Self-pay

## 2021-05-30 ENCOUNTER — Emergency Department (HOSPITAL_COMMUNITY): Payer: Medicaid Other

## 2021-05-30 DIAGNOSIS — R1013 Epigastric pain: Secondary | ICD-10-CM | POA: Diagnosis not present

## 2021-05-30 DIAGNOSIS — R0789 Other chest pain: Secondary | ICD-10-CM | POA: Insufficient documentation

## 2021-05-30 DIAGNOSIS — K219 Gastro-esophageal reflux disease without esophagitis: Secondary | ICD-10-CM | POA: Insufficient documentation

## 2021-05-30 DIAGNOSIS — F17210 Nicotine dependence, cigarettes, uncomplicated: Secondary | ICD-10-CM | POA: Insufficient documentation

## 2021-05-30 DIAGNOSIS — R079 Chest pain, unspecified: Secondary | ICD-10-CM | POA: Diagnosis not present

## 2021-05-30 LAB — TROPONIN I (HIGH SENSITIVITY)
Troponin I (High Sensitivity): 6 ng/L (ref <18)
Troponin I (High Sensitivity): 6 ng/L (ref <18)

## 2021-05-30 LAB — COMPREHENSIVE METABOLIC PANEL
ALT: 26 U/L (ref 0–44)
AST: 28 U/L (ref 15–41)
Albumin: 4.3 g/dL (ref 3.5–5.0)
Alkaline Phosphatase: 63 U/L (ref 38–126)
Anion gap: 8 (ref 5–15)
BUN: 9 mg/dL (ref 6–20)
CO2: 30 mmol/L (ref 22–32)
Calcium: 9.5 mg/dL (ref 8.9–10.3)
Chloride: 99 mmol/L (ref 98–111)
Creatinine, Ser: 0.83 mg/dL (ref 0.61–1.24)
GFR, Estimated: >60 mL/min (ref >60)
Glucose, Bld: 127 mg/dL — ABNORMAL HIGH (ref 70–99)
Potassium: 3.2 mmol/L — ABNORMAL LOW (ref 3.5–5.1)
Sodium: 137 mmol/L (ref 135–145)
Total Bilirubin: 0.6 mg/dL (ref 0.3–1.2)
Total Protein: 7.4 g/dL (ref 6.5–8.1)

## 2021-05-30 LAB — CBC WITH DIFFERENTIAL/PLATELET
Abs Immature Granulocytes: 0.01 10*3/uL (ref 0.00–0.07)
Basophils Absolute: 0.1 10*3/uL (ref 0.0–0.1)
Basophils Relative: 1 %
Eosinophils Absolute: 0.2 10*3/uL (ref 0.0–0.5)
Eosinophils Relative: 3 %
HCT: 52.5 % — ABNORMAL HIGH (ref 39.0–52.0)
Hemoglobin: 17.6 g/dL — ABNORMAL HIGH (ref 13.0–17.0)
Immature Granulocytes: 0 %
Lymphocytes Relative: 35 %
Lymphs Abs: 3 10*3/uL (ref 0.7–4.0)
MCH: 31 pg (ref 26.0–34.0)
MCHC: 33.5 g/dL (ref 30.0–36.0)
MCV: 92.4 fL (ref 80.0–100.0)
Monocytes Absolute: 0.8 10*3/uL (ref 0.1–1.0)
Monocytes Relative: 9 %
Neutro Abs: 4.6 10*3/uL (ref 1.7–7.7)
Neutrophils Relative %: 52 %
Platelets: 214 10*3/uL (ref 150–400)
RBC: 5.68 MIL/uL (ref 4.22–5.81)
RDW: 14.1 % (ref 11.5–15.5)
WBC: 8.8 10*3/uL (ref 4.0–10.5)
nRBC: 0 % (ref 0.0–0.2)

## 2021-05-30 LAB — LIPASE, BLOOD: Lipase: 29 U/L (ref 11–51)

## 2021-05-30 MED ORDER — LIDOCAINE VISCOUS HCL 2 % MT SOLN
15.0000 mL | Freq: Once | OROMUCOSAL | Status: DC
  Filled 2021-05-30: qty 15

## 2021-05-30 MED ORDER — ALUM & MAG HYDROXIDE-SIMETH 200-200-20 MG/5ML PO SUSP
30.0000 mL | Freq: Once | ORAL | Status: AC
  Administered 2021-05-30: 30 mL via ORAL
  Filled 2021-05-30: qty 30

## 2021-05-30 NOTE — ED Triage Notes (Signed)
Pt c/o sharp epigastric pain since 2130.

## 2021-05-30 NOTE — ED Provider Notes (Signed)
[facility] EMERGENCY DEPARTMENT Provider Note   CSN: [id number] Arrival date & time: 05/30/21  0402     History Chief Complaint  Patient presents with   Abdominal Pain    Christopher Wyatt. is a 45 y.o. male.  Patient presents to the emergency department for evaluation of abdominal and chest pain.  Patient reports that pain began this evening.  Patient reports that he suddenly felt a sharp pain just at the bottom of his sternum earlier and it has been persistent.  He denies any associated cough, fever, shortness of breath.  Patient reports that the area is tender to the touch.  He does have a history of severe reflux, but since he has started on Dexilant this has resolved.  Pain today is different than his reflux pain.   HPI: A 44 year old patient presents for evaluation of chest pain. Initial onset of pain was more than 6 hours ago. The patient's chest pain is sharp and is not worse with exertion. The patient's chest pain is middle- or left-sided, is not well-localized, is not described as heaviness/pressure/tightness and does not radiate to the arms/jaw/neck. The patient does not complain of nausea and denies diaphoresis. The patient has no history of stroke, has no history of peripheral artery disease, has not smoked in the past 90 days, denies any history of treated diabetes, has no relevant family history of coronary artery disease (first degree relative at less than age 81), is not hypertensive, has no history of hypercholesterolemia and does not have an elevated BMI (>=30).   Past Medical History:  Diagnosis Date   Anxiety    Chronic   GERD (gastroesophageal reflux disease)    Headache    Neuroma    OCD (obsessive compulsive disorder)     Patient Active Problem List   Diagnosis Date Noted   Vertigo 01/12/2021   Pre-op exam 09/20/2020   Erosive esophagitis 08/11/2020   Morton's neuroma of left foot 08/11/2020   Diarrhea 08/01/2020   Neck pain 06/27/2020   Nicotine  dependence 06/13/2020   Cervical pain (neck) 06/13/2020   Alcohol abuse 04/20/2020   GERD (gastroesophageal reflux disease) 10/12/2019   Mixed obsessional thoughts and acts 09/27/2019   Mixed anxiety and depressive disorder 02/05/2019   Nephrolithiasis 01/27/2014   Tobacco use disorder 11/05/2013   OCD (obsessive compulsive disorder)     Past Surgical History:  Procedure Laterality Date   CIRCUMCISION  2004   ESOPHAGOGASTRODUODENOSCOPY (EGD) WITH PROPOFOL N/A 08/10/2020   Procedure: ESOPHAGOGASTRODUODENOSCOPY (EGD) WITH PROPOFOL;  Surgeon: Daneil Dolin, MD;  Location: [facility];  Service: Endoscopy;  Laterality: N/A;  1:45pm   EXCISION MORTON'S NEUROMA  10/16/2020   Procedure: EXCISION MORTON'S NEUROMA,AND REMOVAL OF INGROWN NAIL-GREAT TOES LEFT;  Surgeon: Felipa Furnace, DPM;  Location: [place];  Service: Podiatry;;   WISDOM TOOTH EXTRACTION  2011       Family History  Problem Relation Age of Onset   Depression Mother    Anxiety disorder Mother    Hypertension Father    Depression Sister    Throat cancer Maternal Grandmother    Alcohol abuse Maternal Uncle    Bipolar disorder Paternal Uncle    Schizophrenia Paternal Uncle    Colon cancer Neg Hx     Social History   Tobacco Use   Smoking status: Every Day    Packs/day: 0.50    Years: 4.00    Pack years: 2.00    Types: Cigarettes  Smokeless tobacco: Former    Types: Snuff  Vaping Use   Vaping Use: Never used  Substance Use Topics   Alcohol use: Yes    Comment: 1-2 glasses of wine  few x week   Drug use: No    Home Medications Prior to Admission medications   Medication Sig Start Date End Date Taking? Authorizing Provider  dexlansoprazole (DEXILANT) 60 MG capsule TAKE 1 CAPSULE(60 MG) BY MOUTH DAILY 02/01/21   Annitta Needs, NP  HYDROcodone-acetaminophen (NORCO/VICODIN) 5-325 MG tablet Take 1 tablet by mouth every 4 (four) hours as needed for up to 5 doses. 02/12/21   Garald Balding, PA-C   ibuprofen (ADVIL) 800 MG tablet Take 1 tablet (800 mg total) by mouth 3 (three) times daily. Patient taking differently: Take 800 mg by mouth 3 (three) times daily. As needed 02/12/21   Garald Balding, PA-C  traMADol (ULTRAM) 50 MG tablet Take 1 tablet (50 mg total) by mouth every 6 (six) hours as needed. 02/08/21   Rodriguez-Southworth, Sunday Spillers, PA-C  sertraline (ZOLOFT) 100 MG tablet 50 mg daily for four days, then 100 mg daily for four days, then 150 mg daily 12/27/20 02/08/21  Norman Clay, MD    Allergies    Paroxetine hcl  Review of Systems   Review of Systems  Cardiovascular:  Positive for chest pain.  Gastrointestinal:  Positive for abdominal pain.  All other systems reviewed and are negative.  Physical Exam Updated Vital Signs BP 124/86   Pulse 73   Temp 97.8 F (36.6 C)   Resp (!) 22   Ht 6' (1.829 m)   Wt 80.7 kg   SpO2 94%   BMI 24.14 kg/m   Physical Exam Vitals and nursing note reviewed.  Constitutional:      General: He is not in acute distress.    Appearance: Normal appearance. He is well-developed.  HENT:     Head: Normocephalic and atraumatic.     Right Ear: Hearing normal.     Left Ear: Hearing normal.     Nose: Nose normal.  Eyes:     Conjunctiva/sclera: Conjunctivae normal.     Pupils: Pupils are equal, round, and reactive to light.  Cardiovascular:     Rate and Rhythm: Regular rhythm.     Heart sounds: S1 normal and S2 normal. No murmur heard.   No friction rub. No gallop.  Pulmonary:     Effort: Pulmonary effort is normal. No respiratory distress.     Breath sounds: Normal breath sounds.  Chest:     Chest wall: Tenderness present.    Abdominal:     General: Bowel sounds are normal.     Palpations: Abdomen is soft.     Tenderness: There is abdominal tenderness in the epigastric area. There is no guarding or rebound. Negative signs include Murphy's sign and McBurney's sign.     Hernia: No hernia is present.  Musculoskeletal:        General:  Normal range of motion.     Cervical back: Normal range of motion and neck supple.  Skin:    General: Skin is warm and dry.     Findings: No rash.  Neurological:     Mental Status: He is alert and oriented to person, place, and time.     GCS: GCS eye subscore is 4. GCS verbal subscore is 5. GCS motor subscore is 6.     Cranial Nerves: No cranial nerve deficit.     Sensory: No  sensory deficit.     Coordination: Coordination normal.  Psychiatric:        Speech: Speech normal.        Behavior: Behavior normal.        Thought Content: Thought content normal.    ED Results / Procedures / Treatments   Labs (all labs ordered are listed, but only abnormal results are displayed) Labs Reviewed  CBC WITH DIFFERENTIAL/PLATELET - Abnormal; Notable for the following components:      Result Value   Hemoglobin 17.6 (*)    HCT 52.5 (*)    All other components within normal limits  COMPREHENSIVE METABOLIC PANEL - Abnormal; Notable for the following components:   Potassium 3.2 (*)    Glucose, Bld 127 (*)    All other components within normal limits  LIPASE, BLOOD  TROPONIN I (HIGH SENSITIVITY)  TROPONIN I (HIGH SENSITIVITY)    EKG EKG Interpretation  Date/Time:  Wednesday May 30 2021 04:40:57 EDT Ventricular Rate:  77 PR Interval:  157 QRS Duration: 105 QT Interval:  388 QTC Calculation: 440 R Axis:   89 Text Interpretation: Sinus rhythm Normal ECG Confirmed by Orpah Greek [phone]) on 05/30/2021 4:54:04 AM  Radiology DG Chest Port 1 View  Result Date: 05/30/2021 CLINICAL DATA:  Chest pain EXAM: PORTABLE CHEST 1 VIEW COMPARISON:  06/11/2018 FINDINGS: The heart size and mediastinal contours are within normal limits. Both lungs are clear. The visualized skeletal structures are unremarkable. IMPRESSION: No active disease. Electronically Signed   By: Kerby Moors M.D.   On: 05/30/2021 06:06    Procedures Procedures   Medications Ordered in ED Medications  alum & mag  hydroxide-simeth (MAALOX/MYLANTA) 200-200-20 MG/5ML suspension 30 mL (30 mLs Oral Given 05/30/21 0600)    And  lidocaine (XYLOCAINE) 2 % viscous mouth solution 15 mL (15 mLs Oral Not Given 05/30/21 0600)    ED Course  I have reviewed the triage vital signs and the nursing notes.  Pertinent labs & imaging results that were available during my care of the patient were reviewed by me and considered in my medical decision making (see chart for details).    MDM Rules/Calculators/A&P HEAR Score: 0                        Patient presents with chest and abdominal pain.  Patient reports sudden onset of a sharp pain in the area of his lower sternal border that occurred while at rest.  Patient reports some pain with movement.  Examination reveals significant tenderness over the lower sternal border/xiphoid process region.  He is not experiencing any pulmonary symptoms.  No tachycardia, tachypnea, hypoxia.  No PE risk factors.  PERC negative.  Patient has had pain for more than 6 hours.  He has no cardiac risk factors.  EKG is normal.  Troponin negative.  No further work-up necessary.  He does have a significant GI history.  Continue his Dexilant, follow-up with PCP and gastroenterology.  He has EGD scheduled.   Final Clinical Impression(s) / ED Diagnoses Final diagnoses:  Chest wall pain    Rx / DC Orders ED Discharge Orders     None        Orpah Greek, MD 05/30/21 [phone]

## 2021-05-31 ENCOUNTER — Telehealth: Payer: Self-pay

## 2021-05-31 DIAGNOSIS — M542 Cervicalgia: Secondary | ICD-10-CM | POA: Diagnosis not present

## 2021-05-31 NOTE — Telephone Encounter (Signed)
Transition Care Management Unsuccessful Follow-up Telephone Call  Date of discharge and from where:  05/30/2021-[place]   Attempts:  1st Attempt  Reason for unsuccessful TCM follow-up call:  Left voice message

## 2021-06-04 NOTE — Telephone Encounter (Signed)
Transition Care Management Follow-up Telephone Call Date of discharge and from where: 05/30/2021 from [facility] How have you been since you were released from the hospital? Pt stated that he is feeling better and did not have any questions or concerns.  Any questions or concerns? No  Items Reviewed: Did the pt receive and understand the discharge instructions provided? Yes  Medications obtained and verified? Yes  Other? No  Any new allergies since your discharge? No  Dietary orders reviewed? No Do you have support at home? Yes   Functional Questionnaire: (I = Independent and D = Dependent) ADLs: I  Bathing/Dressing- I  Meal Prep- I  Eating- I  Maintaining continence- I  Transferring/Ambulation- I  Managing Meds- I   Follow up appointments reviewed:  PCP Hospital f/u appt confirmed? No   Specialist Hospital f/u appt confirmed? No   Are transportation arrangements needed? No  If their condition worsens, is the pt aware to call PCP or go to the Emergency Dept.? Yes Was the patient provided with contact information for the PCP's office or ED? Yes Was to pt encouraged to call back with questions or concerns? Yes

## 2021-06-11 ENCOUNTER — Other Ambulatory Visit: Payer: Self-pay

## 2021-06-11 ENCOUNTER — Ambulatory Visit (HOSPITAL_COMMUNITY)
Admission: RE | Admit: 2021-06-11 | Discharge: 2021-06-11 | Disposition: A | Discharge status: Home / Self Care | Payer: Medicaid Other | Attending: Internal Medicine | Admitting: Internal Medicine

## 2021-06-11 ENCOUNTER — Ambulatory Visit (HOSPITAL_COMMUNITY): Payer: Medicaid Other | Admitting: Anesthesiology

## 2021-06-11 ENCOUNTER — Telehealth: Payer: Self-pay | Admitting: *Deleted

## 2021-06-11 ENCOUNTER — Encounter (HOSPITAL_COMMUNITY): Payer: Self-pay | Admitting: Internal Medicine

## 2021-06-11 ENCOUNTER — Encounter (HOSPITAL_COMMUNITY)
Admission: RE | Disposition: A | Discharge status: Home / Self Care | Payer: Self-pay | Source: Home / Self Care | Attending: Internal Medicine

## 2021-06-11 DIAGNOSIS — Z888 Allergy status to other drugs, medicaments and biological substances status: Secondary | ICD-10-CM | POA: Diagnosis not present

## 2021-06-11 DIAGNOSIS — K2100 Gastro-esophageal reflux disease with esophagitis, without bleeding: Secondary | ICD-10-CM

## 2021-06-11 DIAGNOSIS — K208 Other esophagitis without bleeding: Secondary | ICD-10-CM | POA: Diagnosis not present

## 2021-06-11 DIAGNOSIS — Z79899 Other long term (current) drug therapy: Secondary | ICD-10-CM | POA: Diagnosis not present

## 2021-06-11 DIAGNOSIS — R198 Other specified symptoms and signs involving the digestive system and abdomen: Secondary | ICD-10-CM

## 2021-06-11 DIAGNOSIS — F17210 Nicotine dependence, cigarettes, uncomplicated: Secondary | ICD-10-CM | POA: Insufficient documentation

## 2021-06-11 HISTORY — PX: BIOPSY: SHX5522

## 2021-06-11 HISTORY — PX: ESOPHAGOGASTRODUODENOSCOPY (EGD) WITH PROPOFOL: SHX5813

## 2021-06-11 SURGERY — ESOPHAGOGASTRODUODENOSCOPY (EGD) WITH PROPOFOL
Anesthesia: General

## 2021-06-11 MED ORDER — DEXMEDETOMIDINE (PRECEDEX) IN NS 20 MCG/5ML (4 MCG/ML) IV SYRINGE
PREFILLED_SYRINGE | INTRAVENOUS | Status: AC
  Filled 2021-06-11: qty 5

## 2021-06-11 MED ORDER — LACTATED RINGERS IV SOLN: INTRAVENOUS | Status: DC | PRN

## 2021-06-11 MED ORDER — LIDOCAINE HCL (CARDIAC) PF 100 MG/5ML IV SOSY
PREFILLED_SYRINGE | INTRAVENOUS | Status: DC | PRN
  Administered 2021-06-11: 50 mg via INTRAVENOUS

## 2021-06-11 MED ORDER — PROPOFOL 10 MG/ML IV BOLUS
INTRAVENOUS | Status: DC | PRN
  Administered 2021-06-11 (×2): 100 mg via INTRAVENOUS
  Administered 2021-06-11 (×2): 50 mg via INTRAVENOUS

## 2021-06-11 MED ORDER — ONDANSETRON HCL 4 MG/2ML IJ SOLN
INTRAMUSCULAR | Status: AC
  Filled 2021-06-11: qty 2

## 2021-06-11 MED ORDER — ONDANSETRON HCL 4 MG/2ML IJ SOLN
INTRAMUSCULAR | Status: DC | PRN
  Administered 2021-06-11: 4 mg via INTRAVENOUS

## 2021-06-11 MED ORDER — STERILE WATER FOR IRRIGATION IR SOLN
Status: DC | PRN
  Administered 2021-06-11: 100 mL

## 2021-06-11 MED ORDER — DEXMEDETOMIDINE (PRECEDEX) IN NS 20 MCG/5ML (4 MCG/ML) IV SYRINGE
PREFILLED_SYRINGE | INTRAVENOUS | Status: DC | PRN
  Administered 2021-06-11: 20 ug via INTRAVENOUS

## 2021-06-11 MED ORDER — MIDAZOLAM HCL 2 MG/2ML IJ SOLN
INTRAMUSCULAR | Status: DC | PRN
  Administered 2021-06-11: 2 mg via INTRAVENOUS

## 2021-06-11 MED ORDER — LACTATED RINGERS IV SOLN: INTRAVENOUS | Status: DC

## 2021-06-11 MED ORDER — MIDAZOLAM HCL 2 MG/2ML IJ SOLN
INTRAMUSCULAR | Status: AC
  Filled 2021-06-11: qty 2

## 2021-06-11 NOTE — Anesthesia Preprocedure Evaluation (Signed)
Anesthesia Evaluation  Patient identified by MRN, date of birth, ID band Patient awake    Reviewed: Allergy & Precautions, H&P , NPO status , Patient's Chart, lab work & pertinent test results, reviewed documented beta blocker date and time   Airway Mallampati: II  TM Distance: >3 FB Neck ROM: full    Dental no notable dental hx.    Pulmonary neg pulmonary ROS, Current Smoker and Patient abstained from smoking.,    Pulmonary exam normal breath sounds clear to auscultation       Cardiovascular Exercise Tolerance: Good negative cardio ROS   Rhythm:regular Rate:Normal     Neuro/Psych  Headaches, PSYCHIATRIC DISORDERS Anxiety Depression  Neuromuscular disease    GI/Hepatic Neg liver ROS, PUD, GERD  Medicated,  Endo/Other  negative endocrine ROS  Renal/GU negative Renal ROS  negative genitourinary   Musculoskeletal   Abdominal   Peds  Hematology negative hematology ROS (+)   Anesthesia Other Findings   Reproductive/Obstetrics negative OB ROS                             Anesthesia Physical Anesthesia Plan  ASA: 2  Anesthesia Plan: General   Post-op Pain Management:    Induction:   PONV Risk Score and Plan: Propofol infusion  Airway Management Planned:   Additional Equipment:   Intra-op Plan:   Post-operative Plan:   Informed Consent: I have reviewed the patients History and Physical, chart, labs and discussed the procedure including the risks, benefits and alternatives for the proposed anesthesia with the patient or authorized representative who has indicated his/her understanding and acceptance.     Dental Advisory Given  Plan Discussed with: CRNA  Anesthesia Plan Comments:         Anesthesia Quick Evaluation

## 2021-06-11 NOTE — Telephone Encounter (Signed)
I spoke to the patient. He is aware that his medical records have been submitted to [facility].

## 2021-06-11 NOTE — Discharge Instructions (Addendum)
EGD Discharge instructions Please read the instructions outlined below and refer to this sheet in the next few weeks. These discharge instructions provide you with general information on caring for yourself after you leave the hospital. Your doctor may also give you specific instructions. While your treatment has been planned according to the most current medical practices available, unavoidable complications occasionally occur. If you have any problems or questions after discharge, please call your doctor. ACTIVITY You may resume your regular activity but move at a slower pace for the next 24 hours.  Take frequent rest periods for the next 24 hours.  Walking will help expel (get rid of) the air and reduce the bloated feeling in your abdomen.  No driving for 24 hours (because of the anesthesia (medicine) used during the test).  You may shower.  Do not sign any important legal documents or operate any machinery for 24 hours (because of the anesthesia used during the test).  NUTRITION Drink plenty of fluids.  You may resume your normal diet.  Begin with a light meal and progress to your normal diet.  Avoid alcoholic beverages for 24 hours or as instructed by your caregiver.  MEDICATIONS You may resume your normal medications unless your caregiver tells you otherwise.  WHAT YOU CAN EXPECT TODAY You may experience abdominal discomfort such as a feeling of fullness or "gas" pains.  FOLLOW-UP Your doctor will discuss the results of your test with you.  SEEK IMMEDIATE MEDICAL ATTENTION IF ANY OF THE FOLLOWING OCCUR: Excessive nausea (feeling sick to your stomach) and/or vomiting.  Severe abdominal pain and distention (swelling).  Trouble swallowing.  Temperature over 101 F (37.8 C).  Rectal bleeding or vomiting of blood.    The inflammation in your esophagus looks much better.  You may have some scar tissue.  I did take some biopsies.  Your stomach has a "J shape.  Little unusual.  Did not  see any other abnormality.  Continue taking Dexilant 60 mg every day  We will get an upper GI series to further evaluate abnormal appearing stomach at a later date.(Please schedule) office will notify you!  Office visit with [place] in 3 months   At patient request, called Vernell Sentell at [phone] to [place] a message

## 2021-06-11 NOTE — Anesthesia Postprocedure Evaluation (Signed)
Anesthesia Post Note  Patient: Christopher Wyatt.  Procedure(s) Performed: ESOPHAGOGASTRODUODENOSCOPY (EGD) WITH PROPOFOL BIOPSY  Patient location during evaluation: Phase II Anesthesia Type: General Level of consciousness: awake Pain management: pain level controlled Vital Signs Assessment: post-procedure vital signs reviewed and stable Respiratory status: spontaneous breathing and respiratory function stable Cardiovascular status: blood pressure returned to baseline and stable Postop Assessment: no headache and no apparent nausea or vomiting Anesthetic complications: no Comments: Late entry   No notable events documented.   Last Vitals:  Vitals:   06/11/21 1008 06/11/21 1054  BP: 128/84 109/76  Pulse: 93   Resp: 16 (!) 21  Temp: 36.6 C 36.6 C  SpO2: 99% 96%    Last Pain:  Vitals:   06/11/21 1054  TempSrc: Oral  PainSc: 0-No pain                 Louann Sjogren

## 2021-06-11 NOTE — Telephone Encounter (Signed)
Pt called, need letter stating his medical condition, to help him get short or long term disability with his [employer] insurance. I faxed over his last office note and his mr cervical report.  Please call [phone]

## 2021-06-11 NOTE — Transfer of Care (Signed)
Immediate Anesthesia Transfer of Care Note  Patient: Christopher Wyatt.  Procedure(s) Performed: ESOPHAGOGASTRODUODENOSCOPY (EGD) WITH PROPOFOL BIOPSY  Patient Location: Endoscopy Unit  Anesthesia Type:General  Level of Consciousness: drowsy  Airway & Oxygen Therapy: Patient Spontanous Breathing  Post-op Assessment: Report given to RN and Post -op Vital signs reviewed and stable  Post vital signs: Reviewed and stable  Last Vitals:  Vitals Value Taken Time  BP    Temp    Pulse    Resp    SpO2      Last Pain:  Vitals:   06/11/21 1031  TempSrc:   PainSc: 0-No pain      Patients Stated Pain Goal: 8 (24/09/73 5329)  Complications: No notable events documented.

## 2021-06-11 NOTE — Op Note (Addendum)
[facility] Patient Name: Christopher Wyatt Procedure Date: 06/11/2021 10:11 AM MRN: [id number] Date of Birth: 1976/07/27 Attending MD: Norvel Richards , MD CSN: [id number] Age: 45 Admit Type: Outpatient Procedure:                Upper GI endoscopy Indications:              Reflux esophagitis Providers:                Norvel Richards, MD, Crystal Page, [place]                            Risa Grill, Technician Referring MD:              Medicines:                Propofol per Anesthesia Complications:            No immediate complications. Estimated Blood Loss:     Estimated blood loss was minimal. Procedure:                Pre-Anesthesia Assessment:                           - Prior to the procedure, a History and Physical                            was performed, and patient medications and                            allergies were reviewed. The patient's tolerance of                            previous anesthesia was also reviewed. The risks                            and benefits of the procedure and the sedation                            options and risks were discussed with the patient.                            All questions were answered, and informed consent                            was obtained. Prior Anticoagulants: The patient has                            taken no previous anticoagulant or antiplatelet                            agents. ASA Grade Assessment: II - A patient with                            mild systemic disease. After reviewing the risks  and benefits, the patient was deemed in                            satisfactory condition to undergo the procedure.                           After obtaining informed consent, the endoscope was                            passed under direct vision. Throughout the                            procedure, the patient's blood pressure, pulse, and                            oxygen  saturations were monitored continuously. The                            [id number]-[id number] ([id number]) scope was introduced through the                            mouth, and advanced to the second part of duodenum.                            The upper GI endoscopy was accomplished without                            difficulty. The patient tolerated the procedure                            well. Scope In: 10:35:49 AM Scope Out: 10:50:37 AM Total Procedure Duration: 0 hours 14 minutes 48 seconds  Findings:      (2) "tongues of salmon-colored epithelium coming up 2 cm above the GE       junction. No esophagitis. No tumor. Small hiatal hernia. Gastric cavity       had an unusual J-shaped configuration. It took me an unusually long       period of time to find the pyloric channel. Gastric cavity would not       hold air for good insufflation. Eventually identified the pyloric       channel and it was easily traversed; examination of the first and second       portion of duodenum revealed no abnormalities. Aagain, I looked back at       the gastric cavity at some length - markedly J-shaped and would not       inflate largely due to escape of insufflated gas.      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 Previously  noted severe esophagitis resolved. J-shaped                            stomach. Poor insufflation. Not seen well. Recent                            bout of epigastric pain necessitating an ED visit                            of uncertain etiology. Recent ED visit for                            epigasric pain as described by the patient Moderate Sedation:      Moderate (conscious) sedation was personally administered by an       anesthesia professional. The following parameters were  monitored: oxygen       saturation, heart rate, blood pressure, respiratory rate, EKG, adequacy       of pulmonary ventilation, and response to care. Recommendation:           - Patient has a contact number available for                            emergencies. The signs and symptoms of potential                            delayed complications were discussed with the                            patient. Return to normal activities tomorrow.                            Written discharge instructions were provided to the                            patient.                           - Advance diet as tolerated.                           - Continue present medications. Continue Dexilant                            60 mg daily. Follow-up on pathology. Upper GI                            series.                           - Return to my office in 3 months. Procedure Code(s):        --- Professional ---                           [id number], Esophagogastroduodenoscopy, flexible,  transoral; diagnostic, including collection of                            specimen(s) by brushing or washing, when performed                            (separate procedure) Diagnosis Code(s):        --- Professional ---                           K21.00, Gastro-esophageal reflux disease with                            esophagitis, without bleeding CPT copyright 2019 American Medical Association. All rights reserved. The codes documented in this report are preliminary and upon coder review may  be revised to meet current compliance requirements. Christopher Wyatt. Glennie Bose, MD Norvel Richards, MD 06/11/2021 11:02:49 AM This report has been signed electronically. Number of Addenda: 0

## 2021-06-11 NOTE — H&P (Signed)
@LOGO @   Primary Care Physician:  Lindell Spar, MD Primary Gastroenterologist:  Dr. Gala Romney  Pre-Procedure History & Physical: HPI:  Christopher Wyatt. is a 45 y.o. male here for follow-up history of severe (grade D) esophagitis seen on EGD last year.  He is doing well now on Dexilant.  Denies dysphagia.  Past Medical History:  Diagnosis Date   Anxiety    Chronic   GERD (gastroesophageal reflux disease)    Headache    Neuroma    OCD (obsessive compulsive disorder)     Past Surgical History:  Procedure Laterality Date   CIRCUMCISION  2004   ESOPHAGOGASTRODUODENOSCOPY (EGD) WITH PROPOFOL N/A 08/10/2020   Procedure: ESOPHAGOGASTRODUODENOSCOPY (EGD) WITH PROPOFOL;  Surgeon: Daneil Dolin, MD;  [facility];  Service: Endoscopy;  Laterality: N/A;  1:45pm   EXCISION MORTON'S NEUROMA  10/16/2020   Procedure: EXCISION MORTON'S NEUROMA,AND REMOVAL OF INGROWN NAIL-GREAT TOES LEFT;  Surgeon: Felipa Furnace, DPM;  Location: [facility];  Service: Podiatry;;   WISDOM TOOTH EXTRACTION  2011    Prior to Admission medications   Medication Sig Start Date End Date Taking? Authorizing Provider  dexlansoprazole (DEXILANT) 60 MG capsule TAKE 1 CAPSULE(60 MG) BY MOUTH DAILY 02/01/21  Yes Annitta Needs, NP  HYDROcodone-acetaminophen (NORCO/VICODIN) 5-325 MG tablet Take 1 tablet by mouth every 4 (four) hours as needed for up to 5 doses. 02/12/21  Yes Garald Balding, PA-C  ibuprofen (ADVIL) 800 MG tablet Take 1 tablet (800 mg total) by mouth 3 (three) times daily. Patient not taking: Reported on 06/05/2021 02/12/21   Garald Balding, PA-C  traMADol (ULTRAM) 50 MG tablet Take 1 tablet (50 mg total) by mouth every 6 (six) hours as needed. Patient not taking: Reported on 06/05/2021 02/08/21   Rodriguez-Southworth, Sunday Spillers, PA-C  sertraline (ZOLOFT) 100 MG tablet 50 mg daily for four days, then 100 mg daily for four days, then 150 mg daily 12/27/20 02/08/21  Norman Clay, MD    Allergies  as of 05/10/2021 - Review Complete 05/02/2021  Allergen Reaction Noted   Paroxetine hcl Other (See Comments) 01/09/2011    Family History  Problem Relation Age of Onset   Depression Mother    Anxiety disorder Mother    Hypertension Father    Depression Sister    Throat cancer Maternal Grandmother    Alcohol abuse Maternal Uncle    Bipolar disorder Paternal Uncle    Schizophrenia Paternal Uncle    Colon cancer Neg Hx     Social History   Socioeconomic History   Marital status: Significant Other    Spouse name: Not on file   Number of children: 1   Years of education: 12   Highest education level: High school graduate  Occupational History   Occupation: local truck driver  Tobacco Use   Smoking status: Every Day    Packs/day: 0.50    Years: 4.00    Pack years: 2.00    Types: Cigarettes   Smokeless tobacco: Former    Types: Snuff  Vaping Use   Vaping Use: Never used  Substance and Sexual Activity   Alcohol use: Yes    Comment: 1-2 glasses of wine  few x week   Drug use: No   Sexual activity: Yes  Other Topics Concern   Not on file  Social History Narrative   Lives at home, along with his son and parents.   Left-sided.   1-3 cup coffee, occasional soda.  Social Determinants of Health   Financial Resource Strain: Not on file  Food Insecurity: Not on file  Transportation Needs: Not on file  Physical Activity: Not on file  Stress: Not on file  Social Connections: Not on file  Intimate Partner Violence: Not on file    Review of Systems: See HPI, otherwise negative ROS  Physical Exam: There were no vitals taken for this visit. General:   Alert,  Well-developed, well-nourished, pleasant and cooperative in NAD Skin:  Intact without significant lesions or rashes.  Multiple tattoos. Lungs:  Clear throughout to auscultation.   No wheezes, crackles, or rhonchi. No acute distress. Heart:  Regular rate and rhythm; no murmurs, clicks, rubs,  or gallops. Abdomen:  Non-distended, normal bowel sounds.  Soft and nontender without appreciable mass or hepatosplenomegaly.  Pulses:  Normal pulses noted. Extremities:  Without clubbing or edema.  Impression/Plan: 45 year old gentleman with grade D reflux esophagitis found on EGD last year; here for surveillance examination to rule out Barrett's, etc. The risks, benefits, limitations, alternatives and imponderables have been reviewed with the patient. Potential for esophageal dilation, biopsy, etc. have also been reviewed.  Questions have been answered. All parties agreeable.     Notice: This dictation was prepared with Dragon dictation along with smaller phrase technology. Any transcriptional errors that result from this process are unintentional and may not be corrected upon review.

## 2021-06-11 NOTE — Telephone Encounter (Signed)
Patient called back. He stated to cancel this appt for the [facility]. He did not want to have this done. Reports during his procedure this morning he "accidentally" hit record on his cell phone and he has 48 minutes of recording of the doctor, nurses, anaesthesilogy. Reports he has already contacted his lawyer and news 2 "2 wants to know". He will also be requesting his records to be transferred elsewhere. He states he has the recording for anyone who wants to here it and it was not conversation pertaining to his procedure he was having done either. I have informed Reba our Glass blower/designer.

## 2021-06-11 NOTE — Anesthesia Procedure Notes (Signed)
Date/Time: 06/11/2021 10:41 AM Performed by: Orlie Dakin, CRNA Pre-anesthesia Checklist: Patient identified, Emergency Drugs available, Suction available and Patient being monitored Patient Re-evaluated:Patient Re-evaluated prior to induction Oxygen Delivery Method: Nasal cannula Induction Type: IV induction Placement Confirmation: positive ETCO2

## 2021-06-11 NOTE — Telephone Encounter (Signed)
Received call from endo. Dr. Gala Romney wants patient to have UGI for abnormal appearing stomach   UGI scheduled for 10/6 at 9:30am, arrival 9:15am, npo midnight  Called pt, left details VM with appt details.

## 2021-06-13 ENCOUNTER — Encounter: Payer: Self-pay | Admitting: Internal Medicine

## 2021-06-13 LAB — SURGICAL PATHOLOGY

## 2021-06-14 ENCOUNTER — Other Ambulatory Visit (HOSPITAL_COMMUNITY): Payer: Medicaid Other

## 2021-06-14 DIAGNOSIS — M542 Cervicalgia: Secondary | ICD-10-CM | POA: Diagnosis not present

## 2021-06-18 ENCOUNTER — Encounter (HOSPITAL_COMMUNITY): Payer: Self-pay | Admitting: Internal Medicine

## 2021-07-04 DIAGNOSIS — M5030 Other cervical disc degeneration, unspecified cervical region: Secondary | ICD-10-CM | POA: Diagnosis not present

## 2021-08-20 ENCOUNTER — Telehealth: Payer: Self-pay

## 2021-08-20 ENCOUNTER — Other Ambulatory Visit: Payer: Self-pay

## 2021-08-20 ENCOUNTER — Ambulatory Visit: Payer: Medicaid Other | Admitting: Nurse Practitioner

## 2021-08-20 ENCOUNTER — Encounter: Payer: Self-pay | Admitting: Nurse Practitioner

## 2021-08-20 VITALS — BP 125/87 | HR 98 | Ht 72.0 in | Wt 194.0 lb

## 2021-08-20 DIAGNOSIS — F418 Other specified anxiety disorders: Secondary | ICD-10-CM

## 2021-08-20 DIAGNOSIS — F419 Anxiety disorder, unspecified: Secondary | ICD-10-CM | POA: Insufficient documentation

## 2021-08-20 DIAGNOSIS — M542 Cervicalgia: Secondary | ICD-10-CM

## 2021-08-20 DIAGNOSIS — Z139 Encounter for screening, unspecified: Secondary | ICD-10-CM | POA: Diagnosis not present

## 2021-08-20 NOTE — Assessment & Plan Note (Deleted)
refer to psych. Pt unable to tolerate zoloft and clonazepam   in the past . He is still having anxiety.

## 2021-08-20 NOTE — Assessment & Plan Note (Signed)
Pt wants to see another otho specialist for a second opinion for treatment of his neck pain. Use tylenol as needed for pain.PT was helpful.

## 2021-08-20 NOTE — Progress Notes (Signed)
   Christopher Wyatt.     MRN: [id number]      DOB: [birth year]   HPI Christopher Wyatt is here for Acute visit. The PT denies any adverse reactions to current medications since the last visit.   PT c/o chronic right side neck pain. On June 17th this year he went out of work due pinched nerve and severe arthritis of the neck region. [facility],Hydrocodone did not help his pain. When he wakes up , the pain is unbearable..Dr. Nelva Bush otho recommended dry needling but his insurance will not cover it. He had PT , PT helped him some. He takes nothing for the pain, he has been dealing with the pain. He would like to see another otho for 2nd opinion.  Pt c/o anxiety,has history of panic attacks, he was previously on clonazepam and zoloft, medications made him sleepy and he has sexual dysfunction as well so he stopped taking meds. As the sun goes down his anxiety increases and he has been using tinted windows in his vehicles to cope with his anxiety. He would like a form completed for him to have tinted windows in his car.     ROS Denies recent fever or chills. Denies sinus pressure, nasal congestion, ear pain or sore throat.,has chronic neck pain. Denies chest congestion, productive cough or wheezing. Denies chest pains, palpitations and leg swelling Denies abdominal pain, nausea, vomiting,diarrhea or constipation.   Denies dysuria, frequency, hesitancy or incontinence. Denies joint pain, swelling and limitation in mobility. Denies headaches, seizures, numbness, or tingling. Denies depression, insomnia. Denies skin break down or rash.   PE  BP 125/87 (BP Location: Right Arm, Patient Position: Sitting, Cuff Size: Normal)   Pulse 98   Ht 6' (1.829 m)   Wt 194 lb (88 kg)   SpO2 95%   BMI 26.31 kg/m   Patient alert and oriented and in no cardiopulmonary distress.  HEENT: No facial asymmetry, EOMI,     Neck supple, able to do ROM of the neck, c/o of tenderness on ROM of neck  .  Chest: Clear to auscultation bilaterally.  CVS: S1, S2 no murmurs, no S3.Regular rate.  ABD: Soft non tender.   Ext: No edema  MS: Adequate ROM spine, shoulders, hips and knees.  Skin: Intact, no ulcerations or rash noted.  Psych: Good eye contact, normal affect. Memory intact not anxious or depressed appearing.  CNS: CN 2-12 intact, power,  normal throughout.no focal deficits noted.   Assessment & Plan

## 2021-08-20 NOTE — Patient Instructions (Addendum)
Please get your labs done 3-5 days before your next visit. Referral made to orthopedics for your neck pain  and psychiatrist for your anxiety. Consider getting your COVID , TDAP FLU ,pneumonia vaccine , they are highly recommended.    It is important that you exercise regularly at least 30 minutes 5 times a week.  Think about what you will eat, plan ahead. Choose " clean, green, fresh or frozen" over canned, processed or packaged foods which are more sugary, salty and fatty. 70 to 75% of food eaten should be vegetables and fruit. Three meals at set times with snacks allowed between meals, but they must be fruit or vegetables. Aim to eat over a 12 hour period , example 7 am to 7 pm, and STOP after  your last meal of the day. Drink water,generally about 64 ounces per day, no other drink is as healthy. Fruit juice is best enjoyed in a healthy way, by EATING the fruit.  Thanks for choosing [facility], we consider it a privelige to serve you.

## 2021-08-20 NOTE — Telephone Encounter (Signed)
[employer] tinted window application form   Copied Noted sleeved

## 2021-08-20 NOTE — Assessment & Plan Note (Addendum)
refer to psych. Pt unable to tolerate zoloft and clonazepam   in the past . He is still having anxiety. GAD score is 12 today

## 2021-08-21 ENCOUNTER — Encounter: Payer: Self-pay | Admitting: Orthopaedic Surgery

## 2021-08-22 NOTE — Telephone Encounter (Signed)
Called patient to pick up forms 

## 2021-08-29 DIAGNOSIS — M47812 Spondylosis without myelopathy or radiculopathy, cervical region: Secondary | ICD-10-CM | POA: Diagnosis not present

## 2021-08-29 DIAGNOSIS — M542 Cervicalgia: Secondary | ICD-10-CM | POA: Diagnosis not present

## 2021-09-21 DIAGNOSIS — M47812 Spondylosis without myelopathy or radiculopathy, cervical region: Secondary | ICD-10-CM | POA: Diagnosis not present

## 2021-10-03 ENCOUNTER — Other Ambulatory Visit: Payer: Self-pay | Admitting: Nurse Practitioner

## 2021-10-03 ENCOUNTER — Other Ambulatory Visit: Payer: Self-pay

## 2021-10-03 ENCOUNTER — Ambulatory Visit: Payer: Medicaid Other | Admitting: Gastroenterology

## 2021-10-03 DIAGNOSIS — F418 Other specified anxiety disorders: Secondary | ICD-10-CM

## 2021-10-22 ENCOUNTER — Other Ambulatory Visit: Payer: Self-pay | Admitting: Licensed Clinical Social Worker

## 2021-10-22 NOTE — Patient Instructions (Addendum)
Visit Information  Patient Goals: Anxiety Symptoms Monitored and Managed  Time Frame:  Short Term Goal Priority:  Medium Progress:  On Track  Start Date:  10/22/21 End Date:  01/14/22  Follow Up Date:  12/19/21 at  3:00 PM  Anxiety Symptoms Monitored and Managed  Patient  Coping Strengths:  Attends medical appointments  Patient Self Care Deficits:  Pain issues Anxiety issues Depression issues  Patient Goals:  - spend time or talk with others at least 2 to 3 times per week - practice relaxation or meditation daily - keep a calendar with appointment dates  Follow Up Plan:  LCSW to call client on 12/19/21 at 3:00 PM to assess client needs  Christopher Wyatt MSW, [place] Holiday representative [facility] Care Management [phone]

## 2021-10-22 NOTE — Patient Outreach (Signed)
Medicaid Managed Care Social Work Note  10/22/2021 Name:  Christopher Ahr. MRN:  [id number] DOB:  03-Dec-1975  Christopher Ahr. is an 46 y.o. year old male who is a primary patient of Lindell Spar, MD.  The Medicaid Managed Care Coordination team was consulted for assistance with:  Community Resources   Christopher Wyatt was given information about Medicaid Managed Care Coordination team services today. Christopher A Meda Coffee. ,Patient, agreed to services and verbal consent obtained.  Engaged with patient  for by telephone for initial visit in response to referral for case management and/or care coordination services.   Assessments/Interventions:  Review of past medical history, allergies, medications, health status, including review of consultants reports, laboratory and other test data, was performed as part of comprehensive evaluation and provision of chronic care management services.  SDOH: (Social Determinant of Health) assessments and interventions performed: SDOH Interventions    Flowsheet Row Most Recent Value  SDOH Interventions   Stress Interventions Provide Counseling  [Christopher Wyatt has stress related to managing medical needs]  Depression Interventions/Treatment  --  [informed Christopher Wyatt of LCSW support]       Advanced Directives Status:  See Vynca application for related entries.  Care Plan                 Allergies  Allergen Reactions   Paroxetine Hcl Other (See Comments)    Can not climax    Medications Reviewed Today     Reviewed by Danella Penton, CMA (Certified Medical Assistant) on 08/20/21 at 1435  Med List Status: <None>   Medication Order Taking? Sig Documenting Provider Last Dose Status Informant  dexlansoprazole (DEXILANT) 60 MG capsule [id number] Yes TAKE 1 CAPSULE(60 MG) BY MOUTH DAILY Annitta Needs, NP Taking Active Self  ibuprofen (ADVIL) 800 MG tablet [id number] Yes Take 1 tablet (800 mg total) by mouth 3 (three) times daily. Lyndel Safe  Taking Active Self    Discontinued 02/08/21 1426             Patient Active Problem List   Diagnosis Date Noted   Anxiety 08/20/2021   Vertigo 01/12/2021   Pre-op exam 09/20/2020   Erosive esophagitis 08/11/2020   Morton's neuroma of left foot 08/11/2020   Diarrhea 08/01/2020   Neck pain 06/27/2020   Nicotine dependence 06/13/2020   Cervical pain (neck) 06/13/2020   Alcohol abuse 04/20/2020   GERD (gastroesophageal reflux disease) 10/12/2019   Mixed obsessional thoughts and acts 09/27/2019   Mixed anxiety and depressive disorder 02/05/2019   Nephrolithiasis 01/27/2014   Tobacco use disorder 11/05/2013   OCD (obsessive compulsive disorder)     Conditions to be addressed/monitored per PCP order:  monitor Christopher Wyatt management of anxiety issues and depression issues  Care Plan : [place]  Updates made by Katha Cabal, LCSW since 10/22/2021 12:00 AM     Problem: Emotional Distress      Goal: Emotional Health Supported. Manage Anxiety issues. Manage Depression issues   Start Date: 10/22/2021  Expected End Date: 01/17/2022  This Visit's Progress: On track  Priority: Medium  Note:   Current Barriers:  Anxiety issues Depression issues Challenges in managing health needs Suicidal Ideation/Homicidal Ideation: No  Clinical Social Work Goal(s):  patient will work with SW monthly by telephone or in person to reduce or manage symptoms related to depression and anxiety issues faced Patient will communicate with SW monthly to discuss pain issues of Christopher Wyatt and Christopher Wyatt management of pain issues faced  Patient will attend all scheduled medical appointments in next 30 days  Interventions: Patient interviewed and appropriate assessments performed: PHQ-2/9; GAD-7 1:1 collaboration with Lindell Spar, MD regarding development and update of comprehensive plan of care as evidenced by provider attestation and co-signature LCSW talked with Christopher Wyatt about Christopher Wyatt needs. LCSW informed  Christopher Wyatt that LCSW had received a referral to call Christopher Wyatt to discuss Christopher Wyatt needs. Christopher Wyatt has Medicaid insurance.  (Managed Medicaid Program) Christopher Wyatt spoke of recent care issues.  Christopher Wyatt spoke of pain issues he faced.   Christopher Wyatt spoke of history of arthritis faced. Christopher Wyatt seemed very cautious, apprehensive during call to share information related to his needs.  LCSW assured Christopher Wyatt that LCSW had received referral to call Christopher Wyatt to discuss his current needs. LCSW talked with Christopher Wyatt about role of LCSW to provide support.  Patient  Coping Strengths:  Attends medical appointments  Patient Self Care Deficits:  Pain issues Anxiety issues Depression issues  Patient Goals:  - spend time or talk with others at least 2 to 3 times per week - practice relaxation or meditation daily - keep a calendar with appointment dates  Follow Up Plan:  LCSW to call Christopher Wyatt on 12/19/21 at 3:00 PM to assess Christopher Wyatt needs    Norva Riffle.Sherriann Szuch MSW, [place] Holiday representative [facility] Care Management [phone]

## 2021-11-15 ENCOUNTER — Other Ambulatory Visit: Payer: Self-pay | Admitting: Nurse Practitioner

## 2021-11-15 DIAGNOSIS — R197 Diarrhea, unspecified: Secondary | ICD-10-CM

## 2021-11-15 DIAGNOSIS — K219 Gastro-esophageal reflux disease without esophagitis: Secondary | ICD-10-CM

## 2021-11-19 ENCOUNTER — Telehealth: Payer: Self-pay | Admitting: Internal Medicine

## 2021-11-19 ENCOUNTER — Telehealth: Payer: Self-pay

## 2021-11-19 DIAGNOSIS — K219 Gastro-esophageal reflux disease without esophagitis: Secondary | ICD-10-CM

## 2021-11-19 DIAGNOSIS — R197 Diarrhea, unspecified: Secondary | ICD-10-CM

## 2021-11-19 MED ORDER — DEXLANSOPRAZOLE 60 MG PO CPDR: DELAYED_RELEASE_CAPSULE | ORAL | 1 refills | Status: DC

## 2021-11-19 NOTE — Telephone Encounter (Signed)
I am sending in RX for his Dexilant. He will need to reschedule his missed ov from 09/2021. ? ?Let's have him make an appt for 02/2022, needs to be in-person NOT virtual.  ?

## 2021-11-19 NOTE — Telephone Encounter (Signed)
Noted. Thanks.

## 2021-11-19 NOTE — Telephone Encounter (Signed)
Last office visit was via video on 05/02/21 ?

## 2021-11-19 NOTE — Addendum Note (Signed)
Addended by: Mahala Menghini on: 11/19/2021 02:55 PM ? ? Modules accepted: Orders ? ?

## 2021-11-19 NOTE — Telephone Encounter (Signed)
Pt has not signed a release of records and I haven't forwarded his records to another GI office.  ?

## 2021-11-19 NOTE — Telephone Encounter (Signed)
Christopher Wyatt, can we tell if patient picked up records as he mentioned in 06/11/21 telephone note? ?

## 2021-11-19 NOTE — Telephone Encounter (Signed)
According to Oct 3 telephone note pt was going to transfer his records elsewhere. Lmom notifying patient of this and informed him that he would need to have his Dexilant refilled at the office that he transfers his records to.  ?

## 2021-11-19 NOTE — Telephone Encounter (Signed)
Lmom for pt to return my call.  

## 2021-11-19 NOTE — Telephone Encounter (Signed)
Patient called asking why his dexilant prescription was denied.  He said he still has one refill ?

## 2021-11-19 NOTE — Telephone Encounter (Signed)
Pt called back stating that according to his mychart we are still his provider. Pt stated that what took place in October had to do with the hospital staff during his procedure and then proceeding to ask me if I was in the surgical room. I informed the pt that I was not and that I don't work over at the hospital. Pt stated that according to his bottle he has one more refill good until May. I informed patient to contact his pharmacy regarding how many refills that he has on file with them. Pt verbalized understanding.  ?

## 2021-11-19 NOTE — Telephone Encounter (Signed)
Pt called to say that he needed his Dexilant refilled. I told him to call his pharmacy and have them send [place] a refill request and he said that he did and the pharmacy was waiting on [place]. Please advise. [phone] ?

## 2021-11-19 NOTE — Telephone Encounter (Signed)
Noted  

## 2021-11-20 NOTE — Telephone Encounter (Signed)
Noted  

## 2021-11-20 NOTE — Telephone Encounter (Signed)
Pt was notified via vm of refill and need of an in office appt for further refills.  ?

## 2021-12-03 ENCOUNTER — Ambulatory Visit: Payer: Medicaid Other | Admitting: Internal Medicine

## 2021-12-03 ENCOUNTER — Other Ambulatory Visit: Payer: Self-pay

## 2021-12-03 ENCOUNTER — Encounter: Payer: Self-pay | Admitting: Internal Medicine

## 2021-12-03 DIAGNOSIS — F418 Other specified anxiety disorders: Secondary | ICD-10-CM

## 2021-12-03 MED ORDER — HYDROXYZINE PAMOATE 25 MG PO CAPS: 25.0000 mg | ORAL_CAPSULE | Freq: Three times a day (TID) | ORAL | 1 refills | Status: DC | PRN

## 2021-12-03 MED ORDER — VENLAFAXINE HCL 37.5 MG PO TABS: 37.5000 mg | ORAL_TABLET | Freq: Two times a day (BID) | ORAL | 2 refills | Status: DC

## 2021-12-03 NOTE — Patient Instructions (Addendum)
Please take Venlafaxine/Effexor for anxiety. ? ?Please take Hydroxyzine as needed for panic episode. ?

## 2021-12-03 NOTE — Progress Notes (Signed)
?  ? ?Virtual Visit via Telephone Note  ? ?This visit type was conducted due to national recommendations for restrictions regarding the COVID-19 Pandemic (e.g. social distancing) in an effort to limit this patient's exposure and mitigate transmission in our community.  Due to his co-morbid illnesses, this patient is at least at moderate risk for complications without adequate follow up.  This format is felt to be most appropriate for this patient at this time.  The patient did not have access to video technology/had technical difficulties with video requiring transitioning to audio format only (telephone).  All issues noted in this document were discussed and addressed.  No physical exam could be performed with this format.  ? ?Evaluation Performed:  Follow-up visit ? ?Date:  12/03/2021  ? ?ID:  Christopher Wyatt., DOB [birth year], MRN [id number] ? ?Patient Location: Home ?Provider Location: Office/Clinic ? ?Participants: Patient ?Location of Patient: Home ?Location of Provider: Telehealth ?Consent was obtain for visit to be over via telehealth. ?I verified that I am speaking with the correct person using two identifiers. ? ?PCP:  Lindell Spar, MD  ? ?Chief Complaint: Anxiety/panic episodes ? ?History of Present Illness:   ? ?Christopher Wyatt. is a 46 y.o. male who has a televisit for complaint of uncontrolled anxiety and panic episodes, which are chronic.  He has tried multiple SSRIs and clonazepam in the past, and had variable side effects with them.  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  He currently denies any SI or HI. ? ?The patient does not have symptoms concerning for COVID-19 infection (fever, chills, cough, or new shortness of breath).  ? ?Past Medical, Surgical, Social History, Allergies, and Medications have been Reviewed. ? ?Past Medical History:  ?Diagnosis Date  ? Anxiety   ? Chronic  ? GERD (gastroesophageal reflux disease)   ? Headache   ? Neuroma   ? OCD (obsessive  compulsive disorder)   ? ?Past Surgical History:  ?Procedure Laterality Date  ? BIOPSY  06/11/2021  ? Procedure: BIOPSY;  Surgeon: Daneil Dolin, MD;  [facility];  Service: Endoscopy;;  ? CIRCUMCISION  2004  ? ESOPHAGOGASTRODUODENOSCOPY (EGD) WITH PROPOFOL N/A 08/10/2020  ? Procedure: ESOPHAGOGASTRODUODENOSCOPY (EGD) WITH PROPOFOL;  Surgeon: Daneil Dolin, MD;  [facility];  Service: Endoscopy;  Laterality: N/A;  1:45pm  ? ESOPHAGOGASTRODUODENOSCOPY (EGD) WITH PROPOFOL N/A 06/11/2021  ? Procedure: ESOPHAGOGASTRODUODENOSCOPY (EGD) WITH PROPOFOL;  Surgeon: Daneil Dolin, MD;  [facility];  Service: Endoscopy;  Laterality: N/A;  10:30am  ? EXCISION MORTON'S NEUROMA  10/16/2020  ? Procedure: EXCISION MORTON'S [place] OF INGROWN NAIL-GREAT TOES LEFT;  Surgeon: Felipa Furnace, DPM;  Location: [facility];  Service: Podiatry;;  ? [place] EXTRACTION  2011  ?  ? ?Current Meds  ?Medication Sig  ? dexlansoprazole (DEXILANT) 60 MG capsule TAKE 1 CAPSULE(60 MG) BY MOUTH DAILY  ?  ? ?Allergies:   Cat hair extract and Paroxetine hcl  ? ?ROS:   ?Please see the history of present illness.    ? ?All other systems reviewed and are negative. ? ? ?Labs/Other Tests and Data Reviewed:   ? ?Recent Labs: ?05/30/2021: ALT 26; BUN 9; Creatinine, Ser 0.83; Hemoglobin 17.6; Platelets 214; Potassium 3.2; Sodium 137  ? ?Recent Lipid Panel ?Lab Results  ?Component Value Date/Time  ? CHOL 137 03/26/2016 04:50 PM  ? TRIG 111 03/26/2016 04:50 PM  ? HDL 61 03/26/2016 04:50  PM  ? CHOLHDL 2.2 03/26/2016 04:50 PM  ? [place] 54 03/26/2016 04:50 PM  ? ? ?Wt Readings from Last 3 Encounters:  ?08/20/21 194 lb (88 kg)  ?05/30/21 178 lb (80.7 kg)  ?02/12/21 183 lb (83 kg)  ?  ? ?ASSESSMENT & PLAN:   ? ?Mixed anxiety and depressive disorder ?Has tried multiple SSRIs in the past, including Zoloft, Celexa and Wellbutrin -caused drowsiness and feeling spacey; Paxil caused anorgasmia ?Chart review  suggests that he had good response with Effexor in the past, started Effexor for now ?Started Vistaril as needed for anxiety spells ?Avoid BZD's for now ?Was referred to [facility] for psychiatry in the past, has not been able to find a provider yet ? ? ?Time:   ?Today, I have spent 18 minutes reviewing the chart, including problem list, medications, and with the patient with telehealth technology discussing the above problems. ? ? ?Medication Adjustments/Labs and Tests Ordered: ?Current medicines are reviewed at length with the patient today.  Concerns regarding medicines are outlined above.  ? ?Tests Ordered: ?No orders of the defined types were placed in this encounter. ? ? ?Medication Changes: ?No orders of the defined types were placed in this encounter. ? ? ? ?Note: This dictation was prepared with Dragon dictation along with smaller phrase technology. Similar sounding words can be transcribed inadequately or may not be corrected upon review. Any transcriptional errors that result from this process are unintentional.  ?  ? ? ?Disposition:  Follow up  ?Signed, ?Lindell Spar, MD  ?12/03/2021 12:00 PM    ? ?[facility] ?[facility] ?

## 2021-12-03 NOTE — Assessment & Plan Note (Signed)
Has tried multiple SSRIs in the past, including Zoloft, Celexa and Wellbutrin -caused drowsiness and feeling spacey; Paxil caused anorgasmia ?Chart review suggests that he had good response with Effexor in the past, started Effexor for now ?Started Vistaril as needed for anxiety spells ?Avoid BZD's for now ?Was referred to CCM for psychiatry in the past, has not been able to find a provider yet ?

## 2021-12-19 ENCOUNTER — Other Ambulatory Visit: Payer: Self-pay | Admitting: Licensed Clinical Social Worker

## 2021-12-19 NOTE — Patient Instructions (Signed)
Visit Information ? ?Mr. Tarkowski was given information about Medicaid Managed Care team care coordination services as a part of their Healthy [facility] Medicaid benefit. Aureliano A Meda Coffee. wishes to consider whether to engagement with the [facility] Managed Care team.  ? ?If you are experiencing a medical emergency, please call 911 or report to your local emergency department or urgent care.  ? ?If you have a non-emergency medical problem during routine business hours, please contact your provider's office and ask to speak with a nurse.  ? ?For questions related to your Healthy [facility] health plan, please call: [phone] or visit the homepage here: [email or web address] ? ?If you would like to schedule transportation through your Healthy [facility] plan, please call the following number at least 2 days in advance of your appointment: [phone] ? For information about your ride after you set it up, call Ride Assist at [phone]. Use this number to activate a Will Call pickup, or if your transportation is late for a scheduled pickup. Use this number, too, if you need to make a change or cancel a previously scheduled reservation. ? If you need transportation services right away, call [phone]. The after-hours call center is staffed 24 hours to handle ride assistance and urgent reservation requests (including discharges) 365 days a year. Urgent trips include sick visits, hospital discharge requests and life-sustaining treatment. ? ?Call the [place] at [phone], at any time, 24 hours a day, 7 days a week. If you are in danger or need immediate medical attention call 911. ? ?If you would like help to quit smoking, call 1-800-QUIT-NOW (808)490-4852) OR Espa?ol: 1-855-D?jelo-Ya 352 421 1933) o para m?s informaci?n haga clic aqu? or Text READY to 200-400 to register via text ? ?Following is a copy of your plan of care:  ?Care Plan : [place]  ?Updates made by Greg Cutter, LCSW since 12/19/2021 12:00 AM  ?  ? ?Problem: Emotional Distress   ?  ? ?Goal: Emotional Health Supported. Manage Anxiety issues. Manage Depression issues   ?Start Date: 10/22/2021  ?Expected End Date: 01/17/2022  ?Recent Progress: On track  ?Priority: Medium  ?Note:   ?Current Barriers:  ?Anxiety issues ?Depression issues ?Challenges in managing health needs ?Suicidal Ideation/Homicidal Ideation: No ? ?Clinical Social Work Goal(s):  ?patient will work with SW monthly by telephone or in person to reduce or manage symptoms related to depression and anxiety issues faced ?Patient will communicate with SW monthly to discuss pain issues of client and client management of pain issues faced ?Patient will attend all scheduled medical appointments in next 30 days ?Patient  Coping Strengths:  ?Attends medical appointments ? ?Patient Self Care Deficits:  ?Pain issues ?Anxiety issues ?Depression issues ? ?Patient Goals:  ?- spend time or talk with others at least 2 to 3 times per week ?- practice relaxation or meditation daily ?- keep a calendar with appointment dates ? ?Follow Up Plan:  LCSW to call client on 01/18/22 at 2:30 PM to assess client needs and decision on [facility] involvement and psychiatry referral.  ? ?Eula Fried, BSW, MSW, LCSW ?Managed Medicaid LCSW ?[facility] ?[email or web address] ?Phone: [phone] ? ? ?  ? ?  ?

## 2021-12-19 NOTE — Patient Outreach (Signed)
?Medicaid Managed Care ?Social Work Note ? ?12/19/2021 ?Name:  Christopher Ahr. MRN:  [id number] DOB:  12-Aug-1976 ? ?Christopher Wyatt. is an 46 y.o. year old male who is a primary patient of Lindell Spar, MD.  The Medicaid Managed Care Coordination team was consulted for assistance with:  [place] and Resources ? ?Mr. Albornoz was given information about Medicaid Managed Care Coordination team services today. Morrie [employer]. Patient did not want to agree to services today stating that he is unsure of whether he needs [facility] support and follow up. He wishes to consider this option for 30 days..  Patient provided Medicaid Managed Care team contact information and advised to reach out if in need of care coordination or care management services before his appointment with [facility] LCSW on 01/19/22.  ? ?Engaged with patient  for by telephone forfollow up visit in response to referral for case management and/or care coordination services.  ? ?Assessments/Interventions:  Review of past medical history, allergies, medications, health status, including review of consultants reports, laboratory and other test data, was performed as part of comprehensive evaluation and provision of chronic care management services. ? ?SDOH: (Social Determinant of Health) assessments and interventions performed: ? ? ?Advanced Directives Status:  Not addressed in this encounter. ? ?Care Plan ?                ?Allergies  ?Allergen Reactions  ? Cat Hair Extract Itching  ? Paroxetine Hcl Other (See Comments)  ?  Can not climax  ? ? ?Medications Reviewed Today   ? ? Reviewed by Lindell Spar, MD (Physician) on 12/03/21 at 1222  Med List Status: <None>  ? ?Medication Order Taking? Sig Documenting Provider Last Dose Status Informant  ?dexlansoprazole (DEXILANT) 60 MG capsule [id number] Yes TAKE 1 CAPSULE(60 MG) BY MOUTH DAILY Mahala Menghini, PA-C Taking Active   ?hydrOXYzine (VISTARIL) 25 MG capsule [id number] Yes Take 1  capsule (25 mg total) by mouth every 8 (eight) hours as needed for anxiety. Lindell Spar, MD  Active   ?meloxicam (MOBIC) 15 MG tablet [id number]  Take 15 mg by mouth daily as needed. [provider]  Active   ?  Discontinued 02/08/21 1426   ?venlafaxine (EFFEXOR) 37.5 MG tablet [id number] Yes Take 1 tablet (37.5 mg total) by mouth 2 (two) times daily with a meal. Lindell Spar, MD  Active   ? ?  ?  ? ?  ? ? ?Patient Active Problem List  ? Diagnosis Date Noted  ? Anxiety 08/20/2021  ? DDD (degenerative disc disease), cervical 04/05/2021  ? Shoulder pain 02/26/2021  ? Vertigo 01/12/2021  ? Pre-op exam 09/20/2020  ? Erosive esophagitis 08/11/2020  ? Morton's neuroma of left foot 08/11/2020  ? Diarrhea 08/01/2020  ? Neck pain 06/27/2020  ? Nicotine dependence 06/13/2020  ? Cervical pain (neck) 06/13/2020  ? Alcohol abuse 04/20/2020  ? GERD (gastroesophageal reflux disease) 10/12/2019  ? Mixed obsessional thoughts and acts 09/27/2019  ? Mixed anxiety and depressive disorder 02/05/2019  ? Nephrolithiasis 01/27/2014  ? Tobacco use disorder 11/05/2013  ? OCD (obsessive compulsive disorder)   ? ? ?Conditions to be addressed/monitored per PCP order:  Anxiety and Depression ? ?Care Plan : LCSW Care Plan  ?Updates made by Greg Cutter, LCSW since 12/19/2021 12:00 AM  ?  ? ?Problem: Emotional Distress   ?  ? ?Goal: Emotional Health Supported. Manage Anxiety issues. Manage Depression issues   ?Start Date: 10/22/2021  ?  Expected End Date: 01/17/2022  ?Recent Progress: On track  ?Priority: Medium  ?Note:   ?Current Barriers:  ?Anxiety issues ?Depression issues ?Challenges in managing health needs ?Suicidal Ideation/Homicidal Ideation: No ? ?Clinical Social Work Goal(s):  ?patient will work with SW monthly by telephone or in person to reduce or manage symptoms related to depression and anxiety issues faced ?Patient will communicate with SW monthly to discuss pain issues of client and client management of pain issues  faced ?Patient will attend all scheduled medical appointments in next 30 days ? ?Interventions: ?Patient interviewed and appropriate assessments performed: PHQ-2/9; GAD-7 ?1:1 collaboration with Lindell Spar, MD regarding development and update of comprehensive plan of care as evidenced by provider attestation and co-signature ?LCSW talked with client about client needs. ?LCSW informed client that LCSW had received a referral to call client to discuss client needs. Client has Medicaid insurance.  (Managed Medicaid Program) ?Client spoke of recent care issues.  ?Client spoke of pain issues he faced.   ?Client spoke of history of arthritis faced. ?Client seemed very cautious, apprehensive during call to share information related to his needs.  LCSW assured client that LCSW had received referral to call client to discuss his current needs. LCSW talked with client about role of LCSW to provide support. ?UPDATE 12/19/21- Patient was contacted early on 12/19/21 to inquire about scheduled appointment for today as it was originally scheduled with [facility] BSW instead of LCSW. Patient answered and talked to [facility] LCSW for 8 minutes about him not being sure if he needed [facility] support/follow up or if he wanted a referral to psychiatry. He wishes to think this over for the next 30 days. He denied the need for a counseling referral. [facility] LCSW will contact him on 01/19/22 to get his final decision.  ? ?Patient  Coping Strengths:  ?Attends medical appointments ? ?Patient Self Care Deficits:  ?Pain issues ?Anxiety issues ?Depression issues ? ?Patient Goals:  ?- spend time or talk with others at least 2 to 3 times per week ?- practice relaxation or meditation daily ?- keep a calendar with appointment dates ? ?Follow Up Plan:  LCSW to call client on 01/18/22 at 2:30 PM to assess client needs and decision on [facility] involvement and psychiatry referral.  ?  ? ? ?Follow up:  Patient agrees to Care Plan and Follow-up. ? ?Plan: The Managed Medicaid care  management team will reach out to the patient again over the next 35 days. ? ?Date/time of next scheduled Social Work care management/care coordination outreach:  01/19/22 at 2:30 pm ? ?Eula Fried, BSW, MSW, LCSW ?Managed Medicaid LCSW ?[facility] ?[email or web address] ?Phone: [phone] ? ? ?

## 2021-12-26 DIAGNOSIS — R296 Repeated falls: Secondary | ICD-10-CM | POA: Diagnosis not present

## 2021-12-27 ENCOUNTER — Ambulatory Visit (INDEPENDENT_AMBULATORY_CARE_PROVIDER_SITE_OTHER): Payer: Self-pay | Admitting: Internal Medicine

## 2021-12-27 ENCOUNTER — Telehealth: Payer: Self-pay

## 2021-12-27 ENCOUNTER — Encounter: Payer: Self-pay | Admitting: Internal Medicine

## 2021-12-27 VITALS — BP 138/90 | HR 92 | Resp 18 | Ht 72.0 in | Wt 193.2 lb

## 2021-12-27 DIAGNOSIS — M5442 Lumbago with sciatica, left side: Secondary | ICD-10-CM

## 2021-12-27 DIAGNOSIS — Z09 Encounter for follow-up examination after completed treatment for conditions other than malignant neoplasm: Secondary | ICD-10-CM

## 2021-12-27 DIAGNOSIS — G8929 Other chronic pain: Secondary | ICD-10-CM | POA: Insufficient documentation

## 2021-12-27 DIAGNOSIS — W19XXXD Unspecified fall, subsequent encounter: Secondary | ICD-10-CM

## 2021-12-27 MED ORDER — PREDNISONE 10 MG (21) PO TBPK: ORAL_TABLET | ORAL | 0 refills | Status: DC

## 2021-12-27 MED ORDER — CYCLOBENZAPRINE HCL 5 MG PO TABS: 5.0000 mg | ORAL_TABLET | Freq: Three times a day (TID) | ORAL | 1 refills | Status: DC | PRN

## 2021-12-27 NOTE — Patient Instructions (Signed)
Please start taking Prednisone as prescribed. ? ?Please avoid heavy lifting and frequent bending. ? ?You are being referred to Orthopedic surgery. ?

## 2021-12-27 NOTE — Assessment & Plan Note (Signed)
S/p fall at work, appears to be mechanical fall ?Has chronic low back pain with radicular symptoms to LLE ?Referred to orthopedic surgery ?Offered Depo-Medrol and Toradol in the office today, but he denies ?Continuing Percocet as needed for severe pain ?Started Sterapred and as needed Flexeril ?Advised to take Tylenol or ibuprofen as needed for mild-to-moderate pain ?Work note provided for 2 weeks ? ?

## 2021-12-27 NOTE — Progress Notes (Signed)
? ?Acute Office Visit ? ?Subjective:  ? ? Patient ID: Christopher Ahr., male    DOB: 1976/06/14, 46 y.o.   MRN: [id number] ? ?Chief Complaint  ?Patient presents with  ? Acute Visit  ?  Fall from truck 12-26-21 has left hip leg and back pain did go to [facility] in [place] was given pain medications and did xray  ? ? ?HPI ?Patient is in today for ER visit follow-up after a fall at his work. ? ?Patient presented after falling approximately 4 to 5 feet he was climbing down a tanker/patient drives truck hauls corn syrup/was checking pressure setting and while climbing down/rubbed onto a chain that was loose causing him to spin around and eventually fall landing on his left hip/complaining of hip pain and back discomfort/states he developed some paresthesias to the third fourth and fifth toes/denies any head injury chest pain abdominal pain. ? ?His x-ray of the recent lumbar spine were negative for acute fracture or traumatic malalignment.  He was given Percocet for severe pain.  Currently, he complains of severe low back pain, constant, sharp, radiating to left hip and LE and also has numbness of 3rd-5th toes of left foot.  His pain is worse with bending and prolonged standing.  Denies any saddle anesthesia, urinary or stool incontinence.  He reports that he has not had any bowel movement since yesterday. ? ? ?Past Medical History:  ?Diagnosis Date  ? Anxiety   ? Chronic  ? GERD (gastroesophageal reflux disease)   ? Headache   ? Neuroma   ? OCD (obsessive compulsive disorder)   ? ? ?Past Surgical History:  ?Procedure Laterality Date  ? BIOPSY  06/11/2021  ? Procedure: BIOPSY;  Surgeon: Daneil Dolin, MD;  [facility];  Service: Endoscopy;;  ? CIRCUMCISION  2004  ? ESOPHAGOGASTRODUODENOSCOPY (EGD) WITH PROPOFOL N/A 08/10/2020  ? Procedure: ESOPHAGOGASTRODUODENOSCOPY (EGD) WITH PROPOFOL;  Surgeon: Daneil Dolin, MD;  [facility];  Service: Endoscopy;  Laterality: N/A;  1:45pm  ?  ESOPHAGOGASTRODUODENOSCOPY (EGD) WITH PROPOFOL N/A 06/11/2021  ? Procedure: ESOPHAGOGASTRODUODENOSCOPY (EGD) WITH PROPOFOL;  Surgeon: Daneil Dolin, MD;  [facility];  Service: Endoscopy;  Laterality: N/A;  10:30am  ? EXCISION MORTON'S NEUROMA  10/16/2020  ? Procedure: EXCISION MORTON'S [place] OF INGROWN NAIL-GREAT TOES LEFT;  Surgeon: Felipa Furnace, DPM;  Location: [facility];  Service: Podiatry;;  ? [place] EXTRACTION  2011  ? ? ?Family History  ?Problem Relation Age of Onset  ? Depression Mother   ? Anxiety disorder Mother   ? Hypertension Father   ? Depression Sister   ? Throat cancer Maternal Grandmother   ? Alcohol abuse Maternal Uncle   ? Bipolar disorder Paternal Uncle   ? Schizophrenia Paternal Uncle   ? Colon cancer Neg Hx   ? ? ?Social History  ? ?Socioeconomic History  ? Marital status: Significant Other  ?  Spouse name: Not on file  ? Number of children: 1  ? Years of education: 71  ? Highest education level: High school graduate  ?Occupational History  ? Occupation: local truck driver  ?Tobacco Use  ? Smoking status: Every Day  ?  Packs/day: 0.50  ?  Years: 4.00  ?  Pack years: 2.00  ?  Types: Cigarettes  ? Smokeless tobacco: Former  ?  Types: Snuff  ?Vaping Use  ? Vaping Use: Never used  ?Substance and Sexual Activity  ? Alcohol use: Yes  ?  Comment: 1-2 glasses of wine  few x week  ? Drug use: No  ? Sexual activity: Yes  ?Other Topics Concern  ? Not on file  ?Social History Narrative  ? Lives at home, along with his son and parents.  ? Left-sided.  ? 1-3 cup coffee, occasional soda.  ? ?Social Determinants of Health  ? ?Financial Resource Strain: Not on file  ?Food Insecurity: Not on file  ?Transportation Needs: Not on file  ?Physical Activity: Not on file  ?Stress: Stress Concern Present  ? Feeling of Stress : To some extent  ?Social Connections: Not on file  ?Intimate Partner Violence: Not on file  ? ? ?Outpatient Medications Prior to Visit  ?Medication  Sig Dispense Refill  ? dexlansoprazole (DEXILANT) 60 MG capsule TAKE 1 CAPSULE(60 MG) BY MOUTH DAILY 90 capsule 1  ? meloxicam (MOBIC) 15 MG tablet Take 15 mg by mouth daily as needed.    ? oxyCODONE-acetaminophen (PERCOCET/ROXICET) 5-325 MG tablet Take 1 tablet by mouth 4 (four) times daily as needed.    ? hydrOXYzine (VISTARIL) 25 MG capsule Take 1 capsule (25 mg total) by mouth every 8 (eight) hours as needed for anxiety. (Patient not taking: Reported on 12/27/2021) 30 capsule 1  ? venlafaxine (EFFEXOR) 37.5 MG tablet Take 1 tablet (37.5 mg total) by mouth 2 (two) times daily with a meal. (Patient not taking: Reported on 12/27/2021) 60 tablet 2  ? ?No facility-administered medications prior to visit.  ? ? ?Allergies  ?Allergen Reactions  ? Cat Hair Extract Itching  ? Paroxetine Hcl Other (See Comments)  ?  Can not climax  ? ? ?Review of Systems  ?Constitutional:  Negative for chills and fever.  ?Respiratory:  Negative for cough and shortness of breath.   ?Cardiovascular:  Negative for chest pain and palpitations.  ?Gastrointestinal:  Negative for diarrhea, nausea and vomiting.  ?Genitourinary:  Negative for dysuria and hematuria.  ?Musculoskeletal:  Positive for arthralgias (Left hip), back pain, gait problem and neck pain.  ?Skin:  Negative for rash.  ?Neurological:  Positive for weakness (LLE). Negative for dizziness.  ?Psychiatric/Behavioral:  Negative for agitation and behavioral problems. The patient is nervous/anxious.   ? ?   ?Objective:  ?  ?Physical Exam ?Vitals reviewed.  ?Constitutional:   ?   General: He is not in acute distress. ?   Appearance: He is not diaphoretic.  ?HENT:  ?   Head: Normocephalic and atraumatic.  ?   Nose: Nose normal.  ?   Mouth/Throat:  ?   Mouth: Mucous membranes are moist.  ?Eyes:  ?   General: No scleral icterus. ?   Extraocular Movements: Extraocular movements intact.  ?Cardiovascular:  ?   Rate and Rhythm: Normal rate and regular rhythm.  ?   Pulses: Normal pulses.  ?   Heart  sounds: No murmur heard. ?Pulmonary:  ?   Breath sounds: Normal breath sounds. No wheezing or rales.  ?Abdominal:  ?   Palpations: Abdomen is soft.  ?   Tenderness: There is no abdominal tenderness.  ?Musculoskeletal:     ?   General: Tenderness (Lower lumbar spine) present.  ?   Cervical back: Neck supple. No rigidity. Tenderness: around trapezius ridge, right side. ?   Right lower leg: No edema.  ?   Left lower leg: No edema.  ?Skin: ?   General: Skin is warm.  ?   Findings: Bruising present. No rash.  ?Neurological:  ?   General: No focal deficit present.  ?  Mental Status: He is alert and oriented to person, place, and time.  ?   Cranial Nerves: No cranial nerve deficit.  ?   Sensory: Sensory deficit (Left foot) present.  ?   Motor: Weakness (B/l LE - 4/5) present.  ?Psychiatric:     ?   Mood and Affect: Mood normal.     ?   Behavior: Behavior normal.  ? ? ?BP 138/90 (BP Location: Right Arm, Patient Position: Sitting, Cuff Size: Normal)   Pulse 92   Resp 18   Ht 6' (1.829 m)   Wt 193 lb 3.2 oz (87.6 kg)   SpO2 97%   BMI 26.20 kg/m?  ?Wt Readings from Last 3 Encounters:  ?12/27/21 193 lb 3.2 oz (87.6 kg)  ?08/20/21 194 lb (88 kg)  ?05/30/21 178 lb (80.7 kg)  ? ? ? ?   ?Assessment & Plan:  ? ?Problem List Items Addressed This Visit   ? ?  ? Nervous and Auditory  ? Acute left-sided low back pain with left-sided sciatica  ?  S/p fall at work, appears to be mechanical fall ?Has chronic low back pain with radicular symptoms to LLE -could be herniated disc ?Referred to orthopedic surgery ?Offered Depo-Medrol and Toradol in the office today, but he denies ?Continuing Percocet as needed for severe pain ?Started Sterapred and as needed Flexeril ?Advised to take Tylenol or ibuprofen as needed for mild-to-moderate pain ?Work note provided for 2 weeks ? ? ?  ?  ? Relevant Medications  ? oxyCODONE-acetaminophen (PERCOCET/ROXICET) 5-325 MG tablet  ? cyclobenzaprine (FLEXERIL) 5 MG tablet  ? predniSONE (STERAPRED UNI-PAK  21 TAB) 10 MG (21) TBPK tablet  ? Other Relevant Orders  ? Ambulatory referral to Orthopedic Surgery  ? ?Other Visit Diagnoses   ? ? Fall, subsequent encounter    -  Primary  ? Encounter for examination following tr

## 2021-12-27 NOTE — Telephone Encounter (Signed)
Transition Care Management Unsuccessful Follow-up Telephone Call ? ?Date of discharge and from where:  12/26/2021 from [facility] ? ?Attempts:  1st Attempt ? ?Reason for unsuccessful TCM follow-up call:  Left voice message ? ? ? ?

## 2021-12-31 NOTE — Telephone Encounter (Signed)
Visit with PCP completed  ?

## 2022-01-04 ENCOUNTER — Ambulatory Visit: Payer: Medicaid Other | Admitting: Internal Medicine

## 2022-01-08 ENCOUNTER — Telehealth: Payer: Self-pay | Admitting: Internal Medicine

## 2022-01-08 ENCOUNTER — Telehealth: Payer: Self-pay

## 2022-01-08 ENCOUNTER — Ambulatory Visit: Payer: Medicaid Other | Admitting: Orthopaedic Surgery

## 2022-01-08 NOTE — Telephone Encounter (Signed)
Patient called said he went to Orthopedic appointment this morning and they told him to be seen he had to pay self pay and told patient could not be 100% sure that he would get reimbursed for the visit so the patient left.  Still waiting on worker comp approval to be seen by Orthopedic. ? ?Patient called asking can he be seen by another provider in our office needs a extended work note.  Patient was told last Friday 04.28.2023 by the nurse that Dr Posey Pronto could not do nothing else for him needs to go to orthopedic so his appointment with Dr Posey Pronto was canceled for 04.28.2023. ? ?Patient asked if nurse could him a call what is his next step until worker comp gets appointment with orthopedic approved. Can he get extend work note? ? ? ?

## 2022-01-08 NOTE — Telephone Encounter (Signed)
Does this needs to be with Dr Posey Pronto or any provider? ?

## 2022-01-08 NOTE — Telephone Encounter (Signed)
Christopher Wyatt Case Community education officer for Workers Comp  ?Called in on patient behalf for office notes from visit on 4/20.  ? ?Call back # [phone] ?Fax # [phone] ?

## 2022-01-08 NOTE — Telephone Encounter (Signed)
Appointment scheduled for 05.03 @ 2:00 pm ?

## 2022-01-08 NOTE — Telephone Encounter (Signed)
Will need to be with patel not as a work in  ?

## 2022-01-08 NOTE — Telephone Encounter (Signed)
This is a records request please have  her fax over request and send them however you normally send records ?

## 2022-01-09 ENCOUNTER — Encounter: Payer: Self-pay | Admitting: Internal Medicine

## 2022-01-09 ENCOUNTER — Ambulatory Visit (INDEPENDENT_AMBULATORY_CARE_PROVIDER_SITE_OTHER): Payer: Self-pay | Admitting: Internal Medicine

## 2022-01-09 VITALS — BP 132/88 | HR 89 | Resp 18 | Ht 72.0 in | Wt 193.0 lb

## 2022-01-09 DIAGNOSIS — M5442 Lumbago with sciatica, left side: Secondary | ICD-10-CM

## 2022-01-09 MED ORDER — HYDROCODONE-ACETAMINOPHEN 5-325 MG PO TABS: 1.0000 | ORAL_TABLET | Freq: Four times a day (QID) | ORAL | 0 refills | Status: DC | PRN

## 2022-01-09 NOTE — Assessment & Plan Note (Signed)
S/p fall at work, appears to be mechanical fall ?Has chronic low back pain with radicular symptoms to LLE ?Referred to orthopedic surgery ?Offered Depo-Medrol and Toradol in the office today, but he denies ?Norco as needed for severe pain ?As needed Flexeril ?Advised to take Tylenol or Mobic as needed for mild-to-moderate pain ?Work note provided for additional 2 weeks ?

## 2022-01-09 NOTE — Progress Notes (Signed)
? ?Acute Office Visit ? ?Subjective:  ? ? Patient ID: Christopher Ahr., male    DOB: [birth year], 46 y.o.   MRN: [id number] ? ?Chief Complaint  ?Patient presents with  ? Back Pain  ?  Pt had appt 12-27-21 for fall still having pain sees [facility] next week needs extended work note  ? ? ?HPI ?Patient is in today for persistent low back pain since the last visit.  He had a fall from approximately 4 to 5 feet while at work.  He had severe low back pain, 8-10/10, constant, sharp, radiating to left hip and LE and also had numbness of 3rd-5th toes of the left foot.  His pain is worse with bending and prolonged standing.  He was given Sterapred and Flexeril, which has helped somewhat.  He was also given Percocet from urgent care, which caused severe constipation.  He was referred to Ortho care in [place], but was not able to see them as worker's comp would not cover his visit.  He has appointment with [facility] on 05/15. ? ?Past Medical History:  ?Diagnosis Date  ? Anxiety   ? Chronic  ? GERD (gastroesophageal reflux disease)   ? Headache   ? Neuroma   ? OCD (obsessive compulsive disorder)   ? ? ?Past Surgical History:  ?Procedure Laterality Date  ? BIOPSY  06/11/2021  ? Procedure: BIOPSY;  Surgeon: Daneil Dolin, MD;  [facility];  Service: Endoscopy;;  ? CIRCUMCISION  2004  ? ESOPHAGOGASTRODUODENOSCOPY (EGD) WITH PROPOFOL N/A 08/10/2020  ? Procedure: ESOPHAGOGASTRODUODENOSCOPY (EGD) WITH PROPOFOL;  Surgeon: Daneil Dolin, MD;  [facility];  Service: Endoscopy;  Laterality: N/A;  1:45pm  ? ESOPHAGOGASTRODUODENOSCOPY (EGD) WITH PROPOFOL N/A 06/11/2021  ? Procedure: ESOPHAGOGASTRODUODENOSCOPY (EGD) WITH PROPOFOL;  Surgeon: Daneil Dolin, MD;  [facility];  Service: Endoscopy;  Laterality: N/A;  10:30am  ? EXCISION MORTON'S NEUROMA  10/16/2020  ? Procedure: EXCISION MORTON'S [place] OF INGROWN NAIL-GREAT TOES LEFT;  Surgeon: Felipa Furnace, DPM;  Location: [place];  Service: Podiatry;;  ? [place] EXTRACTION  2011  ? ? ?Family History  ?Problem Relation Age of Onset  ? Depression Mother   ? Anxiety disorder Mother   ? Hypertension Father   ? Depression Sister   ? Throat cancer Maternal Grandmother   ? Alcohol abuse Maternal Uncle   ? Bipolar disorder Paternal Uncle   ? Schizophrenia Paternal Uncle   ? Colon cancer Neg Hx   ? ? ?Social History  ? ?Socioeconomic History  ? Marital status: Significant Other  ?  Spouse name: Not on file  ? Number of children: 1  ? Years of education: 35  ? Highest education level: High school graduate  ?Occupational History  ? Occupation: local truck driver  ?Tobacco Use  ? Smoking status: Every Day  ?  Packs/day: 0.50  ?  Years: 4.00  ?  Pack years: 2.00  ?  Types: Cigarettes  ? Smokeless tobacco: Former  ?  Types: Snuff  ?Vaping Use  ? Vaping Use: Never used  ?Substance and Sexual Activity  ? Alcohol use: Yes  ?  Comment: 1-2 glasses of wine  few x week  ? Drug use: No  ? Sexual activity: Yes  ?Other Topics Concern  ? Not on file  ?Social History Narrative  ? Lives at home, along with his son and parents.  ? Left-sided.  ? 1-3 cup coffee, occasional soda.  ? ?  Social Determinants of Health  ? ?Financial Resource Strain: Not on file  ?Food Insecurity: Not on file  ?Transportation Needs: Not on file  ?Physical Activity: Not on file  ?Stress: Stress Concern Present  ? Feeling of Stress : To some extent  ?Social Connections: Not on file  ?Intimate Partner Violence: Not on file  ? ? ?Outpatient Medications Prior to Visit  ?Medication Sig Dispense Refill  ? cyclobenzaprine (FLEXERIL) 5 MG tablet Take 1 tablet (5 mg total) by mouth 3 (three) times daily as needed for muscle spasms. 30 tablet 1  ? dexlansoprazole (DEXILANT) 60 MG capsule TAKE 1 CAPSULE(60 MG) BY MOUTH DAILY 90 capsule 1  ? hydrOXYzine (VISTARIL) 25 MG capsule Take 1 capsule (25 mg total) by mouth every 8 (eight) hours as needed for anxiety. 30 capsule 1  ?  meloxicam (MOBIC) 15 MG tablet Take 15 mg by mouth daily as needed.    ? venlafaxine (EFFEXOR) 37.5 MG tablet Take 1 tablet (37.5 mg total) by mouth 2 (two) times daily with a meal. 60 tablet 2  ? predniSONE (STERAPRED UNI-PAK 21 TAB) 10 MG (21) TBPK tablet Take as package instructions. 1 each 0  ? oxyCODONE-acetaminophen (PERCOCET/ROXICET) 5-325 MG tablet Take 1 tablet by mouth 4 (four) times daily as needed. (Patient not taking: Reported on 01/09/2022)    ? ?No facility-administered medications prior to visit.  ? ? ?Allergies  ?Allergen Reactions  ? Cat Hair Extract Itching  ? Paroxetine Hcl Other (See Comments)  ?  Can not climax  ? ? ?Review of Systems  ?Constitutional:  Negative for chills and fever.  ?Respiratory:  Negative for cough and shortness of breath.   ?Cardiovascular:  Negative for chest pain and palpitations.  ?Gastrointestinal:  Negative for diarrhea, nausea and vomiting.  ?Genitourinary:  Negative for dysuria and hematuria.  ?Musculoskeletal:  Positive for arthralgias (Left hip), back pain, gait problem and neck pain.  ?Skin:  Negative for rash.  ?Neurological:  Positive for weakness (LLE). Negative for dizziness.  ?Psychiatric/Behavioral:  Negative for agitation and behavioral problems. The patient is nervous/anxious.   ? ?   ?Objective:  ?  ?Physical Exam ?Vitals reviewed.  ?Constitutional:   ?   General: He is not in acute distress. ?   Appearance: He is not diaphoretic.  ?HENT:  ?   Head: Normocephalic and atraumatic.  ?   Nose: Nose normal.  ?   Mouth/Throat:  ?   Mouth: Mucous membranes are moist.  ?Eyes:  ?   General: No scleral icterus. ?   Extraocular Movements: Extraocular movements intact.  ?Cardiovascular:  ?   Rate and Rhythm: Normal rate and regular rhythm.  ?   Pulses: Normal pulses.  ?   Heart sounds: No murmur heard. ?Pulmonary:  ?   Breath sounds: Normal breath sounds. No wheezing or rales.  ?Abdominal:  ?   Palpations: Abdomen is soft.  ?   Tenderness: There is no abdominal  tenderness.  ?Musculoskeletal:     ?   General: Tenderness (Lower lumbar spine) present.  ?   Cervical back: Neck supple. No rigidity. Tenderness: around trapezius ridge, right side. ?   Right lower leg: No edema.  ?   Left lower leg: No edema.  ?Skin: ?   General: Skin is warm.  ?   Findings: Bruising present. No rash.  ?Neurological:  ?   General: No focal deficit present.  ?   Mental Status: He is alert and oriented to person, place, and time.  ?  Cranial Nerves: No cranial nerve deficit.  ?   Sensory: Sensory deficit (Left foot) present.  ?   Motor: Weakness (B/l LE - 4/5) present.  ?Psychiatric:     ?   Mood and Affect: Mood normal.     ?   Behavior: Behavior normal.  ? ? ?BP 132/88 (BP Location: Left Arm, Patient Position: Sitting, Cuff Size: Normal)   Pulse 89   Resp 18   Ht 6' (1.829 m)   Wt 193 lb (87.5 kg)   SpO2 97%   BMI 26.18 kg/m?  ?Wt Readings from Last 3 Encounters:  ?01/09/22 193 lb (87.5 kg)  ?12/27/21 193 lb 3.2 oz (87.6 kg)  ?08/20/21 194 lb (88 kg)  ? ? ? ?   ?Assessment & Plan:  ? ?Problem List Items Addressed This Visit   ? ?  ? Nervous and Auditory  ? Acute left-sided low back pain with left-sided sciatica - Primary  ?  S/p fall at work, appears to be mechanical fall ?Has chronic low back pain with radicular symptoms to LLE ?Referred to orthopedic surgery ?Offered Depo-Medrol and Toradol in the office today, but he denies ?Norco as needed for severe pain ?As needed Flexeril ?Advised to take Tylenol or Mobic as needed for mild-to-moderate pain ?Work note provided for additional 2 weeks ? ?  ?  ? Relevant Medications  ? HYDROcodone-acetaminophen (NORCO) 5-325 MG tablet  ? ? ? ?Meds ordered this encounter  ?Medications  ? HYDROcodone-acetaminophen (NORCO) 5-325 MG tablet  ?  Sig: Take 1 tablet by mouth every 6 (six) hours as needed for moderate pain.  ?  Dispense:  20 tablet  ?  Refill:  0  ? ? ? ?Lindell Spar, MD ?

## 2022-01-09 NOTE — Patient Instructions (Signed)
Please take Norco as needed for severe pain. ? ?Please take Flexeril as needed for muscle spasms/stiffness. ? ?Please avoid heavy lifting and frequent bending. ?

## 2022-01-18 ENCOUNTER — Other Ambulatory Visit: Payer: Self-pay | Admitting: Licensed Clinical Social Worker

## 2022-01-18 DIAGNOSIS — F419 Anxiety disorder, unspecified: Secondary | ICD-10-CM

## 2022-01-18 NOTE — Patient Outreach (Addendum)
?Medicaid Managed Care ?Social Work Note ? ?01/18/2022 ?Name:  Christopher Wyatt. MRN:  [id number] DOB:  06-19-1976 ? ?Christopher Wyatt. is an 46 y.o. year old male who is a primary patient of Lindell Spar, MD.  The Medicaid Managed Care Coordination team was consulted for assistance with:  [place] and Resources ? ?Christopher Wyatt was given information about Medicaid Managed Care Coordination team services today. Christopher Wyatt. Patient agreed to services and verbal consent obtained. ? ?Engaged with patient  for by telephone forfollow up visit in response to referral for case management and/or care coordination services.  ? ?Assessments/Interventions:  Review of past medical history, allergies, medications, health status, including review of consultants reports, laboratory and other test data, was performed as part of comprehensive evaluation and provision of chronic care management services. ? ?SDOH: (Social Determinant of Health) assessments and interventions performed: ?SDOH Interventions   ? ?Flowsheet Row Most Recent Value  ?SDOH Interventions   ?Stress Interventions Offered [employer], Provide Counseling  ? ?  ? ? ?Advanced Directives Status:  Not addressed in this encounter. ? ?Care Plan ?                ?Allergies  ?Allergen Reactions  ? Cat Hair Extract Itching  ? Paroxetine Hcl Other (See Comments)  ?  Can not climax  ? ? ?Medications Reviewed Today   ? ? Reviewed by Lindell Spar, MD (Physician) on 01/09/22 at [place] List Status: <None>  ? ?Medication Order Taking? Sig Documenting Provider Last Dose Status Informant  ?cyclobenzaprine (FLEXERIL) 5 MG tablet [id number] Yes Take 1 tablet (5 mg total) by mouth 3 (three) times daily as needed for muscle spasms. Lindell Spar, MD Taking Active   ?dexlansoprazole (DEXILANT) 60 MG capsule [id number] Yes TAKE 1 CAPSULE(60 MG) BY MOUTH DAILY Mahala Menghini, PA-C Taking Active   ?HYDROcodone-acetaminophen (NORCO)  5-325 MG tablet [id number] Yes Take 1 tablet by mouth every 6 (six) hours as needed for moderate pain. Lindell Spar, MD  Active   ?hydrOXYzine (VISTARIL) 25 MG capsule [id number] Yes Take 1 capsule (25 mg total) by mouth every 8 (eight) hours as needed for anxiety. Lindell Spar, MD Taking Active   ?meloxicam [facility]) 15 MG tablet [id number] Yes Take 15 mg by mouth daily as needed. [provider] Taking Active   ?  Discontinued 02/08/21 1426   ?venlafaxine (EFFEXOR) 37.5 MG tablet [id number] Yes Take 1 tablet (37.5 mg total) by mouth 2 (two) times daily with a meal. Lindell Spar, MD Taking Active   ? ?  ?  ? ?  ? ? ?Patient Active Problem List  ? Diagnosis Date Noted  ? Acute left-sided low back pain with left-sided sciatica 12/27/2021  ? Anxiety 08/20/2021  ? DDD (degenerative disc disease), cervical 04/05/2021  ? Vertigo 01/12/2021  ? Erosive esophagitis 08/11/2020  ? Morton's neuroma of left foot 08/11/2020  ? Nicotine dependence 06/13/2020  ? Cervical pain (neck) 06/13/2020  ? Alcohol abuse 04/20/2020  ? GERD (gastroesophageal reflux disease) 10/12/2019  ? Mixed obsessional thoughts and acts 09/27/2019  ? Mixed anxiety and depressive disorder 02/05/2019  ? Nephrolithiasis 01/27/2014  ? Tobacco use disorder 11/05/2013  ? OCD (obsessive compulsive disorder)   ? ? ?Conditions to be addressed/monitored per PCP order:  Anxiety ? ?Care Plan : LCSW Care Plan  ?Updates made by Greg Cutter, LCSW since 01/18/2022 12:00 AM  ?  ? ?Problem: Emotional  Distress   ?  ? ?Goal: Emotional Health Supported. Manage Anxiety issues. Manage Depression issues   ?Start Date: 10/22/2021  ?Expected End Date: 01/17/2022  ?Recent Progress: On track  ?Priority: Medium  ?Note:   ?Timeframe:  Long-Range Goal ?Priority:  High ?Start Date:   01/18/22                  ?Expected End Date:  ongoing                   ?  ?Follow Up Date--02/01/22 ?  ?Current Barriers:  ?Anxiety issues ?OCD ?ADD ?Depression issues ?Challenges in managing  health needs ?Suicidal Ideation/Homicidal Ideation: No ? ?Clinical Social Work Goal(s):  ?patient will work with SW by telephone or in person to develop a stable and sae mental health support network. ?Patient will implement self-care into his daily routine to reduce or manage symptoms related to depression and anxiety issues faced ?Patient will attend all scheduled medical appointments in next 30 days ? ?Interventions: ?Patient interviewed and appropriate assessments performed: PHQ-2/9; GAD-7 ?LCSW talked with client about client needs. ?LCSW informed client that LCSW had received a referral to call client to discuss client needs. Client has Medicaid insurance.  (Managed Medicaid Program) Patient wishes to gain psychiatry in [facility] but does not wish to go to [place]. [facility] LCSW will place referral to [place] [facility] for psychiatry to see if they have any openings for medication therapy.  ?Client spoke of recent care issues.  ?Client spoke of pain issues he faced.   ?Client spoke of history of arthritis faced. ?Client seemed very cautious, apprehensive during call to share information related to his needs.  LCSW assured client that LCSW had received referral to call client to discuss his current needs. LCSW talked with client about role of LCSW to provide support. ?UPDATE 12/19/21- Patient was contacted early on 12/19/21 to inquire about scheduled appointment for today as it was originally scheduled with [facility] BSW instead of LCSW. Patient answered and talked to [facility] LCSW for 8 minutes about him not being sure if he needed [facility] support/follow up or if he wanted a referral to psychiatry. He wishes to think this over for the next 30 days. He denied the need for a counseling referral. [facility] LCSW will contact him on 01/19/22 to get his final decision. UPDATE 01/18/22- Patient reports that he only wants psychiatry but does not wish to gain psychiatry services at [facility]. Referral  placed for [place] [facility] for psychiatry.  ?LCSW provided education on relaxation techniques such as meditation, deep breathing, massage, grounding exercises or yoga that can activate the body's relaxation response and ease symptoms of stress and anxiety. LCSW ask that when pt is struggling with difficult emotions and racing thoughts that they start this relaxation response process. LCSW provided extensive education on healthy coping skills for anxiety. SW used active and reflective listening, validated patient's feelings/concerns, and provided emotional support. ?Patient will work on implementing appropriate self-care habits into their daily routine such as: staying positive, writing a gratitude list, drinking water, staying active around the house, taking their medications as prescribed, combating negative thoughts or emotions and staying connected with their family and friends. Positive reinforcement provided for this decision to improve his self-care.  ? ?Patient  Coping Strengths:  ?Attends medical appointments ? ?Patient Self Care Deficits:  ?Pain issues ?Anxiety issues ?Depression issues ? ?Patient Goals:  ?- spend time or  talk with others at least 2 to 3 times per week ?- practice relaxation or meditation daily ?- keep a calendar with appointment dates ? ?Follow Up Plan:  LCSW to call client on 02/01/22 at 1:00 pm to assess client needs and follow up on psychiatry referral.  ?  ? ? ?Follow up:  Patient agrees to Care Plan and Follow-up. ? ?Plan: The Managed Medicaid care management team will reach out to the patient again over the next 30 days. ? ?Date/time of next scheduled Social Work care management/care coordination outreach:  02/01/22 at 2:30 pm. ? ?Eula Fried, BSW, MSW, LCSW ?Managed Medicaid LCSW ?[place] [facility] ?[email or web address] ?Phone: [phone] ? ? ?

## 2022-01-18 NOTE — Patient Instructions (Addendum)
Visit Information ? ?Mr. Takaki was given information about Medicaid Managed Care team care coordination services as a part of their Healthy [facility] Medicaid benefit. [name] verbally consented to engagement with the [facility] Managed Care team.  ? ?If you are experiencing a medical emergency, please call 911 or report to your local emergency department or urgent care.  ? ?If you have a non-emergency medical problem during routine business hours, please contact your provider's office and ask to speak with a nurse.  ? ?For questions related to your Healthy [facility] health plan, please call: [phone] or visit the homepage here: [email or web address] ? ?If you would like to schedule transportation through your Healthy [facility] plan, please call the following number at least 2 days in advance of your appointment: [phone] ? For information about your ride after you set it up, call Ride Assist at [phone]. Use this number to activate a Will Call pickup, or if your transportation is late for a scheduled pickup. Use this number, too, if you need to make a change or cancel a previously scheduled reservation. ? If you need transportation services right away, call [phone]. The after-hours call center is staffed 24 hours to handle ride assistance and urgent reservation requests (including discharges) 365 days a year. Urgent trips include sick visits, hospital discharge requests and life-sustaining treatment. ? ?Call the [place] at [phone], at any time, 24 hours a day, 7 days a week. If you are in danger or need immediate medical attention call 911. ? ?If you would like help to quit smoking, call 1-800-QUIT-NOW (443) 654-7314) OR Espa?ol: 1-855-D?jelo-Ya (905)540-2324) o para m?s informaci?n haga clic aqu? or Text READY to 200-400 to register via text ? ?Following is a copy of your plan of care:  ?Care Plan : [place]   ?Updates made by Greg Cutter, LCSW since 01/18/2022 12:00 AM  ?  ? ?Problem: Emotional Distress   ?  ? ?Goal: Emotional Health Supported. Manage Anxiety issues. Manage Depression issues   ?Start Date: 10/22/2021  ?Expected End Date: 01/17/2022  ?Recent Progress: On track  ?Priority: Medium  ?Note:   ?Timeframe:  Long-Range Goal ?Priority:  High ?Start Date:   01/18/22                  ?Expected End Date:  ongoing                   ?  ?Follow Up Date--02/01/22 ?  ?Current Barriers:  ?Anxiety issues ?OCD ?ADD ?Depression issues ?Challenges in managing health needs ?Suicidal Ideation/Homicidal Ideation: No ? ?Clinical Social Work Goal(s):  ?patient will work with SW by telephone or in person to develop a stable and sae mental health support network. ?Patient will implement self-care into his daily routine to reduce or manage symptoms related to depression and anxiety issues faced ?Patient will attend all scheduled medical appointments in next 30 days ? ?Patient  Coping Strengths:  ?Attends medical appointments ? ?Patient Self Care Deficits:  ?Pain issues ?Anxiety issues ?Depression issues ? ?Patient Goals:  ?- spend time or talk with others at least 2 to 3 times per week ?- practice relaxation or meditation daily ?- keep a calendar with appointment dates ? ?Follow Up Plan:  LCSW to call client on 02/01/22 at 2:30 pm to assess client needs and follow up on psychiatry referral.  ? ?Eula Fried, BSW, MSW, LCSW ?Managed Medicaid LCSW ?[facility] [facility] ?[email or web address] ?Phone: [phone] ? ? ?  ? ? ?

## 2022-01-30 ENCOUNTER — Ambulatory Visit: Payer: Medicaid Other | Admitting: Internal Medicine

## 2022-02-01 ENCOUNTER — Other Ambulatory Visit: Payer: Self-pay | Admitting: Licensed Clinical Social Worker

## 2022-02-01 DIAGNOSIS — F419 Anxiety disorder, unspecified: Secondary | ICD-10-CM

## 2022-02-01 NOTE — Patient Instructions (Signed)
Visit Information  Mr. Wilkie was given information about Medicaid Managed Care team care coordination services as a part of their Healthy [facility] Medicaid benefit. Truxton A Meda Coffee. verbally consented to engagement with the [facility] Managed Care team.   If you are experiencing a medical emergency, please call 911 or report to your local emergency department or urgent care.   If you have a non-emergency medical problem during routine business hours, please contact your provider's office and ask to speak with a nurse.   For questions related to your Healthy [facility] health plan, please call: [phone] or visit the homepage here: [email or web address]  If you would like to schedule transportation through your Healthy [facility] plan, please call the following number at least 2 days in advance of your appointment: [phone]  For information about your ride after you set it up, call Ride Assist at [phone]. Use this number to activate a Will Call pickup, or if your transportation is late for a scheduled pickup. Use this number, too, if you need to make a change or cancel a previously scheduled reservation.  If you need transportation services right away, call [phone]. The after-hours call center is staffed 24 hours to handle ride assistance and urgent reservation requests (including discharges) 365 days a year. Urgent trips include sick visits, hospital discharge requests and life-sustaining treatment.  Call the [place] at [phone], at any time, 24 hours a day, 7 days a week. If you are in danger or need immediate medical attention call 911.  If you would like help to quit smoking, call 1-800-QUIT-NOW (918)313-2468) OR Espaol: 1-855-Djelo-Ya (6-226-333-5456) o para ms informacin haga clic aqu or Text READY to 200-400 to register via text  Following is a copy of your plan of care:  Care Plan : [place]   Updates made by Greg Cutter, LCSW since 02/01/2022 12:00 AM     Problem: Emotional Distress      Goal: Emotional Health Supported. Manage Anxiety issues. Manage Depression issues   Start Date: 10/22/2021  Expected End Date: 01/17/2022  Recent Progress: On track  Priority: Medium  Note:   Timeframe:  Long-Range Goal Priority:  High Start Date:   01/18/22                  Expected End Date:  ongoing                     Follow Up Date--02/19/22 at 2:30 pm   Current Barriers:  Anxiety issues OCD ADD Depression issues Challenges in managing health needs Suicidal Ideation/Homicidal Ideation: No  Clinical Social Work Goal(s):  patient will work with SW by telephone or in person to develop a stable and safe mental health support network. Patient will implement self-care into his daily routine to reduce or manage symptoms related to depression and anxiety issues faced Patient will attend all scheduled medical appointments in next 30 days  Patient  Coping Strengths:  Attends medical appointments  Patient Self Care Deficits:  Pain issues Anxiety issues Depression issues  Patient Goals:  - spend time or talk with others at least 2 to 3 times per week - practice relaxation or meditation daily - keep a calendar with appointment dates  Follow Up Plan:  LCSW to call client on 02/19/22 at 2:30 pm to assess client needs and follow up on psychiatry referral.   Eula Fried, BSW, MSW, LCSW Managed Medicaid LCSW [email or web address]  Phone: [phone]

## 2022-02-01 NOTE — Patient Outreach (Signed)
Medicaid Managed Care Social Work Note  02/01/2022 Name:  Christopher Wyatt. MRN:  [id number] DOB:  Jul 02, 1976  Christopher Wyatt. is an 46 y.o. year old male who is a primary patient of Lindell Spar, MD.  The Medicaid Managed Care Coordination team was consulted for assistance with:  [place] and Resources  [name] was given information about Medicaid Managed Care Coordination team services today. [name]. Patient agreed to services and verbal consent obtained.  Engaged with patient  for by telephone forfollow up visit in response to referral for case management and/or care coordination services.   Assessments/Interventions:  Review of past medical history, allergies, medications, health status, including review of consultants reports, laboratory and other test data, was performed as part of comprehensive evaluation and provision of chronic care management services.  SDOH: (Social Determinant of Health) assessments and interventions performed: SDOH Interventions    Flowsheet Row Most Recent Value  SDOH Interventions   Stress Interventions Offered [employer], Provide Counseling       Advanced Directives Status:  Not addressed in this encounter.  Care Plan                 Allergies  Allergen Reactions   Cat Hair Extract Itching   Paroxetine Hcl Other (See Comments)    Can not climax    Medications Reviewed Today     Reviewed by Lindell Spar, MD (Physician) on 01/09/22 at [place] List Status: <None>   Medication Order Taking? Sig Documenting Provider Last Dose Status Informant  cyclobenzaprine (FLEXERIL) 5 MG tablet [id number] Yes Take 1 tablet (5 mg total) by mouth 3 (three) times daily as needed for muscle spasms. Lindell Spar, MD Taking Active   dexlansoprazole [facility]) 60 MG capsule [id number] Yes TAKE 1 CAPSULE(60 MG) BY MOUTH DAILY Mahala Menghini, PA-C Taking Active   HYDROcodone-acetaminophen (NORCO)  5-325 MG tablet [id number] Yes Take 1 tablet by mouth every 6 (six) hours as needed for moderate pain. Lindell Spar, MD  Active   hydrOXYzine (VISTARIL) 25 MG capsule [id number] Yes Take 1 capsule (25 mg total) by mouth every 8 (eight) hours as needed for anxiety. Lindell Spar, MD Taking Active   meloxicam [facility]) 15 MG tablet [id number] Yes Take 15 mg by mouth daily as needed. [provider] Taking Active     Discontinued 02/08/21 1426   venlafaxine (EFFEXOR) 37.5 MG tablet [id number] Yes Take 1 tablet (37.5 mg total) by mouth 2 (two) times daily with a meal. Lindell Spar, MD Taking Active             Patient Active Problem List   Diagnosis Date Noted   Acute left-sided low back pain with left-sided sciatica 12/27/2021   Anxiety 08/20/2021   DDD (degenerative disc disease), cervical 04/05/2021   Vertigo 01/12/2021   Erosive esophagitis 08/11/2020   Morton's neuroma of left foot 08/11/2020   Nicotine dependence 06/13/2020   Cervical pain (neck) 06/13/2020   Alcohol abuse 04/20/2020   GERD (gastroesophageal reflux disease) 10/12/2019   Mixed obsessional thoughts and acts 09/27/2019   Mixed anxiety and depressive disorder 02/05/2019   Nephrolithiasis 01/27/2014   Tobacco use disorder 11/05/2013   OCD (obsessive compulsive disorder)     Conditions to be addressed/monitored per PCP order:  Anxiety  Care Plan : [place]  Updates made by Greg Cutter, LCSW since 02/01/2022 12:00 AM     Problem: Emotional  Distress      Goal: Emotional Health Supported. Manage Anxiety issues. Manage Depression issues   Start Date: 10/22/2021  Expected End Date: 01/17/2022  Recent Progress: On track  Priority: Medium  Note:   Timeframe:  Long-Range Goal Priority:  High Start Date:   01/18/22                  Expected End Date:  ongoing                     Follow Up Date--02/19/22 at 2:30 pm   Current Barriers:  Anxiety issues OCD ADD Depression issues Challenges in  managing health needs Suicidal Ideation/Homicidal Ideation: No  Clinical Social Work Goal(s):  patient will work with SW by telephone or in person to develop a stable and sae mental health support network. Patient will implement self-care into his daily routine to reduce or manage symptoms related to depression and anxiety issues faced Patient will attend all scheduled medical appointments in next 30 days  Interventions: Patient interviewed and appropriate assessments performed: PHQ-2/9; GAD-7 LCSW talked with client about client needs. LCSW informed client that LCSW had received a referral to call client to discuss client needs. Client has Medicaid insurance.  (Managed Medicaid Program) Patient wishes to gain psychiatry in [facility] but does not wish to go to [place]. [facility] LCSW will place referral to [facility] for psychiatry to see if they have any openings for medication therapy.  Client spoke of recent care issues.  Client spoke of pain issues he faced.   Client spoke of history of arthritis faced. Client seemed very cautious, apprehensive during call to share information related to his needs.  LCSW assured client that LCSW had received referral to call client to discuss his current needs. LCSW talked with client about role of LCSW to provide support. UPDATE 12/19/21- Patient was contacted early on 12/19/21 to inquire about scheduled appointment for today as it was originally scheduled with [facility] BSW instead of LCSW. Patient answered and talked to [facility] LCSW for 8 minutes about him not being sure if he needed [facility] support/follow up or if he wanted a referral to psychiatry. He wishes to think this over for the next 30 days. He denied the need for a counseling referral. [facility] LCSW will contact him on 01/19/22 to get his final decision. UPDATE 01/18/22- Patient reports that he only wants psychiatry but does not wish to gain psychiatry services at [facility].  Referral placed for [facility] for psychiatry. UPDATE 02/01/22- Patient's referral to [facility] [place] was denied as they do not have a psychiatry for adults at this time. [facility] LCSW will place a referral for [facility] since patient has worked with the psychiatrist there in the past. Patient reports that his anxiety is worsening. He states that he is unable to drive to [facility] but can do telephonic visits. He reports that his PCP was wanting him to get connected with a long term mental health provider to assist with medication management. [facility] LCSW will follow up within 30 days. Patient was encouraged to keep phone nearby in case [facility] calls to enroll him into their program. Patient was advised to contact this [facility] LCSW if needed.  LCSW provided education on relaxation techniques such as meditation, deep breathing, massage, grounding exercises or yoga that can activate the body's relaxation response and ease symptoms of stress and anxiety. LCSW  ask that when pt is struggling with difficult emotions and racing thoughts that they start this relaxation response process. LCSW provided extensive education on healthy coping skills for anxiety. SW used active and reflective listening, validated patient's feelings/concerns, and provided emotional support. Patient will work on implementing appropriate self-care habits into their daily routine such as: staying positive, writing a gratitude list, drinking water, staying active around the house, taking their medications as prescribed, combating negative thoughts or emotions and staying connected with their family and friends. Positive reinforcement provided for this decision to improve his self-care.   Patient  Coping Strengths:  Attends medical appointments  Patient Self Care Deficits:  Pain issues Anxiety issues Depression issues  Patient Goals:  - spend time or talk with others at least 2 to 3 times per  week - practice relaxation or meditation daily - keep a calendar with appointment dates  Follow Up Plan:  LCSW to call client on 02/19/22 at 2:30 pm to assess client needs and follow up on psychiatry referral.     When your car dies or a deadline looms, how do you respond? Long-term, low-grade or acute stress takes a serious toll on your body and mind, so don't ignore feelings of constant tension. Stress is a natural part of life. However, too much stress can harm our health, especially if it continues every day. This is chronic stress and can put you at risk for heart problems like heart disease and depression. Understand what's happening inside your body and learn simple coping skills to combat the negative impacts of everyday stressors.  Types of Stress There are two types of stress: Emotional - types of emotional stress are relationship problems, pressure at work, financial worries, experiencing discrimination or having a major life change. Physical - Examples of physical stress include being sick having pain, not sleeping well, recovery from an injury or having an alcohol and drug use disorder. Fight or Flight Sudden or ongoing stress activates your nervous system and floods your bloodstream with adrenaline and cortisol, two hormones that raise blood pressure, increase heart rate and spike blood sugar. These changes pitch your body into a fight or flight response. That enabled our ancestors to outrun saber-toothed tigers, and it's helpful today for situations like dodging a car accident. But most modern chronic stressors, such as finances or a challenging relationship, keep your body in that heightened state, which hurts your health. Effects of Too Much Stress If constantly under stress, most of [place] will eventually start to function less well.  Multiple studies link chronic stress to a higher risk of heart disease, stroke, depression, weight gain, memory loss and even premature death, so it's  important to recognize the warning signals. Talk to your doctor about ways to manage stress if you're experiencing any of these symptoms: Prolonged periods of poor sleep. Regular, severe headaches. Unexplained weight loss or gain. Feelings of isolation, withdrawal or worthlessness. Constant anger and irritability. Loss of interest in activities. Constant worrying or obsessive thinking. Excessive alcohol or drug use. Inability to concentrate.  10 Ways to Cope with Chronic Stress It's key to recognize stressful situations as they occur because it allows you to focus on managing how you react. We all need to know when to close our eyes and take a deep breath when we feel tension rising. Use these tips to prevent or reduce chronic stress. 1. Rebalance Work and Home All work and no play? If you're spending too much time at the office, intentionally put  more dates in your calendar to enjoy time for fun, either alone or with others. 2. Get Regular Exercise Moving your body on a regular basis balances the nervous system and increases blood circulation, helping to flush out stress hormones. Even a daily 20-minute walk makes a difference. Any kind of exercise can lower stress and improve your mood ? just pick activities that you enjoy and make it a regular habit. 3. Eat Well and Limit Alcohol and Stimulants Alcohol, nicotine and caffeine may temporarily relieve stress but have negative health impacts and can make stress worse in the long run. Well-nourished bodies cope better, so start with a good breakfast, add more organic fruits and vegetables for a well-balanced diet, avoid processed foods and sugar, try herbal tea and drink more water. 4. Connect with Supportive People Talking face to face with another person releases hormones that reduce stress. Lean on those good listeners in your life. 5. [place] Time Do you enjoy gardening, reading, listening to music or some other creative pursuit?  Engage in activities that bring you pleasure and joy; research shows that reduces stress by almost half and lowers your heart rate, too. 6. Practice Meditation, Stress Reduction or Yoga Relaxation techniques activate a state of restfulness that counterbalances your body's fight-or-flight hormones. Even if this also means a 10-minute break in a long day: listen to music, read, go for a walk in nature, do a hobby, take a bath or spend time with a friend. Also consider taking a mindfulness-based stress reduction course to learn effective, lasting tools or try a daily deep breathing or imagery practice. Deep Breathing Slow, calm and deep breathing can help you relax. Try these steps to focus on your breathing and repeat as needed. Find a comfortable position and close your eyes. Exhale and drop your shoulders. Breathe in through your nose; fill your lungs and then your belly. Think of relaxing your body, quieting your mind and becoming calm and peaceful. Breathe out slowly through your nose, relaxing your belly. Think of releasing tension, pain, worries or distress. Repeat steps three and four until you feel relaxed. Imagery This involves using your mind to excite the senses -- sound, vision, smell, taste and feeling. This may help ease your stress. Begin by getting comfortable and then do some slow breathing. Imagine a place you love being at. It could be somewhere from your childhood, somewhere you vacationed or just a place in your imagination. Feel how it is to be in the place you're imagining. Pay attention to the sounds, air, colors, and who is there with you. This is a place where you feel cared for and loved. All is well. You are safe. Take in all the smells, sounds, tastes and feelings. As you do, feel your body being nourished and healed. Feel the calm that surrounds you. Breathe in all the good. Breathe out any discomfort or tension. 7. Sleep Enough If you get less than seven to eight hours  of sleep, your body won't tolerate stress as well as it could. If stress keeps you up at night, address the cause and add extra meditation into your day to make up for the lost z's. Try to get seven to nine hours of sleep each night. Make a regular bedtime schedule. Keep your room dark and cool. Try to avoid computers, TV, cell phones and tablets before bed. 8. Bond with Connections You Enjoy Go out for a coffee with a friend, chat with a neighbor, call a  family member, visit with a clergy member, or even hang out with your pet. Clinical studies show that spending even a short time with a companion animal can cut anxiety levels almost in half. 9. Take a Vacation Getting away from it all can reset your stress tolerance by increasing your mental and emotional outlook, which makes you a happier, more productive person upon return. Leave your cellphone and laptop at home! 10. See a Counselor, Coach or Therapist If negative thoughts overwhelm your ability to make positive changes, it's time to seek professional help. Make an appointment today--your health and life are worth it.  Follow up:  Patient agrees to Care Plan and Follow-up.  Plan: The Managed Medicaid care management team will reach out to the patient again over the next 30 days.  Date/time of next scheduled Social Work care management/care coordination outreach:  02/19/22 at 2:30 pm.  Eula Fried, BSW, MSW, [place] Medicaid LCSW [email or web address] Phone: [phone]

## 2022-02-15 ENCOUNTER — Telehealth: Payer: Self-pay

## 2022-02-15 DIAGNOSIS — R197 Diarrhea, unspecified: Secondary | ICD-10-CM

## 2022-02-15 DIAGNOSIS — K219 Gastro-esophageal reflux disease without esophagitis: Secondary | ICD-10-CM

## 2022-02-15 MED ORDER — DEXLANSOPRAZOLE 60 MG PO CPDR: DELAYED_RELEASE_CAPSULE | ORAL | 3 refills | Status: DC

## 2022-02-15 NOTE — Telephone Encounter (Signed)
Refill request received from [facility] for Dexlansoprazole 60 mg dr capsules, qty:90. Pt's last visit was on 05/02/21 via video.

## 2022-02-15 NOTE — Addendum Note (Signed)
Addended by: Mahala Menghini on: 02/15/2022 01:33 PM   Modules accepted: Orders

## 2022-02-19 ENCOUNTER — Other Ambulatory Visit: Payer: Self-pay

## 2022-02-19 NOTE — Patient Outreach (Addendum)
[facility]) Care Management  02/19/2022  Shelia Magallon. 03-13-1976 [id number]  LCSW completed [facility] outreach attempt today during scheduled appointment time but was unable to reach patient successfully. A HIPPA compliant voice message was left encouraging patient to return call once available. LCSW will ask Scheduling Care Guide to reschedule [facility] SW appointment with patient as well. [facility] LCSW also completed care coordination call to [facility] but was unable to leave a message or reach anyone to inquire about patient's psychiatry referral.   Eula Fried, BSW, MSW, LCSW Managed Medicaid LCSW [email or web address] Phone: [phone]

## 2022-02-19 NOTE — Patient Instructions (Signed)
Christopher Wyatt. ,   The Medicaid Managed Care Team is available to provide assistance to you with your healthcare needs at no cost and as a benefit of your Medicaid Health plan. I'm sorry I was unable to reach you today for our scheduled appointment. Our care guide will call you to reschedule our telephone appointment. Please call me at the number below. I am available to be of assistance to you regarding your healthcare needs. .   Thank you,   Rayvn Rickerson, BSW, MSW, LCSW Managed Medicaid LCSW [place]  [facility] [email or web address] Phone: [phone]   

## 2022-03-08 ENCOUNTER — Ambulatory Visit: Payer: Medicaid Other | Admitting: Gastroenterology

## 2022-03-13 ENCOUNTER — Other Ambulatory Visit: Payer: Self-pay

## 2022-03-13 NOTE — Patient Outreach (Signed)
[facility]) Care Management  03/13/2022  Christopher Wyatt. 03/18/1976 [id number]  LCSW completed [facility] outreach attempt today during scheduled appointment time but was unable to reach patient successfully. A HIPPA compliant voice message was left encouraging patient to return call once available. LCSW will ask Scheduling Care Guide to reschedule [facility] SW appointment with patient as well.  Eula Fried, BSW, MSW, [employer] Managed Medicaid LCSW [email or web address] Phone: [phone]

## 2022-03-13 NOTE — Patient Instructions (Signed)
[place] Team is available to provide assistance to you with your healthcare needs at no cost and as a benefit of your [facility] Health plan. I'm sorry I was unable to reach you today for our scheduled appointment. Our care guide will call you to reschedule our telephone appointment. Please call me at the number below. I am available to be of assistance to you regarding your healthcare needs. .   Thank you,   Eula Fried, BSW, MSW, LCSW Managed Medicaid LCSW [email or web address] Phone: [phone]

## 2022-03-15 ENCOUNTER — Telehealth: Payer: Self-pay | Admitting: Internal Medicine

## 2022-03-15 NOTE — Telephone Encounter (Signed)
..   Medicaid Managed Care   Unsuccessful Outreach Note  03/15/2022 Name: Christopher Wyatt. MRN: [id number] DOB: 1976-07-03  Referred by: Lindell Spar, MD Reason for referral : High Risk Managed Medicaid (I called the patient on 03/14/22 to get him rescheduled with the MM LCSW. I left my name and number on his VM. This is a late entry.)   Third unsuccessful telephone outreach was attempted today. The patient was referred to the case management team for assistance with care management and care coordination. The patient's primary care provider has been notified of our unsuccessful attempts to make or maintain contact with the patient. The care management team is pleased to engage with this patient at any time in the future should he/she be interested in assistance from the care management team.   Follow Up Plan: We have been unable to make contact with the patient for follow up. The care management team is available to follow up with the patient after provider conversation with the patient regarding recommendation for care management engagement and subsequent re-referral to the care management team.   [place], [place]

## 2022-03-19 DIAGNOSIS — M79662 Pain in left lower leg: Secondary | ICD-10-CM | POA: Insufficient documentation

## 2022-03-21 ENCOUNTER — Ambulatory Visit (HOSPITAL_COMMUNITY)
Admission: RE | Admit: 2022-03-21 | Discharge: 2022-03-21 | Disposition: A | Discharge status: Home / Self Care | Payer: Medicaid Other | Source: Ambulatory Visit | Attending: Cardiology | Admitting: Cardiology

## 2022-03-21 ENCOUNTER — Other Ambulatory Visit (HOSPITAL_COMMUNITY): Payer: Self-pay | Admitting: Orthopedic Surgery

## 2022-03-21 DIAGNOSIS — M79605 Pain in left leg: Secondary | ICD-10-CM

## 2022-03-21 DIAGNOSIS — M7989 Other specified soft tissue disorders: Secondary | ICD-10-CM | POA: Diagnosis not present

## 2022-03-28 ENCOUNTER — Ambulatory Visit: Payer: Medicaid Other | Admitting: Internal Medicine

## 2022-04-05 ENCOUNTER — Telehealth: Payer: Self-pay | Admitting: Internal Medicine

## 2022-04-05 NOTE — Telephone Encounter (Signed)
Pt called stating he is wanting lab work ordered. He states he is just wanting to get everything checked? Please advise

## 2022-04-08 NOTE — Telephone Encounter (Signed)
There are labs entered for patient that was ordered in December. Patient has cancelled last two appointments with [facility]. Needs to schedule a follow up ASAP

## 2022-04-12 DIAGNOSIS — Z202 Contact with and (suspected) exposure to infections with a predominantly sexual mode of transmission: Secondary | ICD-10-CM | POA: Diagnosis not present

## 2022-04-12 DIAGNOSIS — Z139 Encounter for screening, unspecified: Secondary | ICD-10-CM | POA: Diagnosis not present

## 2022-04-12 DIAGNOSIS — Z7251 High risk heterosexual behavior: Secondary | ICD-10-CM | POA: Diagnosis not present

## 2022-04-13 LAB — LIPID PANEL
Chol/HDL Ratio: 3.8 ratio (ref 0.0–5.0)
Cholesterol, Total: 188 mg/dL (ref 100–199)
HDL: 50 mg/dL (ref >39)
LDL Chol Calc (NIH): 118 mg/dL — ABNORMAL HIGH (ref 0–99)
Triglycerides: 114 mg/dL (ref 0–149)
VLDL Cholesterol Cal: 20 mg/dL (ref 5–40)

## 2022-04-13 LAB — CMP14+EGFR
ALT: 45 IU/L — ABNORMAL HIGH (ref 0–44)
AST: 39 IU/L (ref 0–40)
Albumin/Globulin Ratio: 2.2 (ref 1.2–2.2)
Albumin: 4.8 g/dL (ref 4.1–5.1)
Alkaline Phosphatase: 85 IU/L (ref 44–121)
BUN/Creatinine Ratio: 5 — ABNORMAL LOW (ref 9–20)
BUN: 4 mg/dL — ABNORMAL LOW (ref 6–24)
Bilirubin Total: 0.6 mg/dL (ref 0.0–1.2)
CO2: 23 mmol/L (ref 20–29)
Calcium: 9.7 mg/dL (ref 8.7–10.2)
Chloride: 100 mmol/L (ref 96–106)
Creatinine, Ser: 0.81 mg/dL (ref 0.76–1.27)
Globulin, Total: 2.2 g/dL (ref 1.5–4.5)
Glucose: 87 mg/dL (ref 70–99)
Potassium: 4.3 mmol/L (ref 3.5–5.2)
Sodium: 140 mmol/L (ref 134–144)
Total Protein: 7 g/dL (ref 6.0–8.5)
eGFR: 111 mL/min/{1.73_m2} (ref >59)

## 2022-04-13 LAB — TSH: TSH: 1.64 u[IU]/mL (ref 0.450–4.500)

## 2022-04-13 LAB — VITAMIN D 25 HYDROXY (VIT D DEFICIENCY, FRACTURES): Vit D, 25-Hydroxy: 29 ng/mL — ABNORMAL LOW (ref 30.0–100.0)

## 2022-04-16 ENCOUNTER — Encounter: Payer: Self-pay | Admitting: Internal Medicine

## 2022-04-16 ENCOUNTER — Ambulatory Visit (INDEPENDENT_AMBULATORY_CARE_PROVIDER_SITE_OTHER): Payer: Medicaid Other | Admitting: Internal Medicine

## 2022-04-16 DIAGNOSIS — E559 Vitamin D deficiency, unspecified: Secondary | ICD-10-CM | POA: Diagnosis not present

## 2022-04-16 DIAGNOSIS — E782 Mixed hyperlipidemia: Secondary | ICD-10-CM | POA: Diagnosis not present

## 2022-04-16 DIAGNOSIS — Z1211 Encounter for screening for malignant neoplasm of colon: Secondary | ICD-10-CM | POA: Diagnosis not present

## 2022-04-16 DIAGNOSIS — F418 Other specified anxiety disorders: Secondary | ICD-10-CM

## 2022-04-16 NOTE — Assessment & Plan Note (Signed)
Advised to take Vitamin D 1000 IU QD

## 2022-04-16 NOTE — Assessment & Plan Note (Signed)
Lipid profile reviewed Advised to follow low cholesterol diet for now

## 2022-04-16 NOTE — Patient Instructions (Signed)
Please take Vitamin D 1000 IU once daily.  Please follow low cholesterol diet and perform moderate exercise/walking at least 150 mins/week.

## 2022-04-16 NOTE — Assessment & Plan Note (Signed)
Has tried multiple SSRIs in the past, including Zoloft, Celexa and Wellbutrin -caused drowsiness and feeling spacey; Paxil caused anorgasmia Chart review suggests that he had good response with Effexor in the past, started Effexor for now, but did not continue Had started Vistaril as needed for anxiety spells Avoid BZD's for now

## 2022-04-16 NOTE — Progress Notes (Signed)
Virtual Visit via Telephone Note   This visit type was conducted due to national recommendations for restrictions regarding the COVID-19 Pandemic (e.g. social distancing) in an effort to limit this patient's exposure and mitigate transmission in our community.  Due to his co-morbid illnesses, this patient is at least at moderate risk for complications without adequate follow up.  This format is felt to be most appropriate for this patient at this time.  The patient did not have access to video technology/had technical difficulties with video requiring transitioning to audio format only (telephone).  All issues noted in this document were discussed and addressed.  No physical exam could be performed with this format.  Evaluation Performed:  Follow-up visit  Date:  04/16/2022   ID:  Christopher Ahr., DOB [birth year], MRN [id number]  Patient Location: Home Provider Location: Office/Clinic  Participants: Patient Location of Patient: Home Location of Provider: Telehealth Consent was obtain for visit to be over via telehealth. I verified that I am speaking with the correct person using two identifiers.  PCP:  Christopher Spar, MD   Chief Complaint: F/u of anxiety and blood tests  History of Present Illness:    Christopher Loyal. is a 46 y.o. male with PMH of GAD and tobacco abuse who has a televisit for follow up of anxiety and recent blood tests.  GAD: He was given Effexor and as needed Vistaril for GAD.  He has stopped taking Effexor as he was feeling better and was worried about weight gain from Effexor.  He still has insomnia, but denies any anhedonia, SI or HI currently.  Blood tests were reviewed and discussed with the patient in detail.  The patient does not have symptoms concerning for COVID-19 infection (fever, chills, cough, or new shortness of breath).   Past Medical, Surgical, Social History, Allergies, and Medications have been Reviewed.  Past Medical History:   Diagnosis Date   Anxiety    Chronic   GERD (gastroesophageal reflux disease)    Headache    Neuroma    OCD (obsessive compulsive disorder)    Past Surgical History:  Procedure Laterality Date   BIOPSY  06/11/2021   Procedure: BIOPSY;  Surgeon: Daneil Dolin, MD;  [facility];  Service: Endoscopy;;   CIRCUMCISION  2004   ESOPHAGOGASTRODUODENOSCOPY (EGD) WITH PROPOFOL N/A 08/10/2020   Procedure: ESOPHAGOGASTRODUODENOSCOPY (EGD) WITH PROPOFOL;  Surgeon: Daneil Dolin, MD;  [facility];  Service: Endoscopy;  Laterality: N/A;  1:45pm   ESOPHAGOGASTRODUODENOSCOPY (EGD) WITH PROPOFOL N/A 06/11/2021   Procedure: ESOPHAGOGASTRODUODENOSCOPY (EGD) WITH PROPOFOL;  Surgeon: Daneil Dolin, MD;  [facility];  Service: Endoscopy;  Laterality: N/A;  10:30am   EXCISION MORTON'S NEUROMA  10/16/2020   Procedure: EXCISION MORTON'S NEUROMA,AND REMOVAL OF INGROWN NAIL-GREAT TOES LEFT;  Surgeon: Felipa Furnace, DPM;  Location: [place];  Service: Podiatry;;   WISDOM TOOTH EXTRACTION  2011     Current Meds  Medication Sig   cyclobenzaprine (FLEXERIL) 5 MG tablet Take 1 tablet (5 mg total) by mouth 3 (three) times daily as needed for muscle spasms.   dexlansoprazole (DEXILANT) 60 MG capsule TAKE 1 CAPSULE(60 MG) BY MOUTH DAILY   meloxicam (MOBIC) 15 MG tablet Take 15 mg by mouth daily as needed.     Allergies:   Cat hair extract and Paroxetine hcl   ROS:   Please see the history of present illness.     All other  systems reviewed and are negative.   Labs/Other Tests and Data Reviewed:    Recent Labs: 05/30/2021: Hemoglobin 17.6; Platelets 214 04/12/2022: ALT 45; BUN 4; Creatinine, Ser 0.81; Potassium 4.3; Sodium 140; TSH 1.640   Recent Lipid Panel Lab Results  Component Value Date/Time   CHOL 188 04/12/2022 09:50 AM   TRIG 114 04/12/2022 09:50 AM   HDL 50 04/12/2022 09:50 AM   CHOLHDL 3.8 04/12/2022 09:50 AM   CHOLHDL 2.2 03/26/2016 04:50  PM   LDLCALC 118 (H) 04/12/2022 09:50 AM    Wt Readings from Last 3 Encounters:  01/09/22 193 lb (87.5 kg)  12/27/21 193 lb 3.2 oz (87.6 kg)  08/20/21 194 lb (88 kg)     ASSESSMENT & PLAN:    Mixed anxiety and depressive disorder Has tried multiple SSRIs in the past, including Zoloft, Celexa and Wellbutrin -caused drowsiness and feeling spacey; Paxil caused anorgasmia Chart review suggests that he had good response with Effexor in the past, started Effexor for now, but did not continue Had started Vistaril as needed for anxiety spells Avoid BZD's for now  Mixed hyperlipidemia Lipid profile reviewed Advised to follow low cholesterol diet for now  Vitamin D deficiency Advised to take Vitamin D 1000 IU QD    Time:   Today, I have spent 15 minutes reviewing the chart, including problem list, medications, and with the patient with telehealth technology discussing the above problems.   Medication Adjustments/Labs and Tests Ordered: Current medicines are reviewed at length with the patient today.  Concerns regarding medicines are outlined above.   Tests Ordered: Orders Placed This Encounter  Procedures   Ambulatory referral to Gastroenterology    Medication Changes: No orders of the defined types were placed in this encounter.    Note: This dictation was prepared with Dragon dictation along with smaller phrase technology. Similar sounding words can be transcribed inadequately or may not be corrected upon review. Any transcriptional errors that result from this process are unintentional.      Disposition:  Follow up  Signed, Christopher Spar, MD  04/16/2022 9:56 AM     [facility]

## 2022-04-22 ENCOUNTER — Encounter: Payer: Self-pay | Admitting: Internal Medicine

## 2022-04-22 ENCOUNTER — Ambulatory Visit (INDEPENDENT_AMBULATORY_CARE_PROVIDER_SITE_OTHER): Payer: Medicaid Other | Admitting: Internal Medicine

## 2022-04-22 DIAGNOSIS — K529 Noninfective gastroenteritis and colitis, unspecified: Secondary | ICD-10-CM | POA: Diagnosis not present

## 2022-04-22 MED ORDER — ONDANSETRON HCL 4 MG PO TABS: 4.0000 mg | ORAL_TABLET | Freq: Three times a day (TID) | ORAL | 0 refills | Status: DC | PRN

## 2022-04-22 NOTE — Progress Notes (Signed)
Virtual Visit via Telephone Note   This visit type was conducted due to national recommendations for restrictions regarding the COVID-19 Pandemic (e.g. social distancing) in an effort to limit this patient's exposure and mitigate transmission in our community.  Due to his co-morbid illnesses, this patient is at least at moderate risk for complications without adequate follow up.  This format is felt to be most appropriate for this patient at this time.  The patient did not have access to video technology/had technical difficulties with video requiring transitioning to audio format only (telephone).  All issues noted in this document were discussed and addressed.  No physical exam could be performed with this format.  Evaluation Performed:  Follow-up visit  Date:  04/22/2022   ID:  Christopher Ahr., DOB 1976-01-23, MRN [id number]  Patient Location: Home Provider Location: Office/Clinic  Participants: Patient Location of Patient: Home Location of Provider: Telehealth Consent was obtain for visit to be over via telehealth. I verified that I am speaking with the correct person using two identifiers.  PCP:  Lindell Spar, MD   Chief Complaint: Nausea/vomiting and diarrhea  History of Present Illness:    Christopher Holliman. is a 46 y.o. male who has a televisit for complaint of nausea/vomiting and watery diarrhea X 2 since this morning.  He had steak and rice yesterday at a restaurant.  He denies any melena, hematochezia or hematemesis.  Denies any fever or chills currently.  The patient does not have symptoms concerning for COVID-19 infection (fever, chills, cough, or new shortness of breath).   Past Medical, Surgical, Social History, Allergies, and Medications have been Reviewed.  Past Medical History:  Diagnosis Date   Anxiety    Chronic   GERD (gastroesophageal reflux disease)    Headache    Neuroma    OCD (obsessive compulsive disorder)    Past Surgical History:   Procedure Laterality Date   BIOPSY  06/11/2021   Procedure: BIOPSY;  Surgeon: Daneil Dolin, MD;  [facility];  Service: Endoscopy;;   CIRCUMCISION  2004   ESOPHAGOGASTRODUODENOSCOPY (EGD) WITH PROPOFOL N/A 08/10/2020   Procedure: ESOPHAGOGASTRODUODENOSCOPY (EGD) WITH PROPOFOL;  Surgeon: Daneil Dolin, MD;  [facility];  Service: Endoscopy;  Laterality: N/A;  1:45pm   ESOPHAGOGASTRODUODENOSCOPY (EGD) WITH PROPOFOL N/A 06/11/2021   Procedure: ESOPHAGOGASTRODUODENOSCOPY (EGD) WITH PROPOFOL;  Surgeon: Daneil Dolin, MD;  [facility];  Service: Endoscopy;  Laterality: N/A;  10:30am   EXCISION MORTON'S NEUROMA  10/16/2020   Procedure: EXCISION MORTON'S NEUROMA,AND REMOVAL OF INGROWN NAIL-GREAT TOES LEFT;  Surgeon: Felipa Furnace, DPM;  Location: [place];  Service: Podiatry;;   WISDOM TOOTH EXTRACTION  2011     Current Meds  Medication Sig   cyclobenzaprine (FLEXERIL) 5 MG tablet Take 1 tablet (5 mg total) by mouth 3 (three) times daily as needed for muscle spasms.   dexlansoprazole (DEXILANT) 60 MG capsule TAKE 1 CAPSULE(60 MG) BY MOUTH DAILY   hydrOXYzine (VISTARIL) 25 MG capsule Take 1 capsule (25 mg total) by mouth every 8 (eight) hours as needed for anxiety.   meloxicam (MOBIC) 15 MG tablet Take 15 mg by mouth daily as needed.   venlafaxine (EFFEXOR) 37.5 MG tablet Take 1 tablet (37.5 mg total) by mouth 2 (two) times daily with a meal.     Allergies:   Cat hair extract and Paroxetine hcl   ROS:   Please see the history of present  illness.     All other systems reviewed and are negative.   Labs/Other Tests and Data Reviewed:    Recent Labs: 05/30/2021: Hemoglobin 17.6; Platelets 214 04/12/2022: ALT 45; BUN 4; Creatinine, Ser 0.81; Potassium 4.3; Sodium 140; TSH 1.640   Recent Lipid Panel Lab Results  Component Value Date/Time   CHOL 188 04/12/2022 09:50 AM   TRIG 114 04/12/2022 09:50 AM   HDL 50 04/12/2022 09:50 AM    CHOLHDL 3.8 04/12/2022 09:50 AM   CHOLHDL 2.2 03/26/2016 04:50 PM   LDLCALC 118 (H) 04/12/2022 09:50 AM    Wt Readings from Last 3 Encounters:  01/09/22 193 lb (87.5 kg)  12/27/21 193 lb 3.2 oz (87.6 kg)  08/20/21 194 lb (88 kg)     ASSESSMENT & PLAN:    Acute gastroenteritis Most of the gastroenteritis are self resolving - advised to maintain adequate hydration Zofran as needed for nausea or vomiting Advised to contact if he has persistent symptoms after 3 days   Time:   Today, I have spent 9 minutes reviewing the chart, including problem list, medications, and with the patient with telehealth technology discussing the above problems.   Medication Adjustments/Labs and Tests Ordered: Current medicines are reviewed at length with the patient today.  Concerns regarding medicines are outlined above.   Tests Ordered: No orders of the defined types were placed in this encounter.   Medication Changes: No orders of the defined types were placed in this encounter.    Note: This dictation was prepared with Dragon dictation along with smaller phrase technology. Similar sounding words can be transcribed inadequately or may not be corrected upon review. Any transcriptional errors that result from this process are unintentional.      Disposition:  Follow up  Signed, Lindell Spar, MD  04/22/2022 10:47 AM     [place]

## 2022-04-24 DIAGNOSIS — R0781 Pleurodynia: Secondary | ICD-10-CM | POA: Insufficient documentation

## 2022-05-02 DIAGNOSIS — M6281 Muscle weakness (generalized): Secondary | ICD-10-CM | POA: Insufficient documentation

## 2022-06-10 ENCOUNTER — Ambulatory Visit: Payer: Medicaid Other | Admitting: Internal Medicine

## 2022-06-10 ENCOUNTER — Encounter: Payer: Self-pay | Admitting: Internal Medicine

## 2022-06-10 DIAGNOSIS — U071 COVID-19: Secondary | ICD-10-CM | POA: Diagnosis not present

## 2022-06-10 MED ORDER — NOREL AD 4-10-325 MG PO TABS: 1.0000 | ORAL_TABLET | Freq: Three times a day (TID) | ORAL | 0 refills | Status: DC | PRN

## 2022-06-10 MED ORDER — NIRMATRELVIR/RITONAVIR (PAXLOVID)TABLET: 3.0000 | ORAL_TABLET | Freq: Two times a day (BID) | ORAL | 0 refills | Status: AC

## 2022-06-10 NOTE — Progress Notes (Signed)
Virtual Visit via Telephone Note   This visit type was conducted due to national recommendations for restrictions regarding the COVID-19 Pandemic (e.g. social distancing) in an effort to limit this patient's exposure and mitigate transmission in our community.  Due to his co-morbid illnesses, this patient is at least at moderate risk for complications without adequate follow up.  This format is felt to be most appropriate for this patient at this time.  The patient did not have access to video technology/had technical difficulties with video requiring transitioning to audio format only (telephone).  All issues noted in this document were discussed and addressed.  No physical exam could be performed with this format.  Evaluation Performed:  Follow-up visit  Date:  06/10/2022   ID:  Christopher Ahr., DOB 12/11/1975, MRN [id number]  Patient Location: Home Provider Location: Office/Clinic  Participants: Patient Location of Patient: Home Location of Provider: Telehealth Consent was obtain for visit to be over via telehealth. I verified that I am speaking with the correct person using two identifiers.  PCP:  Lindell Spar, MD   Chief Complaint: Cough, fever and chills  History of Present Illness:    Christopher Leib. is a 46 y.o. male who has a televisit for complaint of cough, fever, chills and nasal congestion X 2 days.  He had positive COVID test at home.  He also reports fatigue and myalgias, for which he has tried taking Tylenol without much relief.  Denies any chest pain, wheezing or palpitations.  The patient does have symptoms concerning for COVID-19 infection (fever, chills, cough, or new shortness of breath).   Past Medical, Surgical, Social History, Allergies, and Medications have been Reviewed.  Past Medical History:  Diagnosis Date   Anxiety    Chronic   GERD (gastroesophageal reflux disease)    Headache    Neuroma    OCD (obsessive compulsive disorder)     Past Surgical History:  Procedure Laterality Date   BIOPSY  06/11/2021   Procedure: BIOPSY;  Surgeon: Daneil Dolin, MD;  [facility];  Service: Endoscopy;;   CIRCUMCISION  2004   ESOPHAGOGASTRODUODENOSCOPY (EGD) WITH PROPOFOL N/A 08/10/2020   Procedure: ESOPHAGOGASTRODUODENOSCOPY (EGD) WITH PROPOFOL;  Surgeon: Daneil Dolin, MD;  [facility];  Service: Endoscopy;  Laterality: N/A;  1:45pm   ESOPHAGOGASTRODUODENOSCOPY (EGD) WITH PROPOFOL N/A 06/11/2021   Procedure: ESOPHAGOGASTRODUODENOSCOPY (EGD) WITH PROPOFOL;  Surgeon: Daneil Dolin, MD;  [facility];  Service: Endoscopy;  Laterality: N/A;  10:30am   EXCISION MORTON'S NEUROMA  10/16/2020   Procedure: EXCISION MORTON'S NEUROMA,AND REMOVAL OF INGROWN NAIL-GREAT TOES LEFT;  Surgeon: Felipa Furnace, DPM;  Location: [place];  Service: Podiatry;;   WISDOM TOOTH EXTRACTION  2011     Current Meds  Medication Sig   cyclobenzaprine (FLEXERIL) 5 MG tablet Take 1 tablet (5 mg total) by mouth 3 (three) times daily as needed for muscle spasms.   dexlansoprazole (DEXILANT) 60 MG capsule TAKE 1 CAPSULE(60 MG) BY MOUTH DAILY   hydrOXYzine (VISTARIL) 25 MG capsule Take 1 capsule (25 mg total) by mouth every 8 (eight) hours as needed for anxiety.   meloxicam (MOBIC) 15 MG tablet Take 15 mg by mouth daily as needed.   ondansetron (ZOFRAN) 4 MG tablet Take 1 tablet (4 mg total) by mouth every 8 (eight) hours as needed for nausea or vomiting.   venlafaxine (EFFEXOR) 37.5 MG tablet Take 1 tablet (37.5 mg total)  by mouth 2 (two) times daily with a meal.     Allergies:   Cat hair extract and Paroxetine hcl   ROS:   Please see the history of present illness.     All other systems reviewed and are negative.   Labs/Other Tests and Data Reviewed:    Recent Labs: 04/12/2022: ALT 45; BUN 4; Creatinine, Ser 0.81; Potassium 4.3; Sodium 140; TSH 1.640   Recent Lipid Panel Lab Results  Component Value  Date/Time   CHOL 188 04/12/2022 09:50 AM   TRIG 114 04/12/2022 09:50 AM   HDL 50 04/12/2022 09:50 AM   CHOLHDL 3.8 04/12/2022 09:50 AM   CHOLHDL 2.2 03/26/2016 04:50 PM   LDLCALC 118 (H) 04/12/2022 09:50 AM    Wt Readings from Last 3 Encounters:  01/09/22 193 lb (87.5 kg)  12/27/21 193 lb 3.2 oz (87.6 kg)  08/20/21 194 lb (88 kg)     ASSESSMENT & PLAN:    COVID 19 infection Started Paxlovid Norel AD as needed for nasal congestion Mucinex or Robitussin as needed for cough Advised to self quarantine at least for 5 days from symptom onset or until at least 24 hours afebrile period, which ever is later Advised to avoid smoking  Time:   Today, I have spent 11 minutes reviewing the chart, including problem list, medications, and with the patient with telehealth technology discussing the above problems.   Medication Adjustments/Labs and Tests Ordered: Current medicines are reviewed at length with the patient today.  Concerns regarding medicines are outlined above.   Tests Ordered: No orders of the defined types were placed in this encounter.   Medication Changes: No orders of the defined types were placed in this encounter.    Note: This dictation was prepared with Dragon dictation along with smaller phrase technology. Similar sounding words can be transcribed inadequately or may not be corrected upon review. Any transcriptional errors that result from this process are unintentional.      Disposition:  Follow up  Signed, Lindell Spar, MD  06/10/2022 9:23 AM     [place]

## 2022-08-09 ENCOUNTER — Ambulatory Visit: Payer: Medicaid Other | Admitting: Internal Medicine

## 2022-08-20 ENCOUNTER — Encounter: Payer: Self-pay | Admitting: Family Medicine

## 2022-08-20 ENCOUNTER — Ambulatory Visit (INDEPENDENT_AMBULATORY_CARE_PROVIDER_SITE_OTHER): Payer: Medicaid Other | Admitting: Family Medicine

## 2022-08-20 DIAGNOSIS — R058 Other specified cough: Secondary | ICD-10-CM | POA: Diagnosis not present

## 2022-08-20 DIAGNOSIS — J209 Acute bronchitis, unspecified: Secondary | ICD-10-CM | POA: Diagnosis not present

## 2022-08-20 MED ORDER — METHYLPREDNISOLONE 4 MG PO TBPK: ORAL_TABLET | ORAL | 0 refills | Status: DC

## 2022-08-20 MED ORDER — PROMETHAZINE-DM 6.25-15 MG/5ML PO SYRP: 5.0000 mL | ORAL_SOLUTION | Freq: Four times a day (QID) | ORAL | 0 refills | Status: DC | PRN

## 2022-08-20 MED ORDER — AZITHROMYCIN 250 MG PO TABS: ORAL_TABLET | ORAL | 0 refills | Status: AC

## 2022-08-20 NOTE — Progress Notes (Signed)
Virtual Visit via Telephone Note   This visit type was conducted via telephone. This format is felt to be most appropriate for this patient at this time.  The patient did not have access to video technology/had technical difficulties with video requiring transitioning to audio format only (telephone).  All issues noted in this document were discussed and addressed.  No physical exam could be performed with this format.  Evaluation Performed:  Follow-up visit  Date:  08/20/2022   ID:  Christopher Ahr., DOB September 16, 1975, MRN [id number]  Patient Location: Home Provider Location: Clinic  Participants: Patient Location of Patient: Home Location of Provider: Clinic Consent was obtain for visit to be over via telehealth. I verified that I am speaking with the correct person using two identifiers.  PCP:  Lindell Spar, MD   Chief Complaint: Bronchitis  History of Present Illness:    Christopher Cerreta. is a 46 y.o. male with past medical history of tobacco use with complaints of cough with dyspnea, chest congestion, and rhinorrhea.  He reports smoking half a pack of cigarettes daily.  He complains of shortness of breath.  He reports that both his parents were recently diagnosed with bronchitis.  He denies fever, chills, and sore throat.  The patient does have symptoms concerning for COVID-19 infection (fever, chills, cough, or new shortness of breath).   Past Medical, Surgical, Social History, Allergies, and Medications have been Reviewed.  Past Medical History:  Diagnosis Date   Anxiety    Chronic   GERD (gastroesophageal reflux disease)    Headache    Neuroma    OCD (obsessive compulsive disorder)    Past Surgical History:  Procedure Laterality Date   BIOPSY  06/11/2021   Procedure: BIOPSY;  Surgeon: Daneil Dolin, MD;  [facility];  Service: Endoscopy;;   CIRCUMCISION  2004   ESOPHAGOGASTRODUODENOSCOPY (EGD) WITH PROPOFOL N/A 08/10/2020   Procedure:  ESOPHAGOGASTRODUODENOSCOPY (EGD) WITH PROPOFOL;  Surgeon: Daneil Dolin, MD;  [facility];  Service: Endoscopy;  Laterality: N/A;  1:45pm   ESOPHAGOGASTRODUODENOSCOPY (EGD) WITH PROPOFOL N/A 06/11/2021   Procedure: ESOPHAGOGASTRODUODENOSCOPY (EGD) WITH PROPOFOL;  Surgeon: Daneil Dolin, MD;  [facility];  Service: Endoscopy;  Laterality: N/A;  10:30am   EXCISION MORTON'S NEUROMA  10/16/2020   Procedure: EXCISION MORTON'S NEUROMA,AND REMOVAL OF INGROWN NAIL-GREAT TOES LEFT;  Surgeon: Felipa Furnace, DPM;  Location: [place];  Service: Podiatry;;   WISDOM TOOTH EXTRACTION  2011     Current Meds  Medication Sig   azithromycin (ZITHROMAX) 250 MG tablet Take 2 tablets on day 1, then 1 tablet daily on days 2 through 5   Chlorphen-PE-Acetaminophen (NOREL AD) 4-10-325 MG TABS Take 1 tablet by mouth 3 (three) times daily as needed.   cyclobenzaprine (FLEXERIL) 5 MG tablet Take 1 tablet (5 mg total) by mouth 3 (three) times daily as needed for muscle spasms.   dexlansoprazole (DEXILANT) 60 MG capsule TAKE 1 CAPSULE(60 MG) BY MOUTH DAILY   hydrOXYzine (VISTARIL) 25 MG capsule Take 1 capsule (25 mg total) by mouth every 8 (eight) hours as needed for anxiety.   meloxicam (MOBIC) 15 MG tablet Take 15 mg by mouth daily as needed.   methylPREDNISolone (MEDROL DOSEPAK) 4 MG TBPK tablet Take as the package instructed   ondansetron (ZOFRAN) 4 MG tablet Take 1 tablet (4 mg total) by mouth every 8 (eight) hours as needed for nausea or vomiting.  promethazine-dextromethorphan (PROMETHAZINE-DM) 6.25-15 MG/5ML syrup Take 5 mLs by mouth 4 (four) times daily as needed for cough.   venlafaxine (EFFEXOR) 37.5 MG tablet Take 1 tablet (37.5 mg total) by mouth 2 (two) times daily with a meal.     Allergies:   Cat hair extract and Paroxetine hcl   ROS:   Please see the history of present illness.     All other systems reviewed and are negative.   Labs/Other Tests and Data  Reviewed:    Recent Labs: 04/12/2022: ALT 45; BUN 4; Creatinine, Ser 0.81; Potassium 4.3; Sodium 140; TSH 1.640   Recent Lipid Panel Lab Results  Component Value Date/Time   CHOL 188 04/12/2022 09:50 AM   TRIG 114 04/12/2022 09:50 AM   HDL 50 04/12/2022 09:50 AM   CHOLHDL 3.8 04/12/2022 09:50 AM   CHOLHDL 2.2 03/26/2016 04:50 PM   LDLCALC 118 (H) 04/12/2022 09:50 AM    Wt Readings from Last 3 Encounters:  01/09/22 193 lb (87.5 kg)  12/27/21 193 lb 3.2 oz (87.6 kg)  08/20/21 194 lb (88 kg)     Objective:    Vital Signs:  There were no vitals taken for this visit.     ASSESSMENT & PLAN:   Acute bronchitis Will treat with Medrol Dosepak Azithromycin ordered for anaerobic coverage Encouraged to stay well-hydrated  Promethazine DM ordered for cough Encouraged to take Tylenol as needed for fever, body aches, and headaches Time:   Today, I have spent 15 minutes reviewing the chart, including problem list, medications, and with the patient with telehealth technology discussing the above problems.   Medication Adjustments/Labs and Tests Ordered: Current medicines are reviewed at length with the patient today.  Concerns regarding medicines are outlined above.   Tests Ordered: No orders of the defined types were placed in this encounter.   Medication Changes: Meds ordered this encounter  Medications   methylPREDNISolone (MEDROL DOSEPAK) 4 MG TBPK tablet    Sig: Take as the package instructed    Dispense:  1 each    Refill:  0   azithromycin (ZITHROMAX) 250 MG tablet    Sig: Take 2 tablets on day 1, then 1 tablet daily on days 2 through 5    Dispense:  6 tablet    Refill:  0   promethazine-dextromethorphan (PROMETHAZINE-DM) 6.25-15 MG/5ML syrup    Sig: Take 5 mLs by mouth 4 (four) times daily as needed for cough.    Dispense:  118 mL    Refill:  0     Note: This dictation was prepared with Dragon dictation along with smaller phrase technology. Similar sounding words  can be transcribed inadequately or may not be corrected upon review. Any transcriptional errors that result from this process are unintentional.      Disposition:  Follow up  Signed, Alvira Monday, FNP  08/20/2022 1:29 PM     [facility]

## 2022-10-03 IMAGING — DX DG SHOULDER 2+V*R*
3 series · 3 of 3 positions shown · non-contrast
Comparison: Chest x-ray 06/11/2018.

CLINICAL DATA: Right shoulder pain.

EXAM:
RIGHT SHOULDER - 2+ VIEW

[shoulder internal rotation ap]
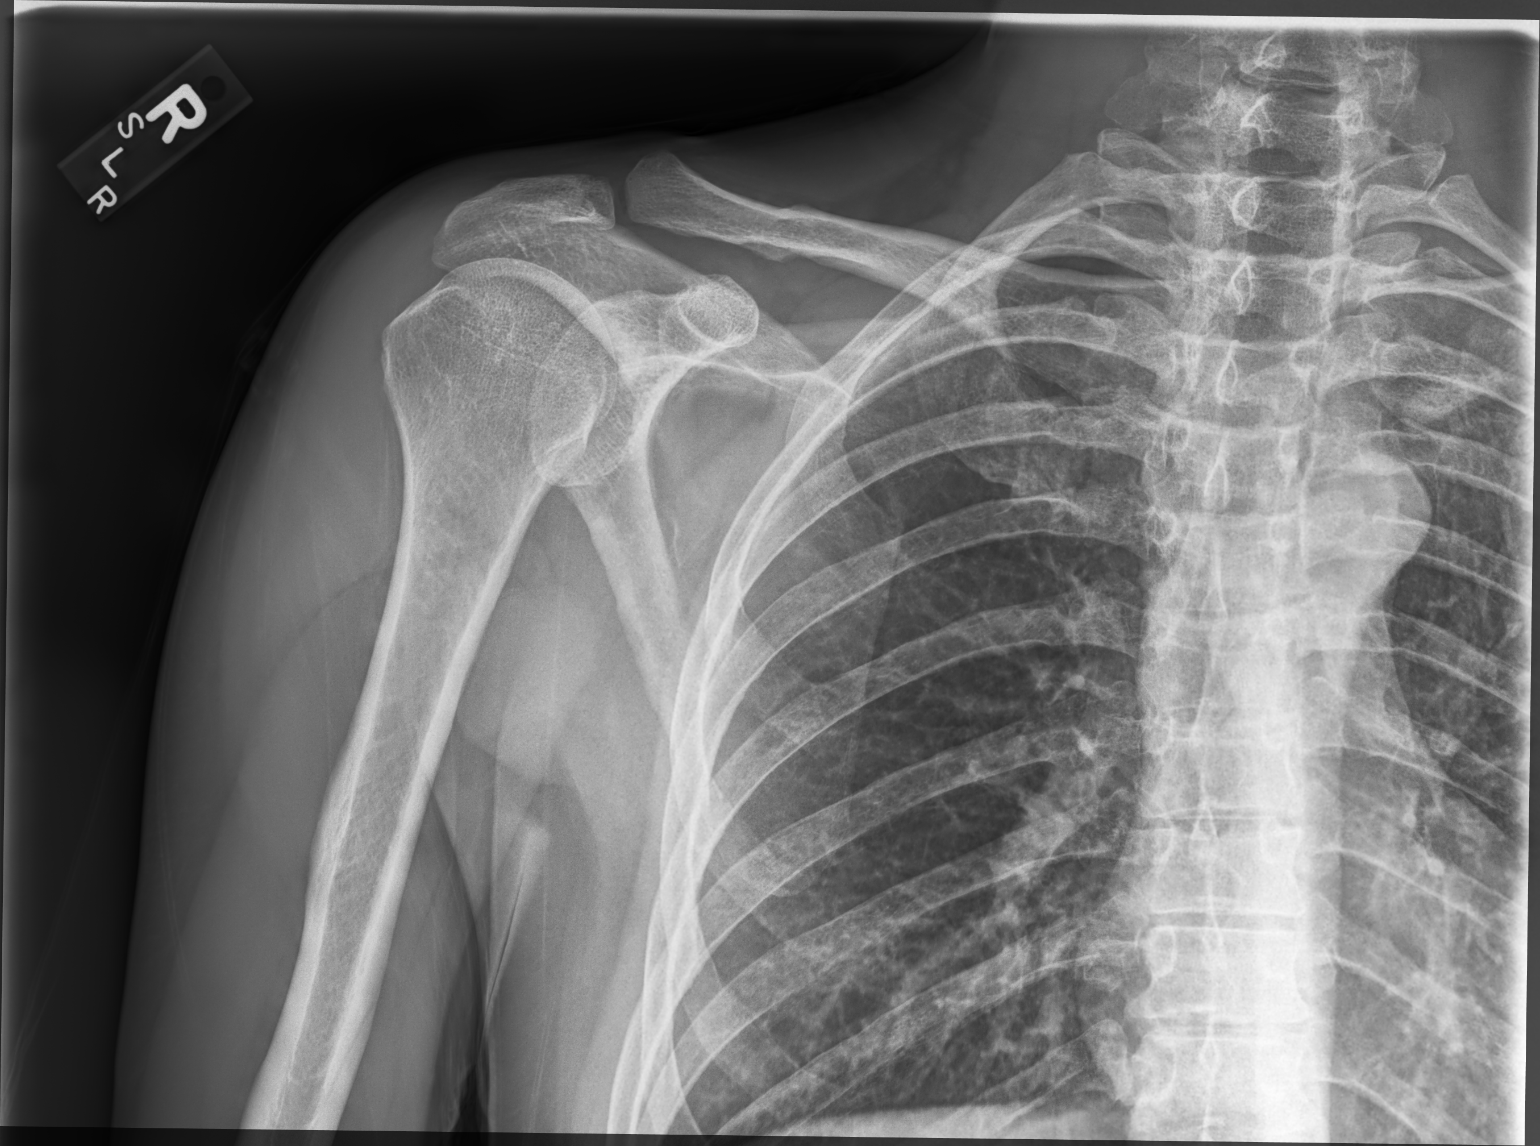

[shoulder external rotation ap]
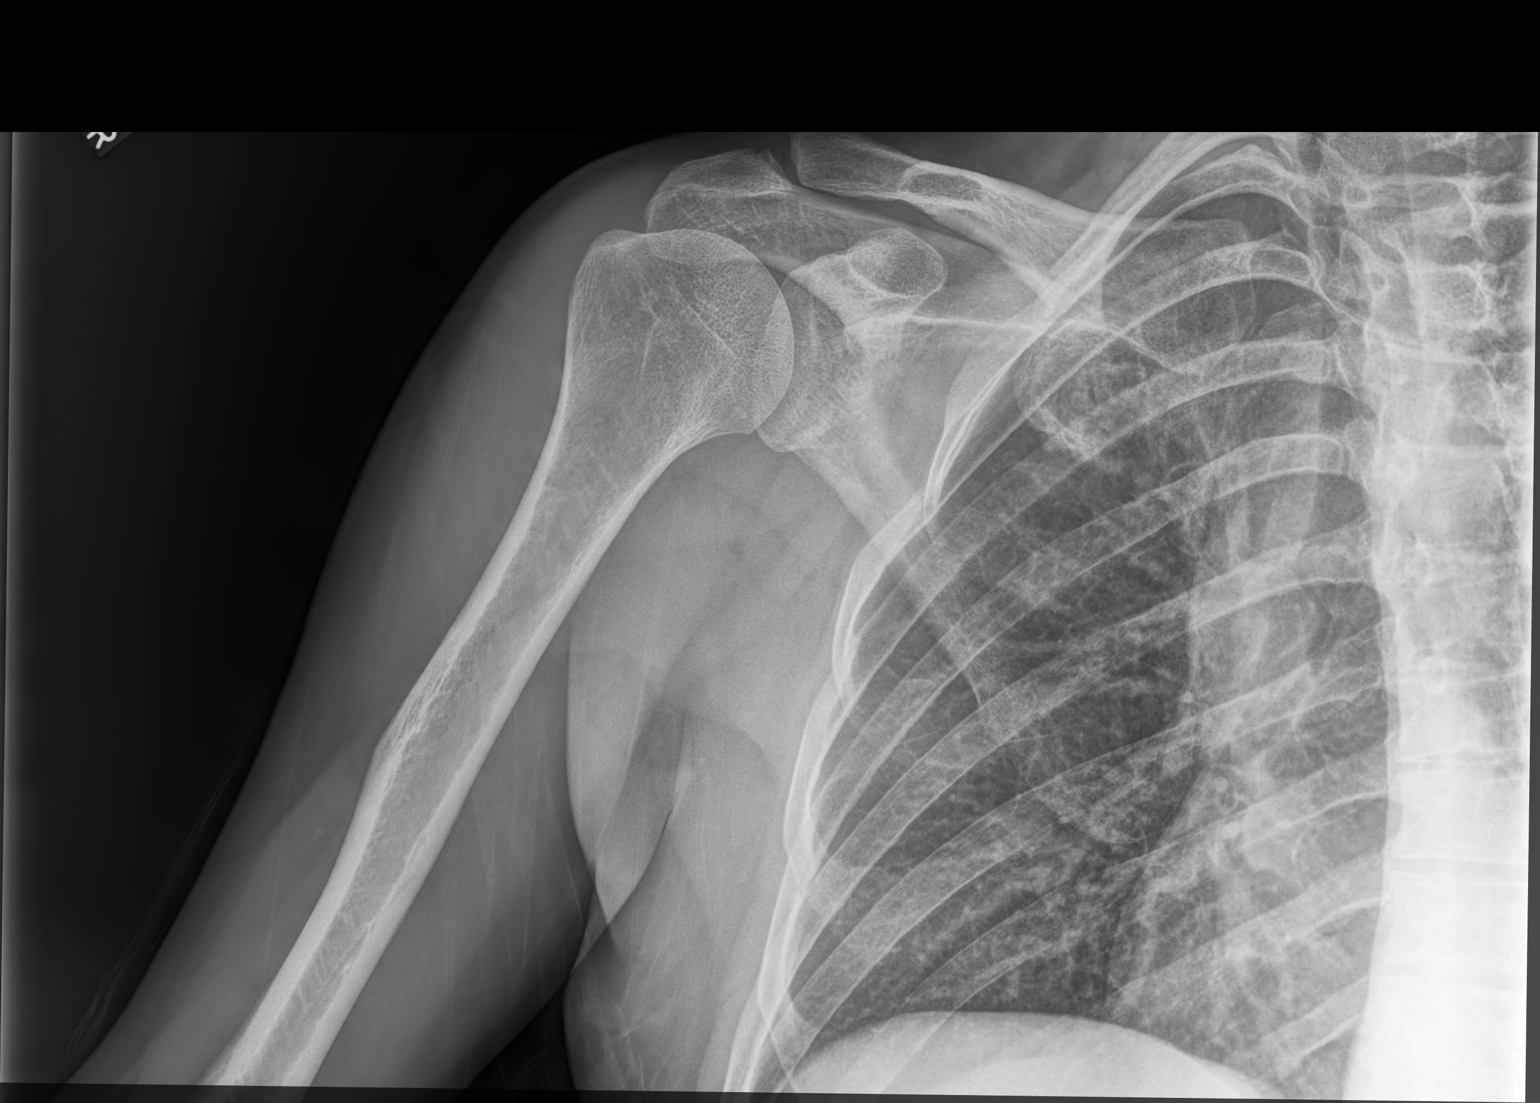

[shoulder (y view) lat]
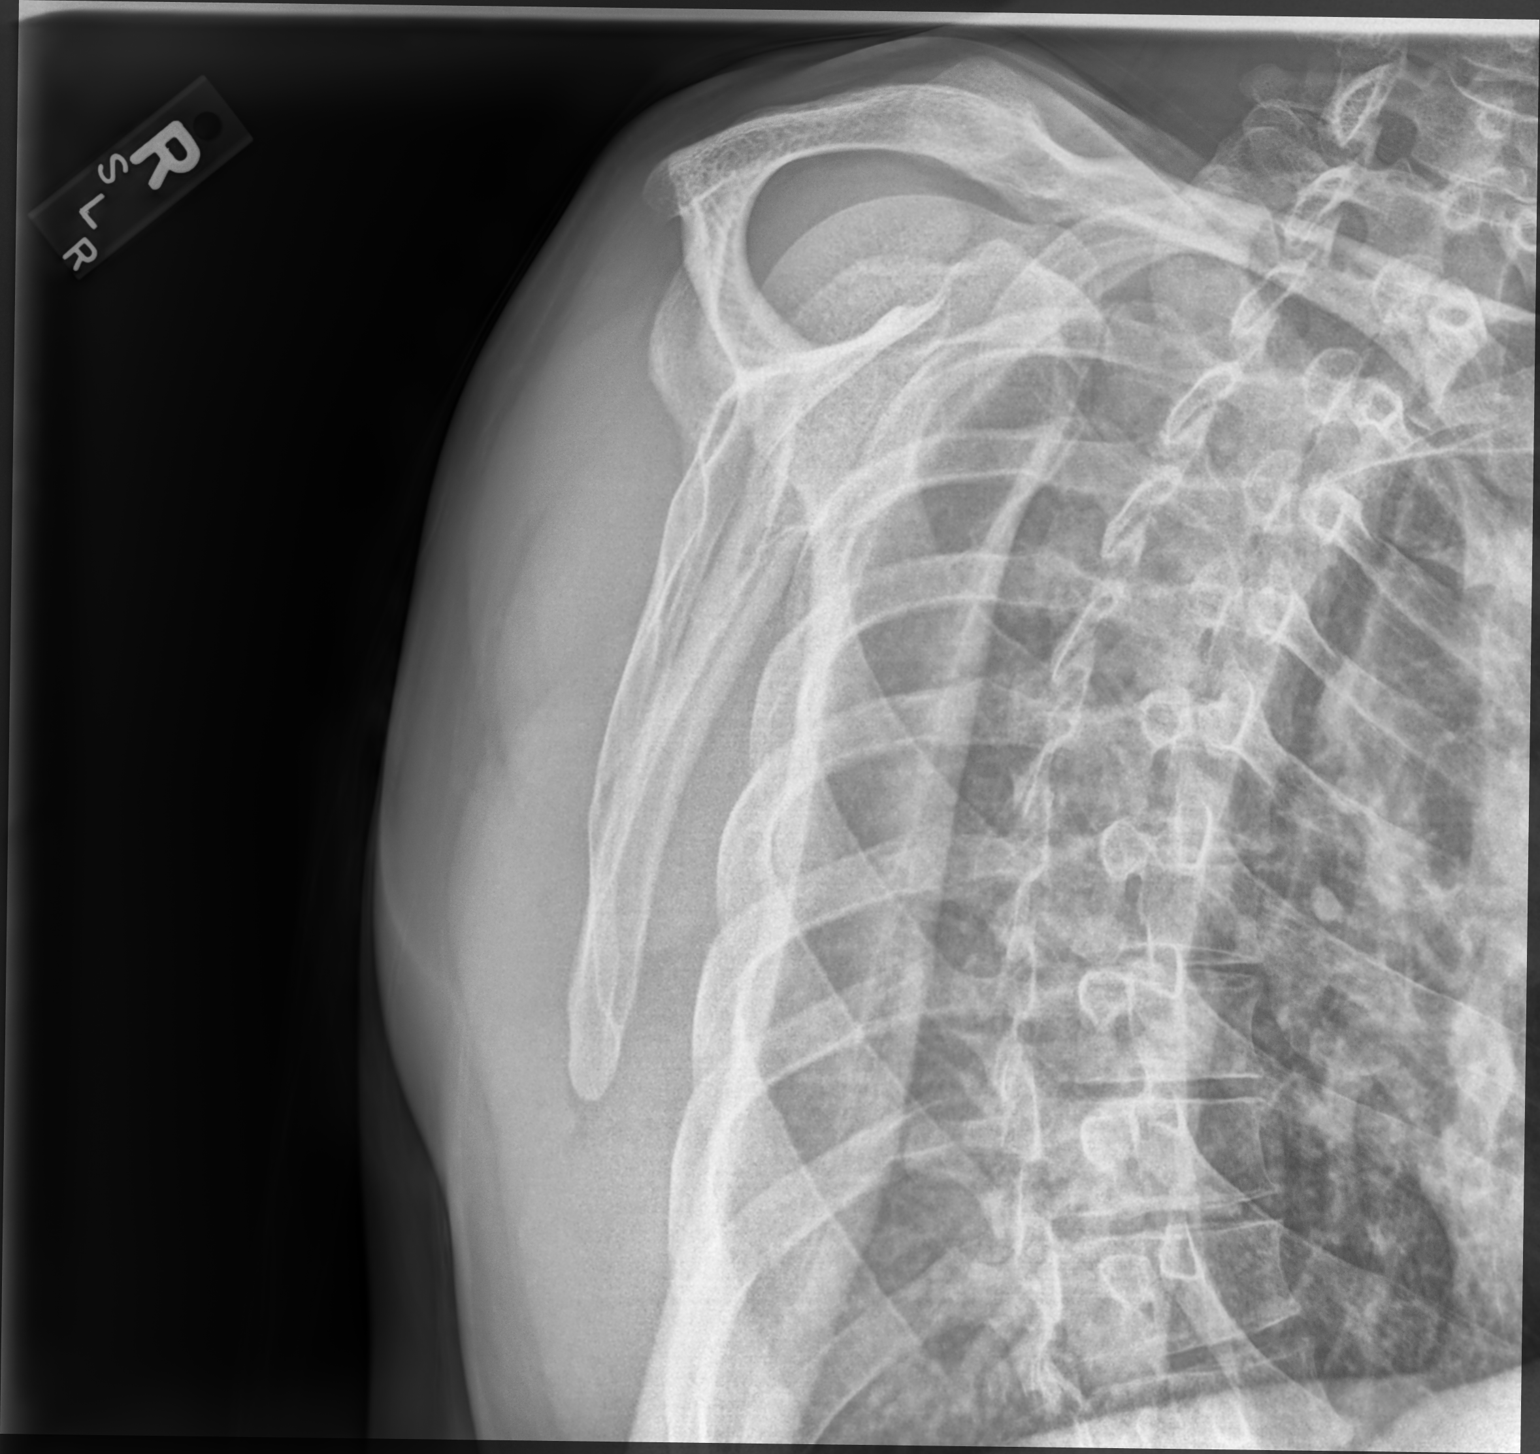

[3 of 3 positions shown; findings below may reference images not displayed]

FINDINGS: Glenohumeral degenerative change. No evidence of fracture,
dislocation, or separation. No acute abnormality identified.
IMPRESSION: Glenohumeral degenerative change.  No acute abnormality.

## 2022-10-17 ENCOUNTER — Ambulatory Visit (INDEPENDENT_AMBULATORY_CARE_PROVIDER_SITE_OTHER): Payer: Medicaid Other | Admitting: Internal Medicine

## 2022-10-17 ENCOUNTER — Encounter: Payer: Self-pay | Admitting: Internal Medicine

## 2022-10-17 VITALS — BP 132/88 | HR 89 | Ht 72.0 in | Wt 202.2 lb

## 2022-10-17 DIAGNOSIS — K2100 Gastro-esophageal reflux disease with esophagitis, without bleeding: Secondary | ICD-10-CM | POA: Diagnosis not present

## 2022-10-17 DIAGNOSIS — F17200 Nicotine dependence, unspecified, uncomplicated: Secondary | ICD-10-CM

## 2022-10-17 DIAGNOSIS — F418 Other specified anxiety disorders: Secondary | ICD-10-CM | POA: Diagnosis not present

## 2022-10-17 DIAGNOSIS — R739 Hyperglycemia, unspecified: Secondary | ICD-10-CM

## 2022-10-17 DIAGNOSIS — Z1159 Encounter for screening for other viral diseases: Secondary | ICD-10-CM | POA: Diagnosis not present

## 2022-10-17 DIAGNOSIS — D751 Secondary polycythemia: Secondary | ICD-10-CM | POA: Diagnosis not present

## 2022-10-17 DIAGNOSIS — Z2821 Immunization not carried out because of patient refusal: Secondary | ICD-10-CM | POA: Insufficient documentation

## 2022-10-17 DIAGNOSIS — E559 Vitamin D deficiency, unspecified: Secondary | ICD-10-CM | POA: Diagnosis not present

## 2022-10-17 DIAGNOSIS — Z0001 Encounter for general adult medical examination with abnormal findings: Secondary | ICD-10-CM

## 2022-10-17 DIAGNOSIS — Z1211 Encounter for screening for malignant neoplasm of colon: Secondary | ICD-10-CM

## 2022-10-17 MED ORDER — VENLAFAXINE HCL 37.5 MG PO TABS: 37.5000 mg | ORAL_TABLET | Freq: Two times a day (BID) | ORAL | 2 refills | Status: DC

## 2022-10-17 NOTE — Assessment & Plan Note (Signed)

## 2022-10-17 NOTE — Progress Notes (Signed)
Established Patient Office Visit  Subjective:  Patient ID: Christopher Ahr., male    DOB: 1976-03-11  Age: 47 y.o. MRN: [id number]  CC:  Chief Complaint  Patient presents with   Annual Exam    HPI Christopher Wyatt. is a 47 y.o. male with past medical history of MDD, GAD, tobacco abuse and alcohol abuse who presents for annual physical.  He reports spells of anxiety, anhedonia, tiredness and insomnia.  He has worsening of symptoms since separating from his girlfriend.  He was started on Effexor for MDD and GAD, but did not could not continue it.  He has tried multiple SSRIs in the past.  He currently denies any SI or HI.    Past Medical History:  Diagnosis Date   Anxiety    Chronic   GERD (gastroesophageal reflux disease)    Headache    Neuroma    OCD (obsessive compulsive disorder)     Past Surgical History:  Procedure Laterality Date   BIOPSY  06/11/2021   Procedure: BIOPSY;  Surgeon: Daneil Dolin, MD;  [facility];  Service: Endoscopy;;   CIRCUMCISION  2004   ESOPHAGOGASTRODUODENOSCOPY (EGD) WITH PROPOFOL N/A 08/10/2020   Procedure: ESOPHAGOGASTRODUODENOSCOPY (EGD) WITH PROPOFOL;  Surgeon: Daneil Dolin, MD;  [facility];  Service: Endoscopy;  Laterality: N/A;  1:45pm   ESOPHAGOGASTRODUODENOSCOPY (EGD) WITH PROPOFOL N/A 06/11/2021   Procedure: ESOPHAGOGASTRODUODENOSCOPY (EGD) WITH PROPOFOL;  Surgeon: Daneil Dolin, MD;  [facility];  Service: Endoscopy;  Laterality: N/A;  10:30am   EXCISION MORTON'S NEUROMA  10/16/2020   Procedure: EXCISION MORTON'S NEUROMA,AND REMOVAL OF INGROWN NAIL-GREAT TOES LEFT;  Surgeon: Felipa Furnace, DPM;  Location: [place];  Service: Podiatry;;   WISDOM TOOTH EXTRACTION  2011    Family History  Problem Relation Age of Onset   Depression Mother    Anxiety disorder Mother    Hypertension Father    Depression Sister    Throat cancer Maternal Grandmother    Alcohol abuse  Maternal Uncle    Bipolar disorder Paternal Uncle    Schizophrenia Paternal Uncle    Colon cancer Neg Hx     Social History   Socioeconomic History   Marital status: Significant Other    Spouse name: Not on file   Number of children: 1   Years of education: 12   Highest education level: High school graduate  Occupational History   Occupation: local truck driver  Tobacco Use   Smoking status: Every Day    Packs/day: 0.50    Years: 4.00    Total pack years: 2.00    Types: Cigarettes   Smokeless tobacco: Former    Types: Snuff  Vaping Use   Vaping Use: Never used  Substance and Sexual Activity   Alcohol use: Yes    Comment: 1-2 glasses of wine  few x week   Drug use: No   Sexual activity: Yes  Other Topics Concern   Not on file  Social History Narrative   Lives at home, along with his son and parents.   Left-sided.   1-3 cup coffee, occasional soda.   Social Determinants of Health   Financial Resource Strain: Not on file  Food Insecurity: Not on file  Transportation Needs: Not on file  Physical Activity: Not on file  Stress: Stress Concern Present (02/01/2022)   [place]    Feeling of Stress :  Rather much  Social Connections: Not on file  Intimate Partner Violence: Not on file    Outpatient Medications Prior to Visit  Medication Sig Dispense Refill   cyclobenzaprine (FLEXERIL) 5 MG tablet Take 1 tablet (5 mg total) by mouth 3 (three) times daily as needed for muscle spasms. 30 tablet 1   dexlansoprazole (DEXILANT) 60 MG capsule TAKE 1 CAPSULE(60 MG) BY MOUTH DAILY 90 capsule 3   hydrOXYzine (VISTARIL) 25 MG capsule Take 1 capsule (25 mg total) by mouth every 8 (eight) hours as needed for anxiety. 30 capsule 1   meloxicam (MOBIC) 15 MG tablet Take 15 mg by mouth daily as needed.     ondansetron (ZOFRAN) 4 MG tablet Take 1 tablet (4 mg total) by mouth every 8 (eight) hours as needed for nausea or  vomiting. 20 tablet 0   Chlorphen-PE-Acetaminophen (NOREL AD) 4-10-325 MG TABS Take 1 tablet by mouth 3 (three) times daily as needed. 30 tablet 0   methylPREDNISolone (MEDROL DOSEPAK) 4 MG TBPK tablet Take as the package instructed 1 each 0   promethazine-dextromethorphan (PROMETHAZINE-DM) 6.25-15 MG/5ML syrup Take 5 mLs by mouth 4 (four) times daily as needed for cough. 118 mL 0   venlafaxine (EFFEXOR) 37.5 MG tablet Take 1 tablet (37.5 mg total) by mouth 2 (two) times daily with a meal. 60 tablet 2   No facility-administered medications prior to visit.    Allergies  Allergen Reactions   Cat Hair Extract Itching   Paroxetine Hcl Other (See Comments)    Can not climax    ROS Review of Systems  Constitutional:  Negative for chills and fever.  Respiratory:  Negative for cough and shortness of breath.   Cardiovascular:  Negative for chest pain and palpitations.  Gastrointestinal:  Negative for diarrhea, nausea and vomiting.  Genitourinary:  Negative for dysuria and hematuria.  Musculoskeletal:  Positive for arthralgias (Left hip), back pain and neck pain. Negative for neck stiffness.  Skin:  Negative for rash.  Neurological:  Positive for weakness (LLE). Negative for dizziness.  Psychiatric/Behavioral:  Positive for dysphoric mood and sleep disturbance. Negative for agitation and behavioral problems. The patient is nervous/anxious.       Objective:    Physical Exam Vitals reviewed.  Constitutional:      General: He is not in acute distress.    Appearance: He is not diaphoretic.  HENT:     Head: Normocephalic and atraumatic.     Nose: Nose normal.     Mouth/Throat:     Mouth: Mucous membranes are moist.  Eyes:     General: No scleral icterus.    Extraocular Movements: Extraocular movements intact.  Cardiovascular:     Rate and Rhythm: Normal rate and regular rhythm.     Heart sounds: Normal heart sounds. No murmur heard. Pulmonary:     Breath sounds: Normal breath  sounds. No wheezing or rales.  Abdominal:     Palpations: Abdomen is soft.     Tenderness: There is no abdominal tenderness.  Musculoskeletal:     Cervical back: Neck supple. No tenderness.     Right lower leg: No edema.     Left lower leg: No edema.  Skin:    General: Skin is warm.     Findings: No rash.  Neurological:     General: No focal deficit present.     Mental Status: He is alert and oriented to person, place, and time.     Cranial Nerves: No cranial nerve deficit.  Sensory: No sensory deficit.     Motor: No weakness.  Psychiatric:        Mood and Affect: Mood normal.        Behavior: Behavior normal.     BP 132/88 (BP Location: Right Arm, Cuff Size: Normal)   Pulse 89   Ht 6' (1.829 m)   Wt 202 lb 3.2 oz (91.7 kg)   SpO2 95%   BMI 27.42 kg/m  Wt Readings from Last 3 Encounters:  10/17/22 202 lb 3.2 oz (91.7 kg)  01/09/22 193 lb (87.5 kg)  12/27/21 193 lb 3.2 oz (87.6 kg)    Lab Results  Component Value Date   TSH 1.640 04/12/2022   Lab Results  Component Value Date   WBC 8.8 05/30/2021   HGB 17.6 (H) 05/30/2021   HCT 52.5 (H) 05/30/2021   MCV 92.4 05/30/2021   PLT 214 05/30/2021   Lab Results  Component Value Date   NA 140 04/12/2022   K 4.3 04/12/2022   CO2 23 04/12/2022   GLUCOSE 87 04/12/2022   BUN 4 (L) 04/12/2022   CREATININE 0.81 04/12/2022   BILITOT 0.6 04/12/2022   ALKPHOS 85 04/12/2022   AST 39 04/12/2022   ALT 45 (H) 04/12/2022   PROT 7.0 04/12/2022   ALBUMIN 4.8 04/12/2022   CALCIUM 9.7 04/12/2022   ANIONGAP 8 05/30/2021   EGFR 111 04/12/2022   Lab Results  Component Value Date   CHOL 188 04/12/2022   Lab Results  Component Value Date   HDL 50 04/12/2022   Lab Results  Component Value Date   LDLCALC 118 (H) 04/12/2022   Lab Results  Component Value Date   TRIG 114 04/12/2022   Lab Results  Component Value Date   CHOLHDL 3.8 04/12/2022   Lab Results  Component Value Date   HGBA1C 5.2 12/03/2018       Assessment & Plan:   Problem List Items Addressed This Visit       Digestive   GERD (gastroesophageal reflux disease)    Well-controlled with Dexilant Discussed lifestyle modification (I.e., avoid spicy food, late meals before bedtime)      Relevant Orders   CBC with Differential/Platelet     Other   Tobacco use disorder    Smokes 0.5 pack/day  Asked about quitting: confirms that he currently smokes cigarettes Advise to quit smoking: Educated about QUITTING to reduce the risk of cancer, cardio and cerebrovascular disease. Assess willingness: Unwilling to quit at this time, but is working on cutting back. Assist with counseling and pharmacotherapy: Counseled for 5 minutes and literature provided. Has tried Chantix and Bupropion in the past, did not tolerate well. Prescribed nicotine patch in the previous visit, was not able to get it as it was not covered by insurance. Arrange for follow up: Follow up in 3 months and continue to offer help.      Mixed anxiety and depressive disorder    Has tried multiple SSRIs in the past, including Zoloft, Celexa and Wellbutrin -caused drowsiness and feeling spacey; Paxil caused anorgasmia Chart review suggests that he had good response with Effexor in the past, started Effexor for now, but did not continue, refills provided Had started Vistaril as needed for anxiety spells Avoid BZD's for now      Relevant Medications   venlafaxine (EFFEXOR) 37.5 MG tablet   Other Relevant Orders   TSH   CMP14+EGFR   CBC with Differential/Platelet   Vitamin D deficiency   Relevant Orders  VITAMIN D 25 Hydroxy (Vit-D Deficiency, Fractures)   Refused influenza vaccine   Encounter for general adult medical examination with abnormal findings - Primary    Physical exam as documented. Counseling done  re healthy lifestyle involving commitment to 150 minutes exercise per week, heart healthy diet, and attaining healthy weight.The importance of adequate sleep  also discussed. Changes in health habits are decided on by the patient with goals and time frames  set for achieving them. Immunization and cancer screening needs are specifically addressed at this visit.      Relevant Orders   TSH   Lipid panel   CMP14+EGFR   CBC with Differential/Platelet   Erythrocytosis    Likely due to active smoking Check CBC      Relevant Orders   ABO AND RH    Other Visit Diagnoses     Colon cancer screening       Relevant Orders   Ambulatory referral to Gastroenterology   Need for hepatitis C screening test       Relevant Orders   Hepatitis C Antibody   Hyperglycemia       Relevant Orders   Hemoglobin A1c       Meds ordered this encounter  Medications   venlafaxine (EFFEXOR) 37.5 MG tablet    Sig: Take 1 tablet (37.5 mg total) by mouth 2 (two) times daily with a meal.    Dispense:  60 tablet    Refill:  2    Follow-up: Return in about 3 months (around 01/15/2023) for MDD.    Lindell Spar, MD

## 2022-10-17 NOTE — Assessment & Plan Note (Signed)
Smokes 0.5 pack/day  Asked about quitting: confirms that he currently smokes cigarettes Advise to quit smoking: Educated about QUITTING to reduce the risk of cancer, cardio and cerebrovascular disease. Assess willingness: Unwilling to quit at this time, but is working on cutting back. Assist with counseling and pharmacotherapy: Counseled for 5 minutes and literature provided. Has tried Chantix and Bupropion in the past, did not tolerate well. Prescribed nicotine patch in the previous visit, was not able to get it as it was not covered by insurance. Arrange for follow up: Follow up in 3 months and continue to offer help.

## 2022-10-17 NOTE — Patient Instructions (Addendum)
Please start taking Effexor once daily for 1 week, and then twice daily.  Please try to cut down -> quit smoking.  Colonoscopy: Please contact [phone] and choose option 1, to get this appointment scheduled.

## 2022-10-17 NOTE — Assessment & Plan Note (Signed)
Likely due to active smoking Check CBC

## 2022-10-17 NOTE — Assessment & Plan Note (Signed)
Well-controlled with Dexilant Discussed lifestyle modification (I.e., avoid spicy food, late meals before bedtime)

## 2022-10-17 NOTE — Assessment & Plan Note (Signed)
Has tried multiple SSRIs in the past, including Zoloft, Celexa and Wellbutrin -caused drowsiness and feeling spacey; Paxil caused anorgasmia Chart review suggests that he had good response with Effexor in the past, started Effexor for now, but did not continue, refills provided Had started Vistaril as needed for anxiety spells Avoid BZD's for now

## 2022-10-28 ENCOUNTER — Ambulatory Visit (AMBULATORY_SURGERY_CENTER): Payer: Medicaid Other

## 2022-10-28 VITALS — Ht 72.0 in | Wt 190.0 lb

## 2022-10-28 DIAGNOSIS — Z83719 Family history of colon polyps, unspecified: Secondary | ICD-10-CM

## 2022-10-28 DIAGNOSIS — Z1211 Encounter for screening for malignant neoplasm of colon: Secondary | ICD-10-CM

## 2022-10-28 MED ORDER — PEG 3350-KCL-NA BICARB-NACL 420 G PO SOLR: 4000.0000 mL | Freq: Once | ORAL | 0 refills | Status: AC

## 2022-10-28 NOTE — Progress Notes (Signed)
No egg or soy allergy known to patient  No issues known to pt with past sedation with any surgeries or procedures Patient denies ever being told they had issues or difficulty with intubation  No FH of Malignant Hyperthermia Pt is not on diet pills Pt is not on  home 02  Pt is not on blood thinners  Pt denies issues with constipation  No A fib or A flutter Have any cardiac testing pending--NO Pt instructed to use Single[employer]com or GoodRx for a price reduction on prep  .Patient's chart reviewed by Osvaldo Angst CNRA prior to previsit and patient appropriate for the [place].  Previsit completed and red dot placed by patient's name on their procedure day (on provider's schedule).

## 2022-11-04 ENCOUNTER — Telehealth (INDEPENDENT_AMBULATORY_CARE_PROVIDER_SITE_OTHER): Payer: Medicaid Other | Admitting: Internal Medicine

## 2022-11-04 ENCOUNTER — Encounter: Payer: Self-pay | Admitting: Internal Medicine

## 2022-11-04 DIAGNOSIS — U071 COVID-19: Secondary | ICD-10-CM | POA: Diagnosis not present

## 2022-11-04 MED ORDER — NIRMATRELVIR/RITONAVIR (PAXLOVID)TABLET: 3.0000 | ORAL_TABLET | Freq: Two times a day (BID) | ORAL | 0 refills | Status: AC

## 2022-11-04 MED ORDER — PROMETHAZINE-DM 6.25-15 MG/5ML PO SYRP: 5.0000 mL | ORAL_SOLUTION | Freq: Four times a day (QID) | ORAL | 0 refills | Status: DC | PRN

## 2022-11-04 NOTE — Progress Notes (Signed)
Virtual Visit via Video Note   Because of Long A [name]'s co-morbid illnesses, he is at least at moderate risk for complications without adequate follow up.  This format is felt to be most appropriate for this patient at this time.  All issues noted in this document were discussed and addressed.  A limited physical exam was performed with this format.      Evaluation Performed:  Follow-up visit  Date:  11/04/2022   ID:  Christopher Wyatt., DOB 05-11-1976, MRN [id number]  Patient Location: Home Provider Location: Office/Clinic  Participants: Patient Location of Patient: Home/Car Location of Provider: Telehealth Consent was obtain for visit to be over via telehealth. I verified that I am speaking with the correct person using two identifiers.  PCP:  Lindell Spar, MD   Chief Complaint: Nasal congestion and cough   History of Present Illness:    Christopher Wyatt. is a 47 y.o. male who has a telehealth visit for complaint of nasal congestion, cough and fatigue for the last 2 days.  He also reports sinus pressure related headache and pain behind right eye.  His home COVID test was positive today.  He currently denies any dyspnea or wheezing.  The patient does have symptoms concerning for COVID-19 infection (fever, chills, cough, or new shortness of breath).   Past Medical, Surgical, Social History, Allergies, and Medications have been Reviewed.  Past Medical History:  Diagnosis Date   Anxiety    Chronic   GERD (gastroesophageal reflux disease)    Headache    Neuroma    OCD (obsessive compulsive disorder)    Past Surgical History:  Procedure Laterality Date   BIOPSY  06/11/2021   Procedure: BIOPSY;  Surgeon: Daneil Dolin, MD;  [facility];  Service: Endoscopy;;   CIRCUMCISION  2004   ESOPHAGOGASTRODUODENOSCOPY (EGD) WITH PROPOFOL N/A 08/10/2020   Procedure: ESOPHAGOGASTRODUODENOSCOPY (EGD) WITH PROPOFOL;  Surgeon: Daneil Dolin, MD;   [facility];  Service: Endoscopy;  Laterality: N/A;  1:45pm   ESOPHAGOGASTRODUODENOSCOPY (EGD) WITH PROPOFOL N/A 06/11/2021   Procedure: ESOPHAGOGASTRODUODENOSCOPY (EGD) WITH PROPOFOL;  Surgeon: Daneil Dolin, MD;  [facility];  Service: Endoscopy;  Laterality: N/A;  10:30am   EXCISION MORTON'S NEUROMA  10/16/2020   Procedure: EXCISION MORTON'S NEUROMA,AND REMOVAL OF INGROWN NAIL-GREAT TOES LEFT;  Surgeon: Felipa Furnace, DPM;  Location: [place];  Service: Podiatry;;   WISDOM TOOTH EXTRACTION  2011     Current Meds  Medication Sig   nirmatrelvir/ritonavir (PAXLOVID) 20 x 150 MG & 10 x '100MG'$  TABS Take 3 tablets by mouth 2 (two) times daily for 5 days. (Take nirmatrelvir 150 mg two tablets twice daily for 5 days and ritonavir 100 mg one tablet twice daily for 5 days) Patient GFR is > 60.   promethazine-dextromethorphan (PROMETHAZINE-DM) 6.25-15 MG/5ML syrup Take 5 mLs by mouth 4 (four) times daily as needed for cough.     Allergies:   Cat hair extract and Paroxetine hcl   ROS:   Please see the history of present illness.      All other systems reviewed and are negative.   Labs/Other Tests and Data Reviewed:    Recent Labs: 04/12/2022: ALT 45; BUN 4; Creatinine, Ser 0.81; Potassium 4.3; Sodium 140; TSH 1.640   Recent Lipid Panel Lab Results  Component Value Date/Time   CHOL 188 04/12/2022 09:50 AM   TRIG 114 04/12/2022 09:50 AM   HDL  50 04/12/2022 09:50 AM   CHOLHDL 3.8 04/12/2022 09:50 AM   CHOLHDL 2.2 03/26/2016 04:50 PM   LDLCALC 118 (H) 04/12/2022 09:50 AM    Wt Readings from Last 3 Encounters:  10/28/22 190 lb (86.2 kg)  10/17/22 202 lb 3.2 oz (91.7 kg)  01/09/22 193 lb (87.5 kg)     Objective:    Vital Signs:  There were no vitals taken for this visit.   VITAL SIGNS:  reviewed GEN:  no acute distress EYES:  sclerae anicteric, EOMI - Extraocular Movements Intact RESPIRATORY:  normal respiratory effort, symmetric  expansion NEURO:  alert and oriented x 3, no obvious focal deficit PSYCH:  normal affect  ASSESSMENT & PLAN:    COVID-19 infection Started Paxlovid Promethazine DM as needed for cough Advised to avoid smoking Nasal saline spray as needed for nasal congestion Self quarantine for at least 5 days from symptom onset or at least 24 hours afebrile period, which ever is later  I discussed the assessment and treatment plan with the patient. The patient was provided an opportunity to ask questions, and all were answered. The patient agreed with the plan and demonstrated an understanding of the instructions.   The patient was advised to call back or seek an in-person evaluation if the symptoms worsen or if the condition fails to improve as anticipated.  The above assessment and management plan was discussed with the patient. The patient verbalized understanding of and has agreed to the management plan.   Medication Adjustments/Labs and Tests Ordered: Current medicines are reviewed at length with the patient today.  Concerns regarding medicines are outlined above.   Tests Ordered: No orders of the defined types were placed in this encounter.   Medication Changes: Meds ordered this encounter  Medications   nirmatrelvir/ritonavir (PAXLOVID) 20 x 150 MG & 10 x '100MG'$  TABS    Sig: Take 3 tablets by mouth 2 (two) times daily for 5 days. (Take nirmatrelvir 150 mg two tablets twice daily for 5 days and ritonavir 100 mg one tablet twice daily for 5 days) Patient GFR is > 60.    Dispense:  30 tablet    Refill:  0   promethazine-dextromethorphan (PROMETHAZINE-DM) 6.25-15 MG/5ML syrup    Sig: Take 5 mLs by mouth 4 (four) times daily as needed for cough.    Dispense:  118 mL    Refill:  0     Note: This dictation was prepared with Dragon dictation along with smaller phrase technology. Similar sounding words can be transcribed inadequately or may not be corrected upon review. Any transcriptional  errors that result from this process are unintentional.      Disposition:  Follow up  Signed, Lindell Spar, MD  11/04/2022 5:07 PM     [place]

## 2022-11-04 NOTE — Patient Instructions (Signed)
Please start taking Paxlovid as prescribed.

## 2022-11-07 ENCOUNTER — Telehealth: Payer: Self-pay | Admitting: Internal Medicine

## 2022-11-07 ENCOUNTER — Encounter: Payer: Self-pay | Admitting: Radiology

## 2022-11-07 NOTE — Telephone Encounter (Signed)
Tried calling pt and no answer.

## 2022-11-07 NOTE — Telephone Encounter (Signed)
Pt wants nurse to give him a call in regards to pwk

## 2022-11-11 ENCOUNTER — Encounter: Payer: Medicaid Other | Admitting: Gastroenterology

## 2022-11-18 ENCOUNTER — Encounter: Payer: Self-pay | Admitting: Internal Medicine

## 2022-11-18 ENCOUNTER — Ambulatory Visit: Payer: Medicaid Other | Admitting: Internal Medicine

## 2022-11-18 VITALS — BP 132/88 | HR 110 | Ht 72.0 in | Wt 202.6 lb

## 2022-11-18 DIAGNOSIS — Z01818 Encounter for other preprocedural examination: Secondary | ICD-10-CM

## 2022-11-18 DIAGNOSIS — F418 Other specified anxiety disorders: Secondary | ICD-10-CM

## 2022-11-18 DIAGNOSIS — Z1211 Encounter for screening for malignant neoplasm of colon: Secondary | ICD-10-CM

## 2022-11-18 NOTE — Assessment & Plan Note (Addendum)
Medically optimized for functional capacity evaluation BP wnl EKG (09/22): Normal sinus rhythm. No signs of active ischemia. Denies chest pain, dyspnea upon minimal exertion or palpitations currently Needs to cut down -> quit smoking

## 2022-11-18 NOTE — Assessment & Plan Note (Signed)
Has tried multiple SSRIs in the past, including Zoloft, Celexa and Wellbutrin -caused drowsiness and feeling spacey; Paxil caused anorgasmia Chart review suggests that he had good response with Effexor in the past, started Effexor for now, needs to take it regularly, refills provided Had started Vistaril as needed for anxiety spells Avoid BZD's for now

## 2022-11-18 NOTE — Progress Notes (Signed)
Established Patient Office Visit  Subjective:  Patient ID: Christopher Ahr., male    DOB: 19-Oct-1975  Age: 47 y.o. MRN: [id number]  CC:  Chief Complaint  Patient presents with   Medical Clearance    Patient is needing medical clearance for his job.    HPI Kelcey Arena. is a 47 y.o. male with past medical history of MDD, GAD, tobacco abuse and alcohol abuse who presents for f/u of her chronic medical conditions.  He presents for preprocedure evaluation.  He needs from a functional capacity evaluation by [facility] to return to work.  He was advised to get medical evaluation as he mentioned in the questionnaire that he has dyspnea and has had abnormal EKG (by mistake).  Of note, he reports today that he has dyspnea upon heavy exertion, but denies any dyspnea at rest or with minimal exertion.  Denies any chest pain, palpitations or leg swelling currently.  Denies any orthopnea or PND.  His last EKG in 09/22 was normal.  He is currently taking Effexor for GAD and MDD, but inconsistently.  He has had anxiety or panic spells in the past.  He also has Vistaril as needed for anxiety.  Past Medical History:  Diagnosis Date   Anxiety    Chronic   GERD (gastroesophageal reflux disease)    Headache    Neuroma    OCD (obsessive compulsive disorder)     Past Surgical History:  Procedure Laterality Date   BIOPSY  06/11/2021   Procedure: BIOPSY;  Surgeon: Daneil Dolin, MD;  [facility];  Service: Endoscopy;;   CIRCUMCISION  2004   ESOPHAGOGASTRODUODENOSCOPY (EGD) WITH PROPOFOL N/A 08/10/2020   Procedure: ESOPHAGOGASTRODUODENOSCOPY (EGD) WITH PROPOFOL;  Surgeon: Daneil Dolin, MD;  [facility];  Service: Endoscopy;  Laterality: N/A;  1:45pm   ESOPHAGOGASTRODUODENOSCOPY (EGD) WITH PROPOFOL N/A 06/11/2021   Procedure: ESOPHAGOGASTRODUODENOSCOPY (EGD) WITH PROPOFOL;  Surgeon: Daneil Dolin, MD;  [facility];  Service: Endoscopy;  Laterality:  N/A;  10:30am   EXCISION MORTON'S NEUROMA  10/16/2020   Procedure: EXCISION MORTON'S NEUROMA,AND REMOVAL OF INGROWN NAIL-GREAT TOES LEFT;  Surgeon: Felipa Furnace, DPM;  Location: [place];  Service: Podiatry;;   WISDOM TOOTH EXTRACTION  2011    Family History  Problem Relation Age of Onset   Depression Mother    Anxiety disorder Mother    Colon polyps Father    Colon cancer Father    Hypertension Father    Depression Sister    Alcohol abuse Maternal Uncle    Bipolar disorder Paternal Uncle    Schizophrenia Paternal Uncle    Esophageal cancer Maternal Grandmother    Throat cancer Maternal Grandmother    Rectal cancer Neg Hx    Stomach cancer Neg Hx     Social History   Socioeconomic History   Marital status: Significant Other    Spouse name: Not on file   Number of children: 1   Years of education: 12   Highest education level: High school graduate  Occupational History   Occupation: local truck driver  Tobacco Use   Smoking status: Every Day    Packs/day: 0.50    Years: 4.00    Total pack years: 2.00    Types: Cigarettes   Smokeless tobacco: Former    Types: Snuff  Vaping Use   Vaping Use: Never used  Substance and Sexual Activity   Alcohol use: Yes    Comment:  1-2 glasses of wine  few x week   Drug use: No   Sexual activity: Yes  Other Topics Concern   Not on file  Social History Narrative   Lives at home, along with his son and parents.   Left-sided.   1-3 cup coffee, occasional soda.   Social Determinants of Health   Financial Resource Strain: Not on file  Food Insecurity: Not on file  Transportation Needs: Not on file  Physical Activity: Not on file  Stress: Stress Concern Present (02/01/2022)   [place]    Feeling of Stress : Rather much  Social Connections: Not on file  Intimate Partner Violence: Not on file    Outpatient Medications Prior to Visit  Medication  Sig Dispense Refill   cyclobenzaprine (FLEXERIL) 10 MG tablet Take 10 mg by mouth 2 (two) times daily.     dexlansoprazole (DEXILANT) 60 MG capsule TAKE 1 CAPSULE(60 MG) BY MOUTH DAILY 90 capsule 3   diclofenac (VOLTAREN) 75 MG EC tablet Take 75 mg by mouth 2 (two) times daily.     gabapentin (NEURONTIN) 300 MG capsule TAKE 1 CAPSULE BY MOUTH IN THE MORNING AND AT NIGHT     hydrOXYzine (VISTARIL) 25 MG capsule Take 1 capsule (25 mg total) by mouth every 8 (eight) hours as needed for anxiety. (Patient not taking: Reported on 10/28/2022) 30 capsule 1   meloxicam (MOBIC) 15 MG tablet Take 15 mg by mouth daily as needed. (Patient not taking: Reported on 10/28/2022)     ondansetron (ZOFRAN) 4 MG tablet Take 1 tablet (4 mg total) by mouth every 8 (eight) hours as needed for nausea or vomiting. (Patient not taking: Reported on 10/28/2022) 20 tablet 0   promethazine-dextromethorphan (PROMETHAZINE-DM) 6.25-15 MG/5ML syrup Take 5 mLs by mouth 4 (four) times daily as needed for cough. 118 mL 0   venlafaxine (EFFEXOR) 37.5 MG tablet Take 1 tablet (37.5 mg total) by mouth 2 (two) times daily with a meal. (Patient not taking: Reported on 10/28/2022) 60 tablet 2   No facility-administered medications prior to visit.    Allergies  Allergen Reactions   Cat Hair Extract Itching   Paroxetine Hcl Other (See Comments)    Can not climax    ROS Review of Systems  Constitutional:  Negative for chills and fever.  Respiratory:  Negative for cough and shortness of breath.   Cardiovascular:  Negative for chest pain and palpitations.  Gastrointestinal:  Negative for diarrhea, nausea and vomiting.  Genitourinary:  Negative for dysuria and hematuria.  Musculoskeletal:  Positive for arthralgias (Left hip), back pain and neck pain. Negative for neck stiffness.  Skin:  Negative for rash.  Neurological:  Positive for weakness (LLE). Negative for dizziness.  Psychiatric/Behavioral:  Positive for dysphoric mood and sleep  disturbance. Negative for agitation and behavioral problems. The patient is nervous/anxious.       Objective:    Physical Exam Vitals reviewed.  Constitutional:      General: He is not in acute distress.    Appearance: He is not diaphoretic.  HENT:     Head: Normocephalic and atraumatic.     Nose: Nose normal.     Mouth/Throat:     Mouth: Mucous membranes are moist.  Eyes:     General: No scleral icterus.    Extraocular Movements: Extraocular movements intact.  Cardiovascular:     Rate and Rhythm: Normal rate and regular rhythm.     Heart sounds: Normal  heart sounds. No murmur heard. Pulmonary:     Breath sounds: Normal breath sounds. No wheezing or rales.  Musculoskeletal:     Cervical back: Neck supple. No tenderness.     Right lower leg: No edema.     Left lower leg: No edema.  Skin:    General: Skin is warm.     Findings: No rash.  Neurological:     General: No focal deficit present.     Mental Status: He is alert and oriented to person, place, and time.     Sensory: No sensory deficit.     Motor: No weakness.  Psychiatric:        Mood and Affect: Mood normal.        Behavior: Behavior normal.     BP 132/88 (BP Location: Left Arm, Cuff Size: Normal)   Pulse (!) 110   Ht 6' (1.829 m)   Wt 202 lb 9.6 oz (91.9 kg)   SpO2 96%   BMI 27.48 kg/m  Wt Readings from Last 3 Encounters:  11/18/22 202 lb 9.6 oz (91.9 kg)  10/28/22 190 lb (86.2 kg)  10/17/22 202 lb 3.2 oz (91.7 kg)    Lab Results  Component Value Date   TSH 1.640 04/12/2022   Lab Results  Component Value Date   WBC 8.8 05/30/2021   HGB 17.6 (H) 05/30/2021   HCT 52.5 (H) 05/30/2021   MCV 92.4 05/30/2021   PLT 214 05/30/2021   Lab Results  Component Value Date   NA 140 04/12/2022   K 4.3 04/12/2022   CO2 23 04/12/2022   GLUCOSE 87 04/12/2022   BUN 4 (L) 04/12/2022   CREATININE 0.81 04/12/2022   BILITOT 0.6 04/12/2022   ALKPHOS 85 04/12/2022   AST 39 04/12/2022   ALT 45 (H) 04/12/2022    PROT 7.0 04/12/2022   ALBUMIN 4.8 04/12/2022   CALCIUM 9.7 04/12/2022   ANIONGAP 8 05/30/2021   EGFR 111 04/12/2022   Lab Results  Component Value Date   CHOL 188 04/12/2022   Lab Results  Component Value Date   HDL 50 04/12/2022   Lab Results  Component Value Date   LDLCALC 118 (H) 04/12/2022   Lab Results  Component Value Date   TRIG 114 04/12/2022   Lab Results  Component Value Date   CHOLHDL 3.8 04/12/2022   Lab Results  Component Value Date   HGBA1C 5.2 12/03/2018      Assessment & Plan:   Problem List Items Addressed This Visit       Other   Mixed anxiety and depressive disorder    Has tried multiple SSRIs in the past, including Zoloft, Celexa and Wellbutrin -caused drowsiness and feeling spacey; Paxil caused anorgasmia Chart review suggests that he had good response with Effexor in the past, started Effexor for now, needs to take it regularly, refills provided Had started Vistaril as needed for anxiety spells Avoid BZD's for now      Pre-procedural general physical examination - Primary    Medically optimized for functional capacity evaluation BP wnl EKG (09/22): Normal sinus rhythm. No signs of active ischemia. Denies chest pain, dyspnea upon minimal exertion or palpitations currently Needs to cut down -> quit smoking      Other Visit Diagnoses     Colon cancer screening       Relevant Orders   Cologuard       No orders of the defined types were placed in this encounter.   Follow-up:  Return if symptoms worsen or fail to improve.    Lindell Spar, MD

## 2022-11-18 NOTE — Patient Instructions (Signed)
Please follow DASH diet and perform moderate exercise/walking at least 150 mins/week.  Please try to cut down -> quit smoking.

## 2022-11-21 DIAGNOSIS — Z1211 Encounter for screening for malignant neoplasm of colon: Secondary | ICD-10-CM | POA: Diagnosis not present

## 2022-11-27 LAB — COLOGUARD: COLOGUARD: NEGATIVE

## 2023-01-08 ENCOUNTER — Ambulatory Visit: Payer: Medicaid Other | Admitting: Internal Medicine

## 2023-01-08 ENCOUNTER — Encounter: Payer: Self-pay | Admitting: Internal Medicine

## 2023-01-08 VITALS — BP 128/87 | HR 63 | Ht 72.0 in | Wt 201.0 lb

## 2023-01-08 DIAGNOSIS — M5442 Lumbago with sciatica, left side: Secondary | ICD-10-CM | POA: Diagnosis not present

## 2023-01-08 DIAGNOSIS — F411 Generalized anxiety disorder: Secondary | ICD-10-CM | POA: Diagnosis not present

## 2023-01-08 DIAGNOSIS — F422 Mixed obsessional thoughts and acts: Secondary | ICD-10-CM

## 2023-01-08 DIAGNOSIS — K2100 Gastro-esophageal reflux disease with esophagitis, without bleeding: Secondary | ICD-10-CM | POA: Diagnosis not present

## 2023-01-08 DIAGNOSIS — G8929 Other chronic pain: Secondary | ICD-10-CM

## 2023-01-08 MED ORDER — FLUOXETINE HCL 10 MG PO TABS: 10.0000 mg | ORAL_TABLET | Freq: Every day | ORAL | 0 refills | Status: DC

## 2023-01-08 NOTE — Patient Instructions (Signed)
Please start taking Prozac as prescribed.  Please continue to cut down -> quit smoking.

## 2023-01-08 NOTE — Assessment & Plan Note (Signed)
Well-controlled with Dexilant Discussed lifestyle modification (I.e., avoid spicy food, late meals before bedtime) 

## 2023-01-08 NOTE — Progress Notes (Signed)
Established Patient Office Visit  Subjective:  Patient ID: Christopher Martin., male    DOB: 08/30/1976  Age: 47 y.o. MRN: [id number]  CC:  Chief Complaint  Patient presents with   Medical Clearance    Pt is here to discuss medical clearance, pt reports feeling well.     HPI Christopher Wyatt. is a 47 y.o. male with past medical history of MDD, GAD, tobacco abuse and alcohol abuse who presents for f/u of his chronic medical conditions.  He had a functional evaluation by [facility]. he was able to participate in exertional activity without chest pain or dyspnea. BP is well-controlled. Patient denies headache, dizziness, chest pain, dyspnea or palpitations. He used to drive a truck.  He was placed on Effexor for GAD, but has stop taking it now. He has had anxiety or panic spells in the past.  He has tried Zoloft, Paxil, Celexa and Xanax in the past. He also had BuSpar and Vistaril as needed for anxiety.  Chart review also suggest history of OCD, used to follow up with psychiatry.  Denies any SI or HI currently.     Past Medical History:  Diagnosis Date   Anxiety    Chronic   GERD (gastroesophageal reflux disease)    Headache    Neuroma    OCD (obsessive compulsive disorder)     Past Surgical History:  Procedure Laterality Date   BIOPSY  06/11/2021   Procedure: BIOPSY;  Surgeon: Corbin Ade, MD;  [facility];  Service: Endoscopy;;   CIRCUMCISION  2004   ESOPHAGOGASTRODUODENOSCOPY (EGD) WITH PROPOFOL N/A 08/10/2020   Procedure: ESOPHAGOGASTRODUODENOSCOPY (EGD) WITH PROPOFOL;  Surgeon: Corbin Ade, MD;  [facility];  Service: Endoscopy;  Laterality: N/A;  1:45pm   ESOPHAGOGASTRODUODENOSCOPY (EGD) WITH PROPOFOL N/A 06/11/2021   Procedure: ESOPHAGOGASTRODUODENOSCOPY (EGD) WITH PROPOFOL;  Surgeon: Corbin Ade, MD;  [facility];  Service: Endoscopy;  Laterality: N/A;  10:30am   EXCISION MORTON'S NEUROMA  10/16/2020   Procedure:  EXCISION MORTON'S NEUROMA,AND REMOVAL OF INGROWN NAIL-GREAT TOES LEFT;  Surgeon: Candelaria Stagers, DPM;  Location: [facility];  Service: Podiatry;;   WISDOM TOOTH EXTRACTION  2011    Family History  Problem Relation Age of Onset   Depression Mother    Anxiety disorder Mother    Colon polyps Father    Colon cancer Father    Hypertension Father    Depression Sister    Alcohol abuse Maternal Uncle    Bipolar disorder Paternal Uncle    Schizophrenia Paternal Uncle    Esophageal cancer Maternal Grandmother    Throat cancer Maternal Grandmother    Rectal cancer Neg Hx    Stomach cancer Neg Hx     Social History   Socioeconomic History   Marital status: Significant Other    Spouse name: Not on file   Number of children: 1   Years of education: 12   Highest education level: High school graduate  Occupational History   Occupation: local truck driver  Tobacco Use   Smoking status: Every Day    Packs/day: 0.50    Years: 4.00    Additional pack years: 0.00    Total pack years: 2.00    Types: Cigarettes   Smokeless tobacco: Former    Types: Snuff  Vaping Use   Vaping Use: Never used  Substance and Sexual Activity   Alcohol use: Yes    Comment: 1-2 glasses of  wine  few x week   Drug use: No   Sexual activity: Yes  Other Topics Concern   Not on file  Social History Narrative   Lives at home, along with his son and parents.   Left-sided.   1-3 cup coffee, occasional soda.   Social Determinants of Health   Financial Resource Strain: Not on file  Food Insecurity: Not on file  Transportation Needs: Not on file  Physical Activity: Not on file  Stress: Stress Concern Present (02/01/2022)   [employer] of Occupational Health - Occupational Stress Questionnaire    Feeling of Stress : Rather much  Social Connections: Not on file  Intimate Partner Violence: Not on file    Outpatient Medications Prior to Visit  Medication Sig Dispense Refill    dexlansoprazole (DEXILANT) 60 MG capsule TAKE 1 CAPSULE(60 MG) BY MOUTH DAILY 90 capsule 3   diclofenac (VOLTAREN) 75 MG EC tablet Take 75 mg by mouth 2 (two) times daily.     cyclobenzaprine (FLEXERIL) 10 MG tablet Take 10 mg by mouth 2 (two) times daily.     gabapentin (NEURONTIN) 300 MG capsule TAKE 1 CAPSULE BY MOUTH IN THE MORNING AND AT NIGHT     hydrOXYzine (VISTARIL) 25 MG capsule Take 1 capsule (25 mg total) by mouth every 8 (eight) hours as needed for anxiety. 30 capsule 1   meloxicam (MOBIC) 15 MG tablet Take 15 mg by mouth daily as needed.     ondansetron (ZOFRAN) 4 MG tablet Take 1 tablet (4 mg total) by mouth every 8 (eight) hours as needed for nausea or vomiting. 20 tablet 0   promethazine-dextromethorphan (PROMETHAZINE-DM) 6.25-15 MG/5ML syrup Take 5 mLs by mouth 4 (four) times daily as needed for cough. 118 mL 0   venlafaxine (EFFEXOR) 37.5 MG tablet Take 1 tablet (37.5 mg total) by mouth 2 (two) times daily with a meal. 60 tablet 2   No facility-administered medications prior to visit.    Allergies  Allergen Reactions   Cat Hair Extract Itching   Paroxetine Hcl Other (See Comments)    Can not climax    ROS Review of Systems  Constitutional:  Negative for chills and fever.  Respiratory:  Negative for cough and shortness of breath.   Cardiovascular:  Negative for chest pain and palpitations.  Gastrointestinal:  Negative for diarrhea, nausea and vomiting.  Genitourinary:  Negative for dysuria and hematuria.  Musculoskeletal:  Positive for arthralgias (Left hip) and back pain. Negative for neck pain and neck stiffness.  Skin:  Negative for rash.  Neurological:  Negative for dizziness and weakness.  Psychiatric/Behavioral:  Positive for sleep disturbance. Negative for agitation and behavioral problems. The patient is nervous/anxious.       Objective:    Physical Exam Vitals reviewed.  Constitutional:      General: He is not in acute distress.    Appearance: He is  not diaphoretic.  HENT:     Head: Normocephalic and atraumatic.     Nose: Nose normal.     Mouth/Throat:     Mouth: Mucous membranes are moist.  Eyes:     General: No scleral icterus.    Extraocular Movements: Extraocular movements intact.  Cardiovascular:     Rate and Rhythm: Normal rate and regular rhythm.     Heart sounds: Normal heart sounds. No murmur heard. Pulmonary:     Breath sounds: Normal breath sounds. No wheezing or rales.  Musculoskeletal:     Cervical back: Neck supple. No tenderness.  Right lower leg: No edema.     Left lower leg: No edema.  Skin:    General: Skin is warm.     Findings: No rash.  Neurological:     General: No focal deficit present.     Mental Status: He is alert and oriented to person, place, and time.     Sensory: No sensory deficit.     Motor: No weakness.  Psychiatric:        Mood and Affect: Mood normal.        Behavior: Behavior normal.     BP 128/87   Pulse 63   Ht 6' (1.829 m)   Wt 201 lb (91.2 kg)   SpO2 91%   BMI 27.26 kg/m  Wt Readings from Last 3 Encounters:  01/08/23 201 lb (91.2 kg)  11/18/22 202 lb 9.6 oz (91.9 kg)  10/28/22 190 lb (86.2 kg)    Lab Results  Component Value Date   TSH 1.640 04/12/2022   Lab Results  Component Value Date   WBC 8.8 05/30/2021   HGB 17.6 (H) 05/30/2021   HCT 52.5 (H) 05/30/2021   MCV 92.4 05/30/2021   PLT 214 05/30/2021   Lab Results  Component Value Date   NA 140 04/12/2022   K 4.3 04/12/2022   CO2 23 04/12/2022   GLUCOSE 87 04/12/2022   BUN 4 (L) 04/12/2022   CREATININE 0.81 04/12/2022   BILITOT 0.6 04/12/2022   ALKPHOS 85 04/12/2022   AST 39 04/12/2022   ALT 45 (H) 04/12/2022   PROT 7.0 04/12/2022   ALBUMIN 4.8 04/12/2022   CALCIUM 9.7 04/12/2022   ANIONGAP 8 05/30/2021   EGFR 111 04/12/2022   Lab Results  Component Value Date   CHOL 188 04/12/2022   Lab Results  Component Value Date   HDL 50 04/12/2022   Lab Results  Component Value Date   LDLCALC  118 (H) 04/12/2022   Lab Results  Component Value Date   TRIG 114 04/12/2022   Lab Results  Component Value Date   CHOLHDL 3.8 04/12/2022   Lab Results  Component Value Date   HGBA1C 5.2 12/03/2018      Assessment & Plan:   Problem List Items Addressed This Visit       Digestive   GERD (gastroesophageal reflux disease)    Well-controlled with Dexilant Discussed lifestyle modification (I.e., avoid spicy food, late meals before bedtime)        Nervous and Auditory   Chronic left-sided low back pain with left-sided sciatica    Has chronic low back pain with radicular symptoms to LLE -improved now Has seen orthopedic surgery Advised to take Tylenol or Voltaren as needed for mild-to-moderate pain Return to work note provided      Relevant Medications   FLUoxetine (PROZAC) 10 MG tablet     Other   OCD (obsessive compulsive disorder)    Has h/o OCD Started Prozac      GAD (generalized anxiety disorder) - Primary    Has tried multiple SSRIs in the past, including Zoloft, Celexa and Wellbutrin -caused drowsiness and feeling spacey; Paxil caused anorgasmia Chart review suggests that he had good response with Prozac in the past, started Prozac for now, needs to take it regularly DC Effexor as he did not continue treatment Had started Vistaril as needed for anxiety spells Avoid BZDs for now      Relevant Medications   FLUoxetine (PROZAC) 10 MG tablet    Meds ordered this encounter  Medications   FLUoxetine (PROZAC) 10 MG tablet    Sig: Take 1 tablet (10 mg total) by mouth daily.    Dispense:  90 tablet    Refill:  0    Follow-up: Return in about 3 months (around 04/10/2023) for GAD.    Anabel Halon, MD

## 2023-01-08 NOTE — Assessment & Plan Note (Signed)
Has tried multiple SSRIs in the past, including Zoloft, Celexa and Wellbutrin -caused drowsiness and feeling spacey; Paxil caused anorgasmia Chart review suggests that he had good response with Prozac in the past, started Prozac for now, needs to take it regularly DC Effexor as he did not continue treatment Had started Vistaril as needed for anxiety spells Avoid BZDs for now

## 2023-01-08 NOTE — Assessment & Plan Note (Signed)
Has chronic low back pain with radicular symptoms to LLE -improved now Has seen orthopedic surgery Advised to take Tylenol or Voltaren as needed for mild-to-moderate pain Return to work note provided

## 2023-01-08 NOTE — Assessment & Plan Note (Signed)
Has h/o OCD Started Prozac

## 2023-01-15 ENCOUNTER — Ambulatory Visit: Payer: Medicaid Other | Admitting: Internal Medicine

## 2023-01-15 ENCOUNTER — Encounter: Payer: Self-pay | Admitting: Internal Medicine

## 2023-01-15 VITALS — BP 120/89 | HR 125 | Ht 72.0 in | Wt 201.8 lb

## 2023-01-15 DIAGNOSIS — F411 Generalized anxiety disorder: Secondary | ICD-10-CM

## 2023-01-15 DIAGNOSIS — F40248 Other situational type phobia: Secondary | ICD-10-CM

## 2023-01-15 MED ORDER — FLUOXETINE HCL 10 MG PO CAPS
10.0000 mg | ORAL_CAPSULE | Freq: Every day | ORAL | 0 refills | Status: DC
Start: 2023-01-15 — End: 2023-03-26

## 2023-01-15 NOTE — Patient Instructions (Signed)
Please start taking Prozac as prescribed. ?

## 2023-01-15 NOTE — Assessment & Plan Note (Signed)
Has tried multiple SSRIs in the past, including Zoloft, Celexa and Wellbutrin -caused drowsiness and feeling spacey; Paxil caused anorgasmia Chart review suggests that he had good response with Prozac in the past, started Prozac for now, needs to take it regularly DC Effexor as he did not continue treatment Had started Vistaril as needed for anxiety spells Avoid BZDs for now 

## 2023-01-15 NOTE — Assessment & Plan Note (Addendum)
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
## 2023-01-16 ENCOUNTER — Ambulatory Visit: Payer: Medicaid Other | Admitting: Internal Medicine

## 2023-03-20 ENCOUNTER — Ambulatory Visit: Payer: Medicaid Other | Admitting: Internal Medicine

## 2023-03-26 ENCOUNTER — Telehealth (INDEPENDENT_AMBULATORY_CARE_PROVIDER_SITE_OTHER): Payer: Medicaid Other | Admitting: Internal Medicine

## 2023-03-26 ENCOUNTER — Encounter: Payer: Self-pay | Admitting: Internal Medicine

## 2023-03-26 DIAGNOSIS — F422 Mixed obsessional thoughts and acts: Secondary | ICD-10-CM | POA: Diagnosis not present

## 2023-03-26 DIAGNOSIS — F411 Generalized anxiety disorder: Secondary | ICD-10-CM | POA: Diagnosis not present

## 2023-03-26 DIAGNOSIS — J0100 Acute maxillary sinusitis, unspecified: Secondary | ICD-10-CM | POA: Insufficient documentation

## 2023-03-26 MED ORDER — AMOXICILLIN-POT CLAVULANATE 875-125 MG PO TABS
1.0000 | ORAL_TABLET | Freq: Two times a day (BID) | ORAL | 0 refills | Status: DC
Start: 2023-03-26 — End: 2024-01-02

## 2023-03-26 MED ORDER — BUSPIRONE HCL 7.5 MG PO TABS
7.5000 mg | ORAL_TABLET | Freq: Two times a day (BID) | ORAL | 1 refills | Status: DC
Start: 2023-03-26 — End: 2023-05-29

## 2023-03-26 NOTE — Patient Instructions (Addendum)
Please start taking buspirone  as prescribed.  Please stop taking Prozac.  Please use nasal saline spray as needed for nasal congestion.  Please start taking Augmentin for acute sinusitis if your symptoms do not improve in the next 2 days.

## 2023-03-26 NOTE — Assessment & Plan Note (Signed)
Has h/o OCD Did not continue Prozac due to insomnia Has tried multiple other SSRIs, including Zoloft, Celexa, Paxil -had side effects Used to follow up with psychiatry, but prefers to avoid seeing psychiatry now

## 2023-03-26 NOTE — Progress Notes (Signed)
Virtual Visit via Video Note   Because of [name]'s co-morbid illnesses, he is at least at moderate risk for complications without adequate follow up.  This format is felt to be most appropriate for this patient at this time.  All issues noted in this document were discussed and addressed.  A limited physical exam was performed with this format.      Evaluation Performed:  Follow-up visit  Date:  03/26/2023   ID:  Christopher Wyatt., DOB 05-27-1976, MRN [id number]  Patient Location: Home Provider Location: Office/Clinic  Participants: Patient Location of Patient: Home Location of Provider: Telehealth Consent was obtain for visit to be over via telehealth. I verified that I am speaking with the correct person using two identifiers.  PCP:  Anabel Halon, MD   Chief Complaint: F/u of GAD  History of Present Illness:    Christopher Wyatt. is a 47 y.o. male with past medical history of MDD, GAD, tobacco abuse and alcohol abuse who has a video visit for follow-up of GAD.  He was placed on Prozac, but he had to stop it after 1 month due to insomnia and daytime fatigue.  He was placed on Effexor for GAD in the past, but had to stop taking it as it did not help him. He has had anxiety or panic spells in the past.  He has tried Zoloft, Paxil, Celexa, Klonopin and Xanax in the past. He also had BuSpar and Vistaril as needed for anxiety.  Chart review also suggest history of OCD, used to follow up with psychiatry.  Denies any SI or HI currently.  He prefers to avoid psychiatry referral for now.  He also reports nasal congestion, postnasal drip and right-sided maxillary area pain for the last 3 days.  Denies any fever or chills.  He has tried using Flonase without much relief.  Denies any dyspnea or wheezing currently.  The patient does not have symptoms concerning for COVID-19 infection (fever, chills, cough, or new shortness of breath).   Past Medical, Surgical,  Social History, Allergies, and Medications have been Reviewed.  Past Medical History:  Diagnosis Date   Anxiety    Chronic   GERD (gastroesophageal reflux disease)    Headache    Neuroma    OCD (obsessive compulsive disorder)    Past Surgical History:  Procedure Laterality Date   BIOPSY  06/11/2021   Procedure: BIOPSY;  Surgeon: Corbin Ade, MD;  [facility];  Service: Endoscopy;;   CIRCUMCISION  2004   ESOPHAGOGASTRODUODENOSCOPY (EGD) WITH PROPOFOL N/A 08/10/2020   Procedure: ESOPHAGOGASTRODUODENOSCOPY (EGD) WITH PROPOFOL;  Surgeon: Corbin Ade, MD;  [facility];  Service: Endoscopy;  Laterality: N/A;  1:45pm   ESOPHAGOGASTRODUODENOSCOPY (EGD) WITH PROPOFOL N/A 06/11/2021   Procedure: ESOPHAGOGASTRODUODENOSCOPY (EGD) WITH PROPOFOL;  Surgeon: Corbin Ade, MD;  [facility];  Service: Endoscopy;  Laterality: N/A;  10:30am   EXCISION MORTON'S NEUROMA  10/16/2020   Procedure: EXCISION MORTON'S NEUROMA,AND REMOVAL OF INGROWN NAIL-GREAT TOES LEFT;  Surgeon: Candelaria Stagers, DPM;  Location: [facility];  Service: Podiatry;;   WISDOM TOOTH EXTRACTION  2011     No outpatient medications have been marked as taking for the 03/26/23 encounter (Video Visit) with Anabel Halon, MD.     Allergies:   Cat hair extract and Paroxetine hcl   ROS:   Please see the history of present illness.  All other systems reviewed and are negative.   Labs/Other Tests and Data Reviewed:    Recent Labs: 04/12/2022: ALT 45; BUN 4; Creatinine, Ser 0.81; Potassium 4.3; Sodium 140; TSH 1.640   Recent Lipid Panel Lab Results  Component Value Date/Time   CHOL 188 04/12/2022 09:50 AM   TRIG 114 04/12/2022 09:50 AM   HDL 50 04/12/2022 09:50 AM   CHOLHDL 3.8 04/12/2022 09:50 AM   CHOLHDL 2.2 03/26/2016 04:50 PM   LDLCALC 118 (H) 04/12/2022 09:50 AM    Wt Readings from Last 3 Encounters:  01/15/23 201 lb 12.8 oz (91.5 kg)  01/08/23 201 lb (91.2 kg)   11/18/22 202 lb 9.6 oz (91.9 kg)     Objective:    Vital Signs:  There were no vitals taken for this visit.   VITAL SIGNS:  reviewed GEN:  no acute distress EYES:  sclerae anicteric, EOMI - Extraocular Movements Intact RESPIRATORY:  normal respiratory effort, symmetric expansion NEURO:  alert and oriented x 3, no obvious focal deficit PSYCH:  normal affect  ASSESSMENT & PLAN:    OCD (obsessive compulsive disorder) Has h/o OCD Did not continue Prozac due to insomnia Has tried multiple other SSRIs, including Zoloft, Celexa, Paxil -had side effects Used to follow up with psychiatry, but prefers to avoid seeing psychiatry now  GAD (generalized anxiety disorder) Has tried multiple SSRIs in the past, including Zoloft, Celexa and Wellbutrin -caused drowsiness and feeling spacey; Paxil caused anorgasmia Chart review suggests that he had good response with Prozac in the past, had started Prozac, but he stopped taking it due to insomnia DCed Effexor as he did not continue treatment Had started Vistaril as needed for anxiety spells Avoid BZDs for now Started BuSpar 7.5 mg BID He is willing to try Wellbutrin, but will add later as he has not tolerated Wellbutrin in the past  Acute non-recurrent maxillary sinusitis Started empiric  Augmentin as he has persistent symptoms despite symptomatic treatment Nasal saline spray as needed for congestion Advised to use humidifier and/or vaporizer for nasal congestion Mucinex as needed for cough  I discussed the assessment and treatment plan with the patient. The patient was provided an opportunity to ask questions, and all were answered. The patient agreed with the plan and demonstrated an understanding of the instructions.   The patient was advised to call back or seek an in-person evaluation if the symptoms worsen or if the condition fails to improve as anticipated.  The above assessment and management plan was discussed with the patient. The  patient verbalized understanding of and has agreed to the management plan.   Medication Adjustments/Labs and Tests Ordered: Current medicines are reviewed at length with the patient today.  Concerns regarding medicines are outlined above.   Tests Ordered: No orders of the defined types were placed in this encounter.   Medication Changes: No orders of the defined types were placed in this encounter.    Note: This dictation was prepared with Dragon dictation along with smaller phrase technology. Similar sounding words can be transcribed inadequately or may not be corrected upon review. Any transcriptional errors that result from this process are unintentional.      Disposition:  Follow up  Signed, Anabel Halon, MD  03/26/2023 2:22 PM     [facility]

## 2023-03-26 NOTE — Assessment & Plan Note (Signed)
Has tried multiple SSRIs in the past, including Zoloft, Celexa and Wellbutrin -caused drowsiness and feeling spacey; Paxil caused anorgasmia Chart review suggests that he had good response with Prozac in the past, had started Prozac, but he stopped taking it due to insomnia DCed Effexor as he did not continue treatment Had started Vistaril as needed for anxiety spells Avoid BZDs for now Started BuSpar 7.5 mg BID He is willing to try Wellbutrin, but will add later as he has not tolerated Wellbutrin in the past

## 2023-03-26 NOTE — Assessment & Plan Note (Signed)
Started empiric  Augmentin as he has persistent symptoms despite symptomatic treatment Nasal saline spray as needed for congestion Advised to use humidifier and/or vaporizer for nasal congestion Mucinex as needed for cough

## 2023-04-12 ENCOUNTER — Other Ambulatory Visit: Payer: Self-pay | Admitting: Internal Medicine

## 2023-04-12 DIAGNOSIS — F411 Generalized anxiety disorder: Secondary | ICD-10-CM

## 2023-04-26 ENCOUNTER — Encounter (HOSPITAL_COMMUNITY): Payer: Self-pay | Admitting: *Deleted

## 2023-04-26 ENCOUNTER — Emergency Department (HOSPITAL_COMMUNITY)
Admission: EM | Admit: 2023-04-26 | Discharge: 2023-04-26 | Disposition: A | Discharge status: Home / Self Care | Payer: Medicaid Other | Attending: Emergency Medicine | Admitting: Emergency Medicine

## 2023-04-26 ENCOUNTER — Emergency Department (HOSPITAL_COMMUNITY): Payer: Medicaid Other

## 2023-04-26 ENCOUNTER — Telehealth: Payer: Self-pay | Admitting: Internal Medicine

## 2023-04-26 ENCOUNTER — Other Ambulatory Visit: Payer: Self-pay | Admitting: Gastroenterology

## 2023-04-26 ENCOUNTER — Other Ambulatory Visit: Payer: Self-pay

## 2023-04-26 DIAGNOSIS — K529 Noninfective gastroenteritis and colitis, unspecified: Secondary | ICD-10-CM | POA: Diagnosis not present

## 2023-04-26 DIAGNOSIS — K920 Hematemesis: Secondary | ICD-10-CM | POA: Insufficient documentation

## 2023-04-26 DIAGNOSIS — R000 Tachycardia, unspecified: Secondary | ICD-10-CM | POA: Diagnosis not present

## 2023-04-26 DIAGNOSIS — R197 Diarrhea, unspecified: Secondary | ICD-10-CM

## 2023-04-26 DIAGNOSIS — K219 Gastro-esophageal reflux disease without esophagitis: Secondary | ICD-10-CM

## 2023-04-26 LAB — DIFFERENTIAL
Abs Immature Granulocytes: 0.05 10*3/uL (ref 0.00–0.07)
Basophils Absolute: 0.1 10*3/uL (ref 0.0–0.1)
Basophils Relative: 1 %
Eosinophils Absolute: 0.1 10*3/uL (ref 0.0–0.5)
Eosinophils Relative: 1 %
Immature Granulocytes: 1 %
Lymphocytes Relative: 16 %
Lymphs Abs: 1.6 10*3/uL (ref 0.7–4.0)
Monocytes Absolute: 0.7 10*3/uL (ref 0.1–1.0)
Monocytes Relative: 7 %
Neutro Abs: 7.6 10*3/uL (ref 1.7–7.7)
Neutrophils Relative %: 74 %

## 2023-04-26 LAB — CBC
HCT: 55.3 % — ABNORMAL HIGH (ref 39.0–52.0)
Hemoglobin: 18.6 g/dL — ABNORMAL HIGH (ref 13.0–17.0)
MCH: 29.4 pg (ref 26.0–34.0)
MCHC: 33.6 g/dL (ref 30.0–36.0)
MCV: 87.4 fL (ref 80.0–100.0)
Platelets: 230 10*3/uL (ref 150–400)
RBC: 6.33 MIL/uL — ABNORMAL HIGH (ref 4.22–5.81)
RDW: 18 % — ABNORMAL HIGH (ref 11.5–15.5)
WBC: 10 10*3/uL (ref 4.0–10.5)
nRBC: 0 % (ref 0.0–0.2)

## 2023-04-26 LAB — COMPREHENSIVE METABOLIC PANEL
ALT: 47 U/L — ABNORMAL HIGH (ref 0–44)
AST: 53 U/L — ABNORMAL HIGH (ref 15–41)
Albumin: 4.3 g/dL (ref 3.5–5.0)
Alkaline Phosphatase: 85 U/L (ref 38–126)
Anion gap: 12 (ref 5–15)
BUN: 8 mg/dL (ref 6–20)
CO2: 25 mmol/L (ref 22–32)
Calcium: 9.3 mg/dL (ref 8.9–10.3)
Chloride: 99 mmol/L (ref 98–111)
Creatinine, Ser: 0.83 mg/dL (ref 0.61–1.24)
GFR, Estimated: >60 mL/min (ref >60)
Glucose, Bld: 111 mg/dL — ABNORMAL HIGH (ref 70–99)
Potassium: 3.8 mmol/L (ref 3.5–5.1)
Sodium: 136 mmol/L (ref 135–145)
Total Bilirubin: 1.9 mg/dL — ABNORMAL HIGH (ref 0.3–1.2)
Total Protein: 7.6 g/dL (ref 6.5–8.1)

## 2023-04-26 LAB — TYPE AND SCREEN
ABO/RH(D): O POS
Antibody Screen: NEGATIVE

## 2023-04-26 LAB — LIPASE, BLOOD: Lipase: 24 U/L (ref 11–51)

## 2023-04-26 MED ORDER — ONDANSETRON HCL 4 MG/2ML IJ SOLN
4.0000 mg | Freq: Once | INTRAMUSCULAR | Status: AC
  Administered 2023-04-26: 4 mg via INTRAVENOUS
  Filled 2023-04-26: qty 2

## 2023-04-26 MED ORDER — DICYCLOMINE HCL 20 MG PO TABS: 20.0000 mg | ORAL_TABLET | Freq: Two times a day (BID) | ORAL | 0 refills | Status: AC

## 2023-04-26 MED ORDER — ALUM & MAG HYDROXIDE-SIMETH 200-200-20 MG/5ML PO SUSP
30.0000 mL | Freq: Once | ORAL | Status: AC
  Administered 2023-04-26: 30 mL via ORAL
  Filled 2023-04-26: qty 30

## 2023-04-26 MED ORDER — LACTATED RINGERS IV BOLUS
1000.0000 mL | Freq: Once | INTRAVENOUS | Status: AC
  Administered 2023-04-26: 1000 mL via INTRAVENOUS

## 2023-04-26 MED ORDER — IOHEXOL 300 MG/ML  SOLN
100.0000 mL | Freq: Once | INTRAMUSCULAR | Status: AC | PRN
  Administered 2023-04-26: 100 mL via INTRAVENOUS

## 2023-04-26 MED ORDER — PANTOPRAZOLE SODIUM 40 MG IV SOLR
40.0000 mg | Freq: Once | INTRAVENOUS | Status: AC
  Administered 2023-04-26: 40 mg via INTRAVENOUS
  Filled 2023-04-26: qty 10

## 2023-04-26 MED ORDER — FAMOTIDINE 20 MG PO TABS
20.0000 mg | ORAL_TABLET | Freq: Once | ORAL | Status: AC
  Administered 2023-04-26: 20 mg via ORAL
  Filled 2023-04-26: qty 1

## 2023-04-26 MED ORDER — ONDANSETRON 4 MG PO TBDP: 4.0000 mg | ORAL_TABLET | Freq: Three times a day (TID) | ORAL | 0 refills | Status: AC | PRN

## 2023-04-26 MED ORDER — MORPHINE SULFATE (PF) 4 MG/ML IV SOLN: 4.0000 mg | Freq: Once | INTRAVENOUS | Status: DC

## 2023-04-26 NOTE — ED Triage Notes (Signed)
Pt states he ate hamburgers from [employer] last night and this am he woke up with 2 episodes of loose stools and one episode of vomiting; pt states the emesis looked like "coffee grounds"  Pt c/o some abdominal pain  Pt states his BMs were normal in color

## 2023-04-26 NOTE — Discharge Instructions (Signed)
Assuring.  Your case was discussed with gastroenterologist Dr. Marletta Lor.  His office will call you Monday to schedule an appointment to set up endoscopy.  We did discuss admission to the hospital for inpatient workup however you state you will follow-up in clinic unless you have any change in symptoms she will return to the emergency room sooner.  I have sent a couple medications into the pharmacy for you.  For any concerning symptoms return to the emergency room.

## 2023-04-26 NOTE — Telephone Encounter (Signed)
ER called me on this patient, presented today with suspected coffee-ground emesis after eating fast food.  Hemoglobin stable, hemodynamically stable.  Can we arrange urgent ER follow-up visit this week with app or Dr. Jena Gauss?  Likely needs repeat endoscopy in the near future.  Thank you

## 2023-04-26 NOTE — ED Provider Notes (Signed)
[place] EMERGENCY DEPARTMENT AT [facility] Provider Note   CSN: [id number] Arrival date & time: 04/26/23  1028     History  Chief Complaint  Patient presents with   Emesis    Christopher Wyatt. is a 47 y.o. male.  47 year old male with past medical history significant for gastric ulcer presents today for concern of coffee-ground emesis.  He states he ate 2 hamburgers from [employer] yesterday around 6:30 PM.  Noted some abdominal discomfort around 8:30-9 PM which improved after defecating.  He noticed loose stools at that time.  Patient again had the abdominal discomfort this morning.  He states he felt nauseous and threw up what appeared to be coffee-ground emesis.  He states he noticed few specks which were consistent with the hamburger.  Denies any melanotic stools or bright red blood per rectum.  States he had an endoscopy done 2 years ago which showed he had small gastric ulcer.  The history is provided by the patient. No language interpreter was used.       Home Medications Prior to Admission medications   Medication Sig Start Date End Date Taking? Authorizing Provider  amoxicillin-clavulanate (AUGMENTIN) 875-125 MG tablet Take 1 tablet by mouth 2 (two) times daily. 03/26/23   Anabel Halon, MD  busPIRone (BUSPAR) 7.5 MG tablet Take 1 tablet (7.5 mg total) by mouth 2 (two) times daily. 03/26/23   Anabel Halon, MD  dexlansoprazole (DEXILANT) 60 MG capsule TAKE 1 CAPSULE(60 MG) BY MOUTH DAILY 02/15/22   Tiffany Kocher, PA-C  diclofenac (VOLTAREN) 75 MG EC tablet Take 75 mg by mouth 2 (two) times daily. 10/12/22   [provider]  sertraline (ZOLOFT) 100 MG tablet 50 mg daily for four days, then 100 mg daily for four days, then 150 mg daily 12/27/20 02/08/21  Neysa Hotter, MD      Allergies    Cat hair extract and Paroxetine hcl    Review of Systems   Review of Systems  Constitutional:  Negative for chills and fever.  Gastrointestinal:  Positive for  abdominal pain. Negative for blood in stool, nausea and vomiting.  Neurological:  Negative for light-headedness.  All other systems reviewed and are negative.   Physical Exam Updated Vital Signs BP 124/89 (BP Location: Right Arm)   Pulse (!) 107   Temp 97.7 F (36.5 C) (Oral)   Resp 16   Ht 6' (1.829 m)   Wt 83 kg   SpO2 91%   BMI 24.82 kg/m  Physical Exam Vitals and nursing note reviewed.  Constitutional:      General: He is not in acute distress.    Appearance: Normal appearance. He is not ill-appearing.  HENT:     Head: Normocephalic and atraumatic.     Nose: Nose normal.  Eyes:     General: No scleral icterus.    Extraocular Movements: Extraocular movements intact.     Conjunctiva/sclera: Conjunctivae normal.  Cardiovascular:     Rate and Rhythm: Regular rhythm. Tachycardia present.     Heart sounds: Normal heart sounds.  Pulmonary:     Effort: Pulmonary effort is normal. No respiratory distress.     Breath sounds: Normal breath sounds. No wheezing or rales.  Abdominal:     General: There is no distension.     Tenderness: There is no abdominal tenderness. There is no guarding.  Musculoskeletal:        General: Normal range of motion.     Cervical  back: Normal range of motion.  Skin:    General: Skin is warm and dry.  Neurological:     General: No focal deficit present.     Mental Status: He is alert. Mental status is at baseline.     ED Results / Procedures / Treatments   Labs (all labs ordered are listed, but only abnormal results are displayed) Labs Reviewed  COMPREHENSIVE METABOLIC PANEL  CBC  DIFFERENTIAL  LIPASE, BLOOD  POC OCCULT BLOOD, ED  TYPE AND SCREEN    EKG None  Radiology No results found.  Procedures Procedures    Medications Ordered in ED Medications  pantoprazole (PROTONIX) injection 40 mg (has no administration in time range)  ondansetron (ZOFRAN) injection 4 mg (has no administration in time range)  morphine (PF) 4 MG/ML  injection 4 mg (has no administration in time range)  lactated ringers bolus 1,000 mL (has no administration in time range)    ED Course/ Medical Decision Making/ A&P                                 Medical Decision Making Amount and/or Complexity of Data Reviewed Labs: ordered. Radiology: ordered.  Risk OTC drugs. Prescription drug management.   Medical Decision Making / ED Course   This patient presents to the ED for concern of abdominal pain, vomiting, this involves an extensive number of treatment options, and is a complaint that carries with it a high risk of complications and morbidity.  The differential diagnosis includes gastroenteritis, colitis, pancreatitis, gastritis, GI bleed  MDM: 47 year old male with past medical history significant as noted above presents today for concern of coffee-ground emesis.  Hemodynamically he is stable with mild tachycardia initially.  This did improve with fluid bolus.  Mild generalized abdominal tenderness.  Denies flank pain, dysuria.  Given his history of gastritis and gastric ulcer discussion had with Dr. Marletta Lor of gastroenterology.  Given the workup Dr. Marletta Lor feels patient may be stable to follow-up in clinic for an urgent visit with urgent endoscopy.  Shared decision making had with patient regarding discharge with close follow-up with gastroenterology versus admission for inpatient workup.  Patient is agreeable for outpatient workup.  Strict return precautions given.  CT obtained which shows no evidence of acute intra-abdominal process.  Does show changes consistent with enteritis.  Fits the clinical picture of gastroenteritis.  Symptomatic management discussed.  Discharged in stable condition.  He will follow-up with gastroenterology.  Patient discharged in stable condition.  Return precautions discussed.  Patient voices understanding and is in agreement with plan.   Additional history obtained: -Additional history obtained from chart  review of endoscopy procedures from 2021 and 2022 which showed gastritis which resolved on repeat endoscopy in 2022. -External records from outside source obtained and reviewed including: Chart review including previous notes, labs, imaging, consultation notes   Lab Tests: -I ordered, reviewed, and interpreted labs.   The pertinent results include:   Labs Reviewed  COMPREHENSIVE METABOLIC PANEL - Abnormal; Notable for the following components:      Result Value   Glucose, Bld 111 (*)    AST 53 (*)    ALT 47 (*)    Total Bilirubin 1.9 (*)    All other components within normal limits  CBC - Abnormal; Notable for the following components:   RBC 6.33 (*)    Hemoglobin 18.6 (*)    HCT 55.3 (*)    RDW 18.0 (*)  All other components within normal limits  DIFFERENTIAL  LIPASE, BLOOD  POC OCCULT BLOOD, ED  TYPE AND SCREEN      EKG  EKG Interpretation Date/Time:    Ventricular Rate:    PR Interval:    QRS Duration:    QT Interval:    QTC Calculation:   R Axis:      Text Interpretation:           Imaging Studies ordered: I ordered imaging studies including CT abdomen pelvis with contrast I independently visualized and interpreted imaging. I agree with the radiologist interpretation   Medicines ordered and prescription drug management: Meds ordered this encounter  Medications   pantoprazole (PROTONIX) injection 40 mg   ondansetron (ZOFRAN) injection 4 mg   morphine (PF) 4 MG/ML injection 4 mg   lactated ringers bolus 1,000 mL   alum & mag hydroxide-simeth (MAALOX/MYLANTA) 200-200-20 MG/5ML suspension 30 mL   famotidine (PEPCID) tablet 20 mg   iohexol (OMNIPAQUE) 300 MG/ML solution 100 mL    -I have reviewed the patients home medicines and have made adjustments as needed  Consultations Obtained: I requested consultation with the gastroenterology ,  and discussed lab and imaging findings as well as pertinent plan - they recommend: As above   Cardiac  Monitoring: The patient was maintained on a cardiac monitor.  I personally viewed and interpreted the cardiac monitored which showed an underlying rhythm of: Initially sinus tachycardia improved to normal sinus rhythm  Reevaluation: After the interventions noted above, I reevaluated the patient and found that they have :improved  Co morbidities that complicate the patient evaluation  Past Medical History:  Diagnosis Date   Anxiety    Chronic   GERD (gastroesophageal reflux disease)    Headache    Neuroma    OCD (obsessive compulsive disorder)       Dispostion: Patient discharged in stable condition.  Return precautions discussed.  Patient voices understanding and is in agreement with plan.  Final Clinical Impression(s) / ED Diagnoses Final diagnoses:  Gastroenteritis  Coffee ground emesis    Rx / DC Orders ED Discharge Orders          Ordered    dicyclomine (BENTYL) 20 MG tablet  2 times daily        04/26/23 1331    ondansetron (ZOFRAN-ODT) 4 MG disintegrating tablet  Every 8 hours PRN        04/26/23 1331              Marita Kansas, PA-C 04/26/23 1331    Cathren Laine, MD 04/26/23 1456

## 2023-04-28 NOTE — Telephone Encounter (Addendum)
This patient has been discharged from the practice and is now a [facility] patient. Message sent to Dr. Marletta Lor letting him know.  I also called pt, received VM after 1 ring. I did leave a detail messaging him to contact [facility] and left phone # for him to call to get an appointment with them.

## 2023-05-07 ENCOUNTER — Other Ambulatory Visit: Payer: Self-pay | Admitting: Gastroenterology

## 2023-05-07 ENCOUNTER — Other Ambulatory Visit: Payer: Self-pay

## 2023-05-07 ENCOUNTER — Telehealth: Payer: Self-pay | Admitting: Internal Medicine

## 2023-05-07 DIAGNOSIS — K219 Gastro-esophageal reflux disease without esophagitis: Secondary | ICD-10-CM

## 2023-05-07 DIAGNOSIS — R197 Diarrhea, unspecified: Secondary | ICD-10-CM

## 2023-05-07 MED ORDER — DEXLANSOPRAZOLE 60 MG PO CPDR
DELAYED_RELEASE_CAPSULE | ORAL | 3 refills | Status: DC
Start: 2023-05-07 — End: 2024-04-26

## 2023-05-07 NOTE — Telephone Encounter (Signed)
Prescription Request  05/07/2023  LOV: 01/15/2023  What is the name of the medication or equipment? dexlansoprazole (DEXILANT) 60  MG capsule  (no longer get from gastro providers)   Have you contacted your pharmacy to request a refill? No   Which pharmacy would you like this sent to?  [facility] [address]   Patient notified that their request is being sent to the clinical staff for review and that they should receive a response within 2 business days.   Please advise at Mobile [phone] (mobile)

## 2023-05-07 NOTE — Telephone Encounter (Signed)
Refills sent to pharmacy. 

## 2023-05-29 ENCOUNTER — Other Ambulatory Visit: Payer: Self-pay | Admitting: Internal Medicine

## 2023-05-29 DIAGNOSIS — F411 Generalized anxiety disorder: Secondary | ICD-10-CM

## 2023-08-05 ENCOUNTER — Telehealth: Payer: Self-pay

## 2023-08-05 ENCOUNTER — Telehealth: Payer: Self-pay | Admitting: Internal Medicine

## 2023-08-05 NOTE — Telephone Encounter (Signed)
Appt scheduled

## 2023-08-05 NOTE — Telephone Encounter (Signed)
Sent to front desk for scheduling.

## 2023-08-05 NOTE — Telephone Encounter (Signed)
Copied from CRM [id number]. Topic: Referral - Request for Referral >> Aug 05, 2023  2:37 PM Fonda Kinder J wrote: Did the patient discuss referral with their provider in the last year? Yes (If No - schedule appointment) (If Yes - send message)  Appointment offered? No  Type of order/referral and detailed reason for visit: Patient is requesting a referral for a CT Cardiac test   Preference of office, provider, location: n/a  If referral order, have you been seen by this specialty before? No (If Yes, this issue or another issue? When? Where?  Can we respond through MyChart? No

## 2023-08-05 NOTE — Telephone Encounter (Signed)
Copied from CRM [id number]. Topic: Referral - Request for Referral >> Aug 05, 2023 12:51 PM Maxwell Marion wrote:   Did the patient discuss referral with their provider in the last year? No (If No - schedule appointment) (If Yes - send message)  Appointment offered? Pt doesn't feel like he needs to be seen but if an apt has to be scheduled, he would like a call to schedule apt   Type of order/referral and detailed reason for visit: Dermatologist, there's a small wart behind his ear, he already spoke with dermatologist center who said they could freeze it off  Preference of office, provider, location: [facility] [place]  If referral order, have you been seen by this specialty before? No (If Yes, this issue or another issue? When? Where?  Can we respond through MyChart? Yes

## 2023-08-06 ENCOUNTER — Telehealth: Payer: Medicaid Other | Admitting: Internal Medicine

## 2023-08-06 ENCOUNTER — Encounter: Payer: Self-pay | Admitting: Internal Medicine

## 2023-08-06 DIAGNOSIS — Z9189 Other specified personal risk factors, not elsewhere classified: Secondary | ICD-10-CM

## 2023-08-06 DIAGNOSIS — Z8249 Family history of ischemic heart disease and other diseases of the circulatory system: Secondary | ICD-10-CM | POA: Diagnosis not present

## 2023-08-06 DIAGNOSIS — H6191 Disorder of right external ear, unspecified: Secondary | ICD-10-CM | POA: Insufficient documentation

## 2023-08-06 NOTE — Assessment & Plan Note (Signed)
Has wartlike/hyperkeratotic plaque over right ear helix Has tried OTC wart remover without much relief Referred to dermatology at [facility] clinic as per patient request

## 2023-08-06 NOTE — Patient Instructions (Signed)
You are being referred to [facility] dermatology.  You are being scheduled to get CT coronary calcium scoring test.

## 2023-08-06 NOTE — Assessment & Plan Note (Signed)
Reports that his father and paternal grandmother has history of CAD He has smoking history and HLD - at relatively high risk for CAD, but he prefers to get CT coronary-ordered

## 2023-08-06 NOTE — Assessment & Plan Note (Signed)
Due to smoking history and HLD, ordered CT coronary after discussing with the patient -currently asymptomatic, denies chest pain or palpitations

## 2023-08-06 NOTE — Progress Notes (Signed)
Virtual Visit via Video Note   Because of Christopher A Rouse Jr.'s co-morbid illnesses, he is at least at moderate risk for complications without adequate follow up.  This format is felt to be most appropriate for this patient at this time.  All issues noted in this document were discussed and addressed.  A limited physical exam was performed with this format.      Evaluation Performed:  Follow-up visit  Date:  08/06/2023   ID:  Christopher Martin., DOB [birth year], MRN [id number]  Patient Location: Home Provider Location: Office/Clinic  Participants: Patient Location of Patient: Home Location of Provider: Telehealth Consent was obtain for visit to be over via telehealth. I verified that I am speaking with the correct person using two identifiers.  PCP:  Anabel Halon, MD   Chief Complaint: Skin lesion over right ear  History of Present Illness:    Christopher Mcclay. is a 47 y.o. male who has a video visit for a skin lesion over right ear, which is chronic.  He has noticed slight increase in size of the black over the right ear helix.  He has tried using OTC wart remover solution without much relief.  He requests dermatology referral for removal of the skin lesion.  He also reports family history of CAD.  He currently denies any chest pain or palpitations.  He is worried about his risk of coronary artery disease and wants to discuss about CT coronary calcium scoring.  The patient does not have symptoms concerning for COVID-19 infection (fever, chills, cough, or new shortness of breath).   Past Medical, Surgical, Social History, Allergies, and Medications have been Reviewed.  Past Medical History:  Diagnosis Date   Anxiety    Chronic   GERD (gastroesophageal reflux disease)    Headache    Neuroma    OCD (obsessive compulsive disorder)    Past Surgical History:  Procedure Laterality Date   BIOPSY  06/11/2021   Procedure: BIOPSY;  Surgeon: Corbin Ade, MD;   Location: [facility];  Service: Endoscopy;;   CIRCUMCISION  2004   ESOPHAGOGASTRODUODENOSCOPY (EGD) WITH PROPOFOL N/A 08/10/2020   Procedure: ESOPHAGOGASTRODUODENOSCOPY (EGD) WITH PROPOFOL;  Surgeon: Corbin Ade, MD;  Location: [facility];  Service: Endoscopy;  Laterality: N/A;  1:45pm   ESOPHAGOGASTRODUODENOSCOPY (EGD) WITH PROPOFOL N/A 06/11/2021   Procedure: ESOPHAGOGASTRODUODENOSCOPY (EGD) WITH PROPOFOL;  Surgeon: Corbin Ade, MD;  Location: [facility];  Service: Endoscopy;  Laterality: N/A;  10:30am   EXCISION MORTON'S NEUROMA  10/16/2020   Procedure: EXCISION MORTON'S NEUROMA,AND REMOVAL OF INGROWN NAIL-GREAT TOES LEFT;  Surgeon: Candelaria Stagers, DPM;  Location: [facility];  Service: Podiatry;;   WISDOM TOOTH EXTRACTION  2011     No outpatient medications have been marked as taking for the 08/06/23 encounter (Video Visit) with Anabel Halon, MD.     Allergies:   Cat hair extract and Paroxetine hcl   ROS:   Please see the history of present illness.    All other systems reviewed and are negative.   Labs/Other Tests and Data Reviewed:    Recent Labs: 04/26/2023: ALT 47; BUN 8; Creatinine, Ser 0.83; Hemoglobin 18.6; Platelets 230; Potassium 3.8; Sodium 136   Recent Lipid Panel Lab Results  Component Value Date/Time   CHOL 188 04/12/2022 09:50 AM   TRIG 114 04/12/2022 09:50 AM   HDL 50 04/12/2022 09:50 AM   CHOLHDL 3.8 04/12/2022 09:50 AM  CHOLHDL 2.2 03/26/2016 04:50 PM   LDLCALC 118 (H) 04/12/2022 09:50 AM    Wt Readings from Last 3 Encounters:  04/26/23 183 lb (83 kg)  01/15/23 201 lb 12.8 oz (91.5 kg)  01/08/23 201 lb (91.2 kg)     Objective:    Vital Signs:  There were no vitals taken for this visit.   VITAL SIGNS:  reviewed GEN:  no acute distress EYES:  sclerae anicteric, EOMI - Extraocular Movements Intact RESPIRATORY:  normal respiratory effort, symmetric expansion NEURO:  alert and oriented x 3, no obvious focal  deficit PSYCH:  normal affect  ASSESSMENT & PLAN:    Family history of coronary artery disease Reports that his father and paternal grandmother has history of CAD He has smoking history and HLD - at relatively high risk for CAD, but he prefers to get CT coronary-ordered  At risk for coronary artery disease Due to smoking history and HLD, ordered CT coronary after discussing with the patient -currently asymptomatic, denies chest pain or palpitations  Skin lesion of right ear Has wartlike/hyperkeratotic plaque over right ear helix Has tried OTC wart remover without much relief Referred to dermatology at [facility] clinic as per patient request    I discussed the assessment and treatment plan with the patient. The patient was provided an opportunity to ask questions, and all were answered. The patient agreed with the plan and demonstrated an understanding of the instructions.   The patient was advised to call back or seek an in-person evaluation if the symptoms worsen or if the condition fails to improve as anticipated.  The above assessment and management plan was discussed with the patient. The patient verbalized understanding of and has agreed to the management plan.   Medication Adjustments/Labs and Tests Ordered: Current medicines are reviewed at length with the patient today.  Concerns regarding medicines are outlined above.   Tests Ordered: No orders of the defined types were placed in this encounter.   Medication Changes: No orders of the defined types were placed in this encounter.    Note: This dictation was prepared with Dragon dictation along with smaller phrase technology. Similar sounding words can be transcribed inadequately or may not be corrected upon review. Any transcriptional errors that result from this process are unintentional.      Disposition:  Follow up  Signed, Anabel Halon, MD  08/06/2023 11:09 AM     [facility]

## 2023-08-13 ENCOUNTER — Telehealth: Payer: Self-pay | Admitting: Internal Medicine

## 2023-08-13 NOTE — Telephone Encounter (Signed)
Copied from CRM [id number]. Topic: Referral - Status >> Aug 13, 2023 12:30 PM Alvino Blood C wrote: Reason for CRM: Pt called to confirm if referral was sent to [facility] Dermatology at [facility], because he was informed they never recv;d it.

## 2023-08-15 ENCOUNTER — Ambulatory Visit (HOSPITAL_COMMUNITY)
Admission: RE | Admit: 2023-08-15 | Discharge: 2023-08-15 | Disposition: A | Discharge status: Home / Self Care | Payer: Medicaid Other | Source: Ambulatory Visit | Attending: Internal Medicine

## 2023-08-15 DIAGNOSIS — Z9189 Other specified personal risk factors, not elsewhere classified: Secondary | ICD-10-CM | POA: Insufficient documentation

## 2023-08-15 DIAGNOSIS — Z8249 Family history of ischemic heart disease and other diseases of the circulatory system: Secondary | ICD-10-CM | POA: Insufficient documentation

## 2023-08-25 ENCOUNTER — Other Ambulatory Visit: Payer: Self-pay | Admitting: Internal Medicine

## 2023-08-25 DIAGNOSIS — I2510 Atherosclerotic heart disease of native coronary artery without angina pectoris: Secondary | ICD-10-CM | POA: Insufficient documentation

## 2023-08-25 DIAGNOSIS — E782 Mixed hyperlipidemia: Secondary | ICD-10-CM

## 2023-08-25 MED ORDER — ROSUVASTATIN CALCIUM 10 MG PO TABS: 10.0000 mg | ORAL_TABLET | Freq: Every day | ORAL | 1 refills | Status: DC

## 2023-08-25 NOTE — Progress Notes (Signed)
Patient advised.

## 2024-01-02 ENCOUNTER — Ambulatory Visit (INDEPENDENT_AMBULATORY_CARE_PROVIDER_SITE_OTHER): Payer: Self-pay

## 2024-01-02 VITALS — BP 121/85 | HR 80 | Ht 72.0 in | Wt 207.0 lb

## 2024-01-02 DIAGNOSIS — M542 Cervicalgia: Secondary | ICD-10-CM | POA: Diagnosis not present

## 2024-01-02 DIAGNOSIS — G4489 Other headache syndrome: Secondary | ICD-10-CM | POA: Diagnosis not present

## 2024-01-02 MED ORDER — METHOCARBAMOL 750 MG PO TABS
750.0000 mg | ORAL_TABLET | Freq: Three times a day (TID) | ORAL | 2 refills | Status: AC | PRN
Start: 2024-01-02 — End: ?

## 2024-01-02 NOTE — Progress Notes (Signed)
 Acute Office Visit  Subjective:     Patient ID: Christopher Wyatt., male    DOB: [birth year], 48 y.o.   MRN: [id number]  Chief Complaint  Patient presents with   Headache    Pain on the left side of the head for nine days, patient has been taking aspirin to help    HPI Patient is in today for Headache: Patient complains of headache. He does have a headache at this time.   Description of Headaches: Location of pain: left-sided unilateral, parietal Radiation of pain?:none Character of pain:aching and dull Severity of pain: 5 Accompanying symptoms: None Prodromal sx?: None Rapidity of onset: gradual Typical duration of individual headache: a few days Are most headaches similar in presentation? yes Typical precipitants: tobacco  Temporal Pattern of Headaches: Started having HAs 5 days ago Worst time of day: mid-day Awaken from sleep?: no Seasonal pattern?: no 'Clustering' of HAs over time? no Overall pattern since problem began: gradually improving  Degree of Functional Impairment: mild  Current Use of Meds to Treat HA: Abortive meds? NSAIDs, aspirin/acetaminophen /caffeine Daily use? yes - nsaids Prophylactic meds? no  Additional Relevant History: History of head/neck trauma? no History of head/neck surgery? no Family h/o headache problems? no Use of meds that might worsen HAs? no Exposure to carbon monoxide? no Substance use: tobacco: cigarettes   ROS      Objective:    BP 121/85   Pulse 80   Ht 6' (1.829 m)   Wt 207 lb (93.9 kg)   SpO2 96%   BMI 28.07 kg/m    Physical Exam Vitals and nursing note reviewed.  Constitutional:      Appearance: Normal appearance.  HENT:     Head: Normocephalic.     Right Ear: Tympanic membrane, ear canal and external ear normal.     Left Ear: Tympanic membrane, ear canal and external ear normal.     Nose: Nose normal.     Mouth/Throat:     Mouth: Mucous membranes are moist.     Pharynx: Oropharynx is clear.   Eyes:     Extraocular Movements: Extraocular movements intact.     Pupils: Pupils are equal, round, and reactive to light.  Cardiovascular:     Rate and Rhythm: Normal rate and regular rhythm.  Pulmonary:     Effort: Pulmonary effort is normal.     Breath sounds: Normal breath sounds.  Musculoskeletal:     Cervical back: Normal range of motion and neck supple.  Skin:    General: Skin is warm and dry.  Neurological:     Mental Status: He is alert and oriented to person, place, and time.  Psychiatric:        Mood and Affect: Mood normal.        Thought Content: Thought content normal.     No results found for any visits on 01/02/24.      Assessment & Plan:   Problem List Items Addressed This Visit       Other   Cervical pain (neck)   Relevant Medications   methocarbamol  (ROBAXIN -750) 750 MG tablet   Other headache syndrome - Primary   Relevant Medications   methocarbamol  (ROBAXIN -750) 750 MG tablet    Meds ordered this encounter  Medications   methocarbamol  (ROBAXIN -750) 750 MG tablet    Sig: Take 1 tablet (750 mg total) by mouth every 8 (eight) hours as needed for muscle spasms.    Dispense:  45 tablet  Refill:  2    Return in about 3 months (around 04/02/2024).  Alison Irvine, FNP

## 2024-01-02 NOTE — Patient Instructions (Signed)
 Recommend scheduling routine f/u as needed.    Recommend neurology consult if no improvement.

## 2024-01-05 ENCOUNTER — Ambulatory Visit: Payer: Self-pay

## 2024-01-05 NOTE — Telephone Encounter (Signed)
 Copied from CRM [id number]. Topic: Clinical - Red Word Triage >> Jan 05, 2024  1:18 PM Carla L wrote: [facility] that prompted transfer to Nurse Triage: still having pain in left side of head   Chief Complaint: Headache  Symptoms: Left sided headache  Frequency: Constant  Pertinent Negatives: Patient denies any other symptom at this time  Disposition: [] ED /[] Urgent Care (no appt availability in office) / [x] Appointment(In office/virtual)/ []  [place] [facility]/ [] Home Care/ [] Refused Recommended Disposition /[] [place] [facility]/ []  Follow-up with PCP Additional Notes: Patient states he was seen on Friday for a headache that has been present for 2 weeks. He states that he was prescribed Robaxin  which he has been taking without any change in his headache. Patient would like an appointment to be reevaluated for his headache. Appointment made for the patient on 4/30. Patient instructed to call back for new or worsening symptoms. Patient verbalized understanding and agreement with this plan.     Reason for Disposition  [1] MILD-MODERATE headache AND [2] present > 72 hours  Answer Assessment - Initial Assessment Questions 1. LOCATION: "Where does it hurt?"      Above left ear  2. ONSET: "When did the headache start?" (Minutes, hours or days)      2 weeks ago  3. PATTERN: "Does the pain come and go, or has it been constant since it started?"     Constant  4. SEVERITY: "How bad is the pain?" and "What does it keep you from doing?"  (e.g., Scale 1-10; mild, moderate, or severe)   - MILD (1-3): doesn't interfere with normal activities    - MODERATE (4-7): interferes with normal activities or awakens from sleep    - SEVERE (8-10): excruciating pain, unable to do any normal activities        Moderate to severe  5. RECURRENT SYMPTOM: "Have you ever had headaches before?" If Yes, ask: "When was the last time?" and "What happened that time?"      No 6. CAUSE: "What do you think is causing the  headache?"     Unsure  7. MIGRAINE: "Have you been diagnosed with migraine headaches?" If Yes, ask: "Is this headache similar?"      No 8. HEAD INJURY: "Has there been any recent injury to the head?"      No 9. OTHER SYMPTOMS: "Do you have any other symptoms?" (fever, stiff neck, eye pain, sore throat, cold symptoms)     No  Protocols used: Headache-A-AH

## 2024-01-05 NOTE — Telephone Encounter (Signed)
Noted appointment scheduled.

## 2024-01-07 ENCOUNTER — Ambulatory Visit (INDEPENDENT_AMBULATORY_CARE_PROVIDER_SITE_OTHER): Payer: Self-pay | Admitting: Internal Medicine

## 2024-01-07 ENCOUNTER — Encounter: Payer: Self-pay | Admitting: Internal Medicine

## 2024-01-07 VITALS — BP 134/89 | HR 106 | Ht 72.0 in | Wt 205.8 lb

## 2024-01-07 DIAGNOSIS — G4452 New daily persistent headache (NDPH): Secondary | ICD-10-CM | POA: Diagnosis not present

## 2024-01-07 DIAGNOSIS — J0111 Acute recurrent frontal sinusitis: Secondary | ICD-10-CM | POA: Insufficient documentation

## 2024-01-07 MED ORDER — AMOXICILLIN-POT CLAVULANATE 875-125 MG PO TABS
1.0000 | ORAL_TABLET | Freq: Two times a day (BID) | ORAL | 0 refills | Status: DC
Start: 2024-01-07 — End: 2024-05-13

## 2024-01-07 NOTE — Patient Instructions (Addendum)
 Please take Augmentin  as prescribed.  Please perform warm water  gargling at least 3-4 times in a day.  Please use vaporizer for nasal congestion.  Please perform sinus rinse as tolerated.

## 2024-01-07 NOTE — Assessment & Plan Note (Signed)
 Started empiric  Augmentin  as he has persistent headache Nasal saline spray as needed for congestion Advised to use humidifier and/or vaporizer for nasal congestion Advised to perform sinus rinse

## 2024-01-07 NOTE — Progress Notes (Signed)
 Acute Office Visit  Subjective:    Patient ID: Christopher Wyatt., male    DOB: 17-Aug-1976, 48 y.o.   MRN: [id number]  Chief Complaint  Patient presents with   Headache    Pt reports sx of headache ongoing for 2 weeks.     HPI Patient is in today for complaint of sudden onset left-sided headache for the last 2 weeks.  Headache started suddenly in the morning when he woke up about 2 weeks ago.  Headache is constant, dull, worse in the morning and nonradiating.  He had a visit with NP in the last week, when he had also mentioned about mild discomfort in the neck and was given Robaxin , which did not improve his symptoms.  Of note, he has history of maxillary sinusitis in the past.  He currently denies any rhinorrhea, but does report sore throat and discomfort beneath the left ear.  Past Medical History:  Diagnosis Date   Anxiety    Chronic   GERD (gastroesophageal reflux disease)    Headache    Neuroma    OCD (obsessive compulsive disorder)     Past Surgical History:  Procedure Laterality Date   BIOPSY  06/11/2021   Procedure: BIOPSY;  Surgeon: Suzette Espy, MD;  [facility];  Service: Endoscopy;;   CIRCUMCISION  2004   ESOPHAGOGASTRODUODENOSCOPY (EGD) WITH PROPOFOL  N/A 08/10/2020   Procedure: ESOPHAGOGASTRODUODENOSCOPY (EGD) WITH PROPOFOL ;  Surgeon: Suzette Espy, MD;  [facility];  Service: Endoscopy;  Laterality: N/A;  1:45pm   ESOPHAGOGASTRODUODENOSCOPY (EGD) WITH PROPOFOL  N/A 06/11/2021   Procedure: ESOPHAGOGASTRODUODENOSCOPY (EGD) WITH PROPOFOL ;  Surgeon: Suzette Espy, MD;  [facility];  Service: Endoscopy;  Laterality: N/A;  10:30am   EXCISION MORTON'S NEUROMA  10/16/2020   Procedure: EXCISION MORTON'S NEUROMA,AND REMOVAL OF INGROWN NAIL-GREAT TOES LEFT;  Surgeon: Velma Ghazi, DPM;  Location: [facility];  Service: Podiatry;;   WISDOM TOOTH EXTRACTION  2011    Family History  Problem Relation Age of Onset    Depression Mother    Anxiety disorder Mother    Colon polyps Father    Colon cancer Father    Hypertension Father    Depression Sister    Alcohol abuse Maternal Uncle    Bipolar disorder Paternal Uncle    Schizophrenia Paternal Uncle    Esophageal cancer Maternal Grandmother    Throat cancer Maternal Grandmother    Rectal cancer Neg Hx    Stomach cancer Neg Hx     Social History   Socioeconomic History   Marital status: Significant Other    Spouse name: Not on file   Number of children: 1   Years of education: 12   Highest education level: High school graduate  Occupational History   Occupation: local truck driver  Tobacco Use   Smoking status: Every Day    Current packs/day: 0.50    Average packs/day: 0.5 packs/day for 4.0 years (2.0 ttl pk-yrs)    Types: Cigarettes   Smokeless tobacco: Former    Types: Snuff  Vaping Use   Vaping status: Never Used  Substance and Sexual Activity   Alcohol use: Not on file    Comment: 1-2 glasses of wine  few x week   Drug use: No   Sexual activity: Yes  Other Topics Concern   Not on file  Social History Narrative   Lives at home, along with his son and parents.   Left-sided.  1-3 cup coffee, occasional soda.   Social Drivers of Corporate investment banker Strain: Not on file  Food Insecurity: Not on file  Transportation Needs: Not on file  Physical Activity: Not on file  Stress: Stress Concern Present (02/01/2022)   [employer] of Occupational Health - Occupational Stress Questionnaire    Feeling of Stress : Rather much  Social Connections: Unknown (01/08/2022)   Received from [facility]   Social Network    Social Network: Not on file  Intimate Partner Violence: Unknown (12/13/2021)   Received from [facility], [facility]   HITS    Physically Hurt: Not on file    Insult or Talk Down To: Not on file    Threaten Physical Harm: Not on file    Scream or Curse: Not on file    Outpatient  Medications Prior to Visit  Medication Sig Dispense Refill   dexlansoprazole  (DEXILANT ) 60 MG capsule TAKE 1 CAPSULE(60 MG) BY MOUTH DAILY 90 capsule 3   methocarbamol  (ROBAXIN -750) 750 MG tablet Take 1 tablet (750 mg total) by mouth every 8 (eight) hours as needed for muscle spasms. 45 tablet 2   rosuvastatin  (CRESTOR ) 10 MG tablet Take 1 tablet (10 mg total) by mouth daily. 90 tablet 1   busPIRone  (BUSPAR ) 7.5 MG tablet TAKE 1 TABLET(7.5 MG) BY MOUTH TWICE DAILY 60 tablet 1   diclofenac (VOLTAREN) 75 MG EC tablet Take 75 mg by mouth 2 (two) times daily. (Patient not taking: Reported on 01/07/2024)     dicyclomine  (BENTYL ) 20 MG tablet Take 1 tablet (20 mg total) by mouth 2 (two) times daily. (Patient not taking: Reported on 01/07/2024) 20 tablet 0   ondansetron  (ZOFRAN -ODT) 4 MG disintegrating tablet Take 1 tablet (4 mg total) by mouth every 8 (eight) hours as needed. (Patient not taking: Reported on 01/07/2024) 20 tablet 0   No facility-administered medications prior to visit.    Allergies  Allergen Reactions   Cat Dander Itching   Paroxetine Hcl Other (See Comments)    Can not climax    Review of Systems  Constitutional:  Negative for chills and fever.  HENT:  Positive for sore throat. Negative for congestion.   Respiratory:  Negative for cough and shortness of breath.   Cardiovascular:  Negative for chest pain and palpitations.  Gastrointestinal:  Negative for diarrhea, nausea and vomiting.  Genitourinary:  Negative for dysuria and hematuria.  Musculoskeletal:  Positive for arthralgias (Left hip) and back pain. Negative for neck pain and neck stiffness.  Skin:  Negative for rash.  Neurological:  Positive for headaches. Negative for dizziness and weakness.  Psychiatric/Behavioral:  Positive for sleep disturbance. Negative for agitation and behavioral problems. The patient is nervous/anxious.        Objective:    Physical Exam Vitals reviewed.  Constitutional:      General: He  is not in acute distress.    Appearance: He is not diaphoretic.  HENT:     Head: Normocephalic and atraumatic.     Nose: Congestion present.     Mouth/Throat:     Mouth: Mucous membranes are moist.  Eyes:     General: No scleral icterus.    Extraocular Movements: Extraocular movements intact.  Cardiovascular:     Rate and Rhythm: Normal rate and regular rhythm.     Heart sounds: Normal heart sounds. No murmur heard. Pulmonary:     Breath sounds: Normal breath sounds. No wheezing or rales.  Musculoskeletal:  Cervical back: Neck supple. No tenderness.     Right lower leg: No edema.     Left lower leg: No edema.  Skin:    General: Skin is warm.     Findings: No rash.  Neurological:     General: No focal deficit present.     Mental Status: He is alert and oriented to person, place, and time.     Cranial Nerves: No cranial nerve deficit.     Sensory: No sensory deficit.     Motor: No weakness.  Psychiatric:        Mood and Affect: Mood normal.        Behavior: Behavior normal.     BP 134/89   Pulse (!) 106   Ht 6' (1.829 m)   Wt 205 lb 12.8 oz (93.4 kg)   SpO2 96%   BMI 27.91 kg/m  Wt Readings from Last 3 Encounters:  01/07/24 205 lb 12.8 oz (93.4 kg)  01/02/24 207 lb (93.9 kg)  04/26/23 183 lb (83 kg)        Assessment & Plan:   Problem List Items Addressed This Visit       Respiratory   Acute recurrent frontal sinusitis - Primary   Started empiric  Augmentin  as he has persistent headache Nasal saline spray as needed for congestion Advised to use humidifier and/or vaporizer for nasal congestion Advised to perform sinus rinse      Relevant Medications   amoxicillin -clavulanate (AUGMENTIN ) 875-125 MG tablet     Other   New daily persistent headache   Headache appears to be likely due to acute sinusitis which is more common Currently does not have acute neurologic deficit If persistent unilateral headache after completing treatment, will consider  imaging        Meds ordered this encounter  Medications   amoxicillin -clavulanate (AUGMENTIN ) 875-125 MG tablet    Sig: Take 1 tablet by mouth 2 (two) times daily.    Dispense:  14 tablet    Refill:  0     Sophya Vanblarcom Alyssa Backbone, MD

## 2024-01-07 NOTE — Assessment & Plan Note (Signed)
 Headache appears to be likely due to acute sinusitis which is more common Currently does not have acute neurologic deficit If persistent unilateral headache after completing treatment, will consider imaging

## 2024-01-13 ENCOUNTER — Ambulatory Visit: Payer: Self-pay

## 2024-01-13 ENCOUNTER — Other Ambulatory Visit: Payer: Self-pay | Admitting: Internal Medicine

## 2024-01-13 DIAGNOSIS — G4452 New daily persistent headache (NDPH): Secondary | ICD-10-CM

## 2024-01-13 NOTE — Telephone Encounter (Signed)
Pt informed

## 2024-01-13 NOTE — Telephone Encounter (Signed)
 Copied from CRM [id number]. Topic: Clinical - Red Word Triage >> Jan 13, 2024 12:08 PM Turkey B wrote: [facility] that prompted transfer to Nurse Triage: pt still has severe pain on left sie of head   Chief Complaint: Headache Symptoms: pain mostly on left side of head,  Frequency: over 3 weeks Pertinent Negatives: Patient denies injuries, falls,  Disposition: [] ED /[] Urgent Care (no appt availability in office) / [] Appointment(In office/virtual)/ []  [place] [facility]/ [] Home Care/ [] Refused Recommended Disposition /[] [place] [facility]/ [x]  Follow-up with PCP Additional Notes: Patient called and advised that he came in last week and saw Dr Lydia Sams.  He was given antibiotics but the pain is still there.  Patient denies any injuries or falls.  Dr Lydia Sams noted in the visit 01/07/2024: "Headache appears to be likely due to acute sinusitis which is more common Currently does not have acute neurologic deficit If persistent unilateral headache after completing treatment, will consider imaging"  Patient states that nothing is worse---He didn't want to take up an appointment time if Dr Lydia Sams was just planning on adding on a Head CT for the patient.  Patient did state that if Dr Lydia Sams wants to see him again and reassess him, he is fine with that.  Patient denies any new symptoms or any worsening symptoms.  He just states that the head pain is still there and he only has one day left of the antibiotic, which he feels hasn't helped or changed anything at this time.  Patient is given Care Advice as per protocol and patient is also advised that if anything gets worse to go to the Emergency Room. Patient verbalized understanding.   Reason for Disposition  Headache is a chronic symptom (recurrent or ongoing AND present > 4 weeks)  Answer Assessment - Initial Assessment Questions 1. LOCATION: "Where does it hurt?"      Left side 2. ONSET: "When did the headache start?" (Minutes, hours or days)       Over 3 weeks ago 3. PATTERN: "Does the pain come and go, or has it been constant since it started?"     Comes and goes 4. SEVERITY: "How bad is the pain?" and "What does it keep you from doing?"  (e.g., Scale 1-10; mild, moderate, or severe)   - MILD (1-3): doesn't interfere with normal activities    - MODERATE (4-7): interferes with normal activities or awakens from sleep    - SEVERE (8-10): excruciating pain, unable to do any normal activities        5-8 5. RECURRENT SYMPTOM: "Have you ever had headaches before?" If Yes, ask: "When was the last time?" and "What happened that time?"      No 6. CAUSE: "What do you think is causing the headache?"     Unknown 7. MIGRAINE: "Have you been diagnosed with migraine headaches?" If Yes, ask: "Is this headache similar?"      No 8. HEAD INJURY: "Has there been any recent injury to the head?"      No 9. OTHER SYMPTOMS: "Do you have any other symptoms?" (fever, stiff neck, eye pain, sore throat, cold symptoms)  Protocols used: Headache-A-AH

## 2024-02-03 ENCOUNTER — Ambulatory Visit (HOSPITAL_COMMUNITY)
Admission: RE | Admit: 2024-02-03 | Discharge: 2024-02-03 | Disposition: A | Discharge status: Home / Self Care | Source: Ambulatory Visit | Attending: Internal Medicine | Admitting: Internal Medicine

## 2024-02-03 ENCOUNTER — Encounter (HOSPITAL_COMMUNITY): Payer: Self-pay

## 2024-02-03 DIAGNOSIS — G4452 New daily persistent headache (NDPH): Secondary | ICD-10-CM

## 2024-02-04 ENCOUNTER — Telehealth: Payer: Self-pay

## 2024-02-04 NOTE — Telephone Encounter (Signed)
 Copied from CRM [id number]. Topic: General - Other >> Feb 03, 2024  4:27 PM Donald Frost wrote: Reason for CRM: The patient called concerning his MRI of his sinuses . He called to ask if he could cancel his MRI and before I could ask questions or see if I could get a nurse he hung up the phone. I did try to call him back but he did not answer. He was over at the Imaging Dept at [facility] for his MRI >> Feb 03, 2024  4:31 PM Donald Frost wrote: Looking back I see it was an MRI of the Brain but he said it was for his sinuses

## 2024-03-08 ENCOUNTER — Other Ambulatory Visit: Payer: Self-pay | Admitting: Internal Medicine

## 2024-03-08 DIAGNOSIS — E782 Mixed hyperlipidemia: Secondary | ICD-10-CM

## 2024-03-14 ENCOUNTER — Ambulatory Visit
Admission: EM | Admit: 2024-03-14 | Discharge: 2024-03-14 | Disposition: A | Discharge status: Home / Self Care | Attending: Nurse Practitioner | Admitting: Nurse Practitioner

## 2024-03-14 DIAGNOSIS — U071 COVID-19: Secondary | ICD-10-CM

## 2024-03-14 MED ORDER — PAXLOVID (300/100) 20 X 150 MG & 10 X 100MG PO TBPK: 3.0000 | ORAL_TABLET | Freq: Two times a day (BID) | ORAL | 0 refills | Status: AC

## 2024-03-14 MED ORDER — FLUTICASONE PROPIONATE 50 MCG/ACT NA SUSP: 1.0000 | Freq: Every day | NASAL | 0 refills | Status: AC

## 2024-03-14 MED ORDER — PROMETHAZINE-DM 6.25-15 MG/5ML PO SYRP: 5.0000 mL | ORAL_SOLUTION | Freq: Four times a day (QID) | ORAL | 0 refills | Status: DC | PRN

## 2024-03-14 MED ORDER — ALBUTEROL SULFATE HFA 108 (90 BASE) MCG/ACT IN AERS: 2.0000 | INHALATION_SPRAY | Freq: Four times a day (QID) | RESPIRATORY_TRACT | 0 refills | Status: AC | PRN

## 2024-03-14 NOTE — ED Provider Notes (Signed)
 [facility]    CSN: [id number] Arrival date & time: 03/14/24  1212      History   Chief Complaint No chief complaint on file.   HPI Scott Vanderveer. is a 48 y.o. male.   The history is provided by the patient.   Patient presents with a 2-day history of nasal congestion, cough, headache, and intermittent shortness of breath.  Patient denies fever, chills, ear pain, ear drainage, wheezing, abdominal pain, nausea, vomiting, diarrhea, or rash.  Patient states that he has not taking any medication for his symptoms.  States that he did take a home COVID test this morning which was positive.  Patient is requesting antiviral medication.  Past Medical History:  Diagnosis Date   Anxiety    Chronic   GERD (gastroesophageal reflux disease)    Headache    Neuroma    OCD (obsessive compulsive disorder)     Patient Active Problem List   Diagnosis Date Noted   Acute recurrent frontal sinusitis 01/07/2024   New daily persistent headache 01/07/2024   Other headache syndrome 01/02/2024   ASCVD (arteriosclerotic cardiovascular disease) 08/25/2023   Family history of coronary artery disease 08/06/2023   Skin lesion of right ear 08/06/2023   At risk for coronary artery disease 08/06/2023   Acute non-recurrent maxillary sinusitis 03/26/2023   Situational phobia 01/15/2023   Pre-procedural general physical examination 11/18/2022   Refused influenza vaccine 10/17/2022   Encounter for general adult medical examination with abnormal findings 10/17/2022   Erythrocytosis 10/17/2022   Mixed hyperlipidemia 04/16/2022   Vitamin D  deficiency 04/16/2022   Chronic left-sided low back pain with left-sided sciatica 12/27/2021   Anxiety 08/20/2021   DDD (degenerative disc disease), cervical 04/05/2021   Vertigo 01/12/2021   Erosive esophagitis 08/11/2020   Morton's neuroma of left foot 08/11/2020   Cervical pain (neck) 06/13/2020   Alcohol abuse 04/20/2020   GERD (gastroesophageal  reflux disease) 10/12/2019   GAD (generalized anxiety disorder) 02/05/2019   Nephrolithiasis 01/27/2014   Tobacco use disorder 11/05/2013   OCD (obsessive compulsive disorder)     Past Surgical History:  Procedure Laterality Date   BIOPSY  06/11/2021   Procedure: BIOPSY;  Surgeon: Shaaron Lamar HERO, MD;  [facility];  Service: Endoscopy;;   CIRCUMCISION  2004   ESOPHAGOGASTRODUODENOSCOPY (EGD) WITH PROPOFOL  N/A 08/10/2020   Procedure: ESOPHAGOGASTRODUODENOSCOPY (EGD) WITH PROPOFOL ;  Surgeon: Shaaron Lamar HERO, MD;  [facility];  Service: Endoscopy;  Laterality: N/A;  1:45pm   ESOPHAGOGASTRODUODENOSCOPY (EGD) WITH PROPOFOL  N/A 06/11/2021   Procedure: ESOPHAGOGASTRODUODENOSCOPY (EGD) WITH PROPOFOL ;  Surgeon: Shaaron Lamar HERO, MD;  [facility];  Service: Endoscopy;  Laterality: N/A;  10:30am   EXCISION MORTON'S NEUROMA  10/16/2020   Procedure: EXCISION MORTON'S NEUROMA,AND REMOVAL OF INGROWN NAIL-GREAT TOES LEFT;  Surgeon: Tobie Franky SQUIBB, DPM;  Location: [facility];  Service: Podiatry;;   WISDOM TOOTH EXTRACTION  2011       Home Medications    Prior to Admission medications   Medication Sig Start Date End Date Taking? Authorizing Provider  amoxicillin -clavulanate (AUGMENTIN ) 875-125 MG tablet Take 1 tablet by mouth 2 (two) times daily. 01/07/24   Tobie Suzzane POUR, MD  dexlansoprazole  (DEXILANT ) 60 MG capsule TAKE 1 CAPSULE(60 MG) BY MOUTH DAILY 05/07/23   Patel, Rutwik K, MD  diclofenac (VOLTAREN) 75 MG EC tablet Take 75 mg by mouth 2 (two) times daily. Patient not taking: Reported on 01/07/2024 10/12/22   [provider]  dicyclomine  (BENTYL ) 20 MG tablet Take 1 tablet (20 mg total) by mouth 2 (two) times daily. Patient not taking: Reported on 01/07/2024 04/26/23   Hildegard Loge, PA-C  methocarbamol  (ROBAXIN -750) 750 MG tablet Take 1 tablet (750 mg total) by mouth every 8 (eight) hours as needed for muscle spasms. 01/02/24   Bevely Doffing, FNP   ondansetron  (ZOFRAN -ODT) 4 MG disintegrating tablet Take 1 tablet (4 mg total) by mouth every 8 (eight) hours as needed. Patient not taking: Reported on 01/07/2024 04/26/23   Hildegard Loge, PA-C  rosuvastatin  (CRESTOR ) 10 MG tablet TAKE 1 TABLET(10 MG) BY MOUTH DAILY 03/08/24   Tobie Suzzane POUR, MD  sertraline  (ZOLOFT ) 100 MG tablet 50 mg daily for four days, then 100 mg daily for four days, then 150 mg daily 12/27/20 02/08/21  Vickey Mettle, MD    Family History Family History  Problem Relation Age of Onset   Depression Mother    Anxiety disorder Mother    Colon polyps Father    Colon cancer Father    Hypertension Father    Depression Sister    Alcohol abuse Maternal Uncle    Bipolar disorder Paternal Uncle    Schizophrenia Paternal Uncle    Esophageal cancer Maternal Grandmother    Throat cancer Maternal Grandmother    Rectal cancer Neg Hx    Stomach cancer Neg Hx     Social History Social History   Tobacco Use   Smoking status: Every Day    Current packs/day: 0.50    Average packs/day: 0.5 packs/day for 4.0 years (2.0 ttl pk-yrs)    Types: Cigarettes   Smokeless tobacco: Former    Types: Snuff  Vaping Use   Vaping status: Never Used  Substance Use Topics   Drug use: No     Allergies   Cat dander and Paroxetine hcl   Review of Systems Review of Systems Per HPI  Physical Exam Triage Vital Signs ED Triage Vitals  Encounter Vitals Group     BP 03/14/24 1237 120/86     Girls Systolic BP Percentile --      Girls Diastolic BP Percentile --      Boys Systolic BP Percentile --      Boys Diastolic BP Percentile --      Pulse Rate 03/14/24 1237 80     Resp 03/14/24 1237 18     Temp 03/14/24 1237 98.4 F (36.9 C)     Temp Source 03/14/24 1237 Oral     SpO2 03/14/24 1237 94 %     Weight --      Height --      Head Circumference --      Peak Flow --      Pain Score 03/14/24 1234 0     Pain Loc --      Pain Education --      Exclude from Growth Chart --    No data  found.  Updated Vital Signs BP 120/86 (BP Location: Right Arm)   Pulse 80   Temp 98.4 F (36.9 C) (Oral)   Resp 18   SpO2 94%   Visual Acuity Right Eye Distance:   Left Eye Distance:   Bilateral Distance:    Right Eye Near:   Left Eye Near:    Bilateral Near:     Physical Exam Vitals and nursing note reviewed.  Constitutional:      General: He is not in acute distress.    Appearance: Normal appearance.  HENT:  Head: Normocephalic.     Right Ear: Tympanic membrane, ear canal and external ear normal.     Left Ear: Tympanic membrane, ear canal and external ear normal.     Nose: Congestion present.     Mouth/Throat:     Lips: Pink.     Mouth: Mucous membranes are moist.     Pharynx: Postnasal drip present. No pharyngeal swelling, oropharyngeal exudate, posterior oropharyngeal erythema or uvula swelling.     Comments: Cobblestoning present to posterior oropharynx  Eyes:     Extraocular Movements: Extraocular movements intact.     Conjunctiva/sclera: Conjunctivae normal.     Pupils: Pupils are equal, round, and reactive to light.  Cardiovascular:     Rate and Rhythm: Normal rate and regular rhythm.     Pulses: Normal pulses.     Heart sounds: Normal heart sounds.  Pulmonary:     Effort: Pulmonary effort is normal. No respiratory distress.     Breath sounds: Normal breath sounds. No stridor. No wheezing, rhonchi or rales.  Abdominal:     General: Bowel sounds are normal.     Palpations: Abdomen is soft.     Tenderness: There is no abdominal tenderness.  Musculoskeletal:     Cervical back: Normal range of motion.  Lymphadenopathy:     Cervical: No cervical adenopathy.  Skin:    General: Skin is warm and dry.  Neurological:     General: No focal deficit present.     Mental Status: He is alert and oriented to person, place, and time.  Psychiatric:        Mood and Affect: Mood normal.        Behavior: Behavior normal.      UC Treatments / Results  Labs (all  labs ordered are listed, but only abnormal results are displayed) Labs Reviewed - No data to display  EKG   Radiology No results found.  Procedures Procedures (including critical care time)  Medications Ordered in UC Medications - No data to display  Initial Impression / Assessment and Plan / UC Course  I have reviewed the triage vital signs and the nursing notes.  Pertinent labs & imaging results that were available during my care of the patient were reviewed by me and considered in my medical decision making (see chart for details).  Patient with positive home COVID test.  Will not retest as patient is symptomatic.  Will treat with Paxlovid , Promethazine  DM for his cough, and albuterol  inhaler as needed for intermittent shortness of breath, and fluticasone  50 micro nasal spray for nasal congestion and runny nose.  Supportive care recommendations were provided and discussed with the patient to include fluids, rest, over-the-counter analgesics, use of normal saline nasal spray, and use of a humidifier at nighttime during sleep.  Patient was also given strict ER follow-up precautions.  Patient was in agreement with this plan of care and verbalizes understanding.  All questions were answered.  Patient stable for discharge.  Work note was provided.   Final Clinical Impressions(s) / UC Diagnoses   Final diagnoses:  None   Discharge Instructions   None    ED Prescriptions   None    PDMP not reviewed this encounter.   Gilmer Etta PARAS, NP 03/14/24 1254

## 2024-03-14 NOTE — ED Triage Notes (Signed)
 PT REPORTS HE HAS SOME NASAL CONGESTION, cough, and headache x 2 days    Tested positive for covid at home wants covid meds

## 2024-03-14 NOTE — Discharge Instructions (Signed)
 Your COVID test was positive.  Take medication as prescribed. Increase fluids and allow for plenty of rest. You may take OTC Tylenol  or Ibuprofen  as needed for pain, fever, or general discomfort. Warm salt water  gargles throughout the day to help with sore throat. Normal saline nasal spray for nasal congestion and runny nose.  You may use the nasal spray you have at home to help with the nasal congestion and runny nose. For the cough, recommend using a humidifier in the bedroom at nighttime during sleep and sleeping elevated on pillows while cough symptoms persist. You will need to wear your mask while you are taking the medication.  If you continue to experience symptoms after completing the medication, continue to wear your mask for an additional 5 days. You will need to remain home if you develop a fever.  You can return to your normal activities after you have been fever free for 24 hours with no medication. Go to the emergency department immediately if you experience shortness of breath, difficulty breathing, or other concerns. Follow-up as needed.

## 2024-04-26 ENCOUNTER — Other Ambulatory Visit: Payer: Self-pay | Admitting: Internal Medicine

## 2024-04-26 DIAGNOSIS — R197 Diarrhea, unspecified: Secondary | ICD-10-CM

## 2024-04-26 DIAGNOSIS — K219 Gastro-esophageal reflux disease without esophagitis: Secondary | ICD-10-CM

## 2024-04-30 ENCOUNTER — Encounter: Payer: Self-pay | Admitting: Radiology

## 2024-04-30 ENCOUNTER — Ambulatory Visit: Payer: Self-pay

## 2024-04-30 NOTE — Telephone Encounter (Signed)
 Since this is a new problem, will require an in office appt before any testing can be ordered.

## 2024-04-30 NOTE — Telephone Encounter (Signed)
 Scheduled for Tuesday 08/26
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
## 2024-05-04 ENCOUNTER — Other Ambulatory Visit: Payer: Self-pay | Admitting: Internal Medicine

## 2024-05-04 ENCOUNTER — Ambulatory Visit: Admitting: Nurse Practitioner

## 2024-05-04 DIAGNOSIS — J309 Allergic rhinitis, unspecified: Secondary | ICD-10-CM

## 2024-05-04 MED ORDER — CETIRIZINE HCL 10 MG PO TABS
10.0000 mg | ORAL_TABLET | Freq: Every day | ORAL | 11 refills | Status: AC
Start: 2024-05-04 — End: ?

## 2024-05-12 ENCOUNTER — Telehealth: Payer: Self-pay | Admitting: Internal Medicine

## 2024-05-12 NOTE — Telephone Encounter (Signed)
 Copied from CRM [id number]. Topic: Appointments - Appointment Cancel/Reschedule >> May 12, 2024  2:55 PM Sophia H wrote:   Wanting earlier appt - patient is requesting a call back from office. States has a Merchant navy officer at 1p. Wanting to know if he can do a virtual visit for today or early tomorrow morning. Please reach out and advise # [phone]   Advised nothing on my end till 10/01 with Dr. Tobie.

## 2024-05-13 ENCOUNTER — Encounter: Payer: Self-pay | Admitting: Internal Medicine

## 2024-05-13 ENCOUNTER — Ambulatory Visit (INDEPENDENT_AMBULATORY_CARE_PROVIDER_SITE_OTHER): Admitting: Internal Medicine

## 2024-05-13 VITALS — BP 135/84 | HR 87 | Ht 72.0 in | Wt 202.0 lb

## 2024-05-13 DIAGNOSIS — F40248 Other situational type phobia: Secondary | ICD-10-CM

## 2024-05-13 DIAGNOSIS — F411 Generalized anxiety disorder: Secondary | ICD-10-CM

## 2024-05-13 DIAGNOSIS — F422 Mixed obsessional thoughts and acts: Secondary | ICD-10-CM

## 2024-05-13 DIAGNOSIS — F419 Anxiety disorder, unspecified: Secondary | ICD-10-CM

## 2024-05-13 MED ORDER — BUSPIRONE HCL 7.5 MG PO TABS: 7.5000 mg | ORAL_TABLET | Freq: Two times a day (BID) | ORAL | 3 refills | Status: DC

## 2024-05-13 NOTE — Patient Instructions (Addendum)
 Please start taking Buspirone  as prescribed.  Please continue to take medications as prescribed.  Please continue to follow low salt diet and perform moderate exercise/walking as tolerated.

## 2024-05-13 NOTE — Telephone Encounter (Signed)
 Patient seen.

## 2024-05-14 NOTE — Progress Notes (Signed)
 Acute Office Visit  Subjective:    Patient ID: Christopher Wyatt., male    DOB: 04/26/1976, 48 y.o.   MRN: [id number]  Chief Complaint  Patient presents with   Anxiety    Has panic episodes    HPI Patient is in today for discussion of severe anxiety spells/panic episode, especially while driving at times.  He has reported similar episodes in the past where he would have severe anxiety while passing other vehicle.  He reports that he tries to slow down and gets behind another car rather than going faster and feels better within few seconds.  He gets extra vigilant while driving at times.  He has history of GAD and OCD.  He was placed on Prozac  in the past, but had stopped taking it in the last year as he did not want to take any medicine on a daily basis.  His son was recently hospitalized for seizure episode, which was deemed to be psychogenic seizure after negative EEG.  Patient himself denies any seizure-like activity.  Denies any illicit substance use.  Past Medical History:  Diagnosis Date   Anxiety    Chronic   GERD (gastroesophageal reflux disease)    Headache    Neuroma    OCD (obsessive compulsive disorder)     Past Surgical History:  Procedure Laterality Date   BIOPSY  06/11/2021   Procedure: BIOPSY;  Surgeon: Shaaron Lamar HERO, MD;  [facility];  Service: Endoscopy;;   CIRCUMCISION  2004   ESOPHAGOGASTRODUODENOSCOPY (EGD) WITH PROPOFOL  N/A 08/10/2020   Procedure: ESOPHAGOGASTRODUODENOSCOPY (EGD) WITH PROPOFOL ;  Surgeon: Shaaron Lamar HERO, MD;  Location: [facility];  Service: Endoscopy;  Laterality: N/A;  1:45pm   ESOPHAGOGASTRODUODENOSCOPY (EGD) WITH PROPOFOL  N/A 06/11/2021   Procedure: ESOPHAGOGASTRODUODENOSCOPY (EGD) WITH PROPOFOL ;  Surgeon: Shaaron Lamar HERO, MD;  Location: [facility];  Service: Endoscopy;  Laterality: N/A;  10:30am   EXCISION MORTON'S NEUROMA  10/16/2020   Procedure: EXCISION MORTON'S NEUROMA,AND REMOVAL OF INGROWN NAIL-GREAT TOES LEFT;   Surgeon: Tobie Franky SQUIBB, DPM;  Location: [facility];  Service: Podiatry;;   WISDOM TOOTH EXTRACTION  2011    Family History  Problem Relation Age of Onset   Depression Mother    Anxiety disorder Mother    Colon polyps Father    Colon cancer Father    Hypertension Father    Depression Sister    Alcohol abuse Maternal Uncle    Bipolar disorder Paternal Uncle    Schizophrenia Paternal Uncle    Esophageal cancer Maternal Grandmother    Throat cancer Maternal Grandmother    Rectal cancer Neg Hx    Stomach cancer Neg Hx     Social History   Socioeconomic History   Marital status: Significant Other    Spouse name: Not on file   Number of children: 1   Years of education: 12   Highest education level: High school graduate  Occupational History   Occupation: local truck driver  Tobacco Use   Smoking status: Every Day    Current packs/day: 0.50    Average packs/day: 0.5 packs/day for 4.0 years (2.0 ttl pk-yrs)    Types: Cigarettes   Smokeless tobacco: Former    Types: Snuff  Vaping Use   Vaping status: Never Used  Substance and Sexual Activity   Alcohol use: Not on file    Comment: 1-2 glasses of wine  few x week   Drug use: No   Sexual activity: Yes  Other Topics Concern   Not on file  Social History Narrative   Lives at home, along with his son and parents.   Left-sided.   1-3 cup coffee, occasional soda.   Social Drivers of Corporate investment banker Strain: Not on file  Food Insecurity: Not on file  Transportation Needs: Not on file  Physical Activity: Not on file  Stress: Stress Concern Present (02/01/2022)   [employer] of Occupational Health - Occupational Stress Questionnaire    Feeling of Stress : Rather much  Social Connections: Unknown (01/08/2022)   Received from [facility]   Social Network    Social Network: Not on file  Intimate Partner Violence: Unknown (12/13/2021)   Received from [facility]   HITS    Physically Hurt:  Not on file    Insult or Talk Down To: Not on file    Threaten Physical Harm: Not on file    Scream or Curse: Not on file    Outpatient Medications Prior to Visit  Medication Sig Dispense Refill   albuterol  (VENTOLIN  HFA) 108 (90 Base) MCG/ACT inhaler Inhale 2 puffs into the lungs every 6 (six) hours as needed. 8 g 0   cetirizine  (ZYRTEC ) 10 MG tablet Take 1 tablet (10 mg total) by mouth daily. 30 tablet 11   dexlansoprazole  (DEXILANT ) 60 MG capsule TAKE 1 CAPSULE(60 MG) BY MOUTH DAILY 90 capsule 3   diclofenac (VOLTAREN) 75 MG EC tablet Take 75 mg by mouth 2 (two) times daily.     dicyclomine  (BENTYL ) 20 MG tablet Take 1 tablet (20 mg total) by mouth 2 (two) times daily. 20 tablet 0   fluticasone  (FLONASE ) 50 MCG/ACT nasal spray Place 1 spray into both nostrils daily. 16 g 0   methocarbamol  (ROBAXIN -750) 750 MG tablet Take 1 tablet (750 mg total) by mouth every 8 (eight) hours as needed for muscle spasms. 45 tablet 2   ondansetron  (ZOFRAN -ODT) 4 MG disintegrating tablet Take 1 tablet (4 mg total) by mouth every 8 (eight) hours as needed. 20 tablet 0   rosuvastatin  (CRESTOR ) 10 MG tablet TAKE 1 TABLET(10 MG) BY MOUTH DAILY 90 tablet 1   amoxicillin -clavulanate (AUGMENTIN ) 875-125 MG tablet Take 1 tablet by mouth 2 (two) times daily. 14 tablet 0   promethazine -dextromethorphan (PROMETHAZINE -DM) 6.25-15 MG/5ML syrup Take 5 mLs by mouth 4 (four) times daily as needed. 118 mL 0   No facility-administered medications prior to visit.    Allergies  Allergen Reactions   Cat Dander Itching   Paroxetine Hcl Other (See Comments)    Can not climax    Review of Systems  Constitutional:  Negative for chills and fever.  HENT:  Negative for congestion and sore throat.   Respiratory:  Negative for cough and shortness of breath.   Cardiovascular:  Negative for chest pain and palpitations.  Gastrointestinal:  Negative for diarrhea, nausea and vomiting.  Genitourinary:  Negative for dysuria and  hematuria.  Musculoskeletal:  Positive for arthralgias (Left hip) and back pain. Negative for neck pain and neck stiffness.  Skin:  Negative for rash.  Neurological:  Negative for dizziness and weakness.  Psychiatric/Behavioral:  Positive for sleep disturbance. Negative for agitation and behavioral problems. The patient is nervous/anxious.        Objective:    Physical Exam Vitals reviewed.  Constitutional:      General: He is not in acute distress.    Appearance: He is not diaphoretic.  HENT:     Head: Normocephalic and atraumatic.  Nose: Nose normal.     Mouth/Throat:     Mouth: Mucous membranes are moist.  Eyes:     General: No scleral icterus.    Extraocular Movements: Extraocular movements intact.  Cardiovascular:     Rate and Rhythm: Normal rate and regular rhythm.     Heart sounds: Normal heart sounds. No murmur heard. Pulmonary:     Breath sounds: Normal breath sounds. No wheezing or rales.  Musculoskeletal:     Cervical back: Neck supple. No tenderness.     Right lower leg: No edema.     Left lower leg: No edema.  Skin:    General: Skin is warm.     Findings: No rash.  Neurological:     General: No focal deficit present.     Mental Status: He is alert and oriented to person, place, and time.     Sensory: No sensory deficit.     Motor: No weakness.  Psychiatric:        Mood and Affect: Mood is anxious.        Behavior: Behavior normal.     BP 135/84   Pulse 87   Ht 6' (1.829 m)   Wt 202 lb (91.6 kg)   SpO2 97%   BMI 27.40 kg/m  Wt Readings from Last 3 Encounters:  05/13/24 202 lb (91.6 kg)  01/07/24 205 lb 12.8 oz (93.4 kg)  01/02/24 207 lb (93.9 kg)        Assessment & Plan:   Problem List Items Addressed This Visit       Other   OCD (obsessive compulsive disorder)   Has h/o OCD Did not continue Prozac  due to insomnia Has tried multiple other SSRIs, including Zoloft , Celexa, Paxil -had side effects Used to follow up with psychiatry,  but prefers to avoid seeing psychiatry now      GAD (generalized anxiety disorder) - Primary   Has tried multiple SSRIs in the past, including Zoloft , Celexa and Wellbutrin  -caused drowsiness and feeling spacey; Paxil caused anorgasmia Chart review suggests that he had good response with Prozac  in the past, had started Prozac , but he stopped taking it due to insomnia DCed Effexor  as he did not continue treatment Had started Vistaril  as needed for anxiety spells Avoid BZDs for now Started BuSpar  7.5 mg BID He is willing to try Wellbutrin , but will add later as he has not tolerated Wellbutrin  in the past      Relevant Medications   busPIRone  (BUSPAR ) 7.5 MG tablet   Situational phobia   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    Dispense:  60 tablet    Refill:  3     Reilly Molchan MARLA Blanch, MD

## 2024-05-14 NOTE — Assessment & Plan Note (Signed)
Has tried multiple SSRIs in the past, including Zoloft, Celexa and Wellbutrin -caused drowsiness and feeling spacey; Paxil caused anorgasmia Chart review suggests that he had good response with Prozac in the past, had started Prozac, but he stopped taking it due to insomnia DCed Effexor as he did not continue treatment Had started Vistaril as needed for anxiety spells Avoid BZDs for now Started BuSpar 7.5 mg BID He is willing to try Wellbutrin, but will add later as he has not tolerated Wellbutrin in the past

## 2024-05-14 NOTE — Assessment & Plan Note (Signed)
Has h/o OCD Did not continue Prozac due to insomnia Has tried multiple other SSRIs, including Zoloft, Celexa, Paxil -had side effects Used to follow up with psychiatry, but prefers to avoid seeing psychiatry now

## 2024-05-14 NOTE — Assessment & Plan Note (Signed)
 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

## 2024-07-02 ENCOUNTER — Ambulatory Visit: Payer: Self-pay

## 2024-07-02 ENCOUNTER — Telehealth: Admitting: Family Medicine

## 2024-07-02 DIAGNOSIS — B9689 Other specified bacterial agents as the cause of diseases classified elsewhere: Secondary | ICD-10-CM

## 2024-07-02 DIAGNOSIS — J019 Acute sinusitis, unspecified: Secondary | ICD-10-CM

## 2024-07-02 MED ORDER — AMOXICILLIN-POT CLAVULANATE 875-125 MG PO TABS: 1.0000 | ORAL_TABLET | Freq: Two times a day (BID) | ORAL | 0 refills | Status: DC

## 2024-07-02 NOTE — Progress Notes (Signed)
 Virtual Visit Consent   Christopher A Deines Jr., you are scheduled for a virtual visit with a [facility] provider today. Just as with appointments in the office, your consent must be obtained to participate. Your consent will be active for this visit and any virtual visit you may have with one of our providers in the next 365 days. If you have a MyChart account, a copy of this consent can be sent to you electronically.  As this is a virtual visit, video technology does not allow for your provider to perform a traditional examination. This may limit your provider's ability to fully assess your condition. If your provider identifies any concerns that need to be evaluated in person or the need to arrange testing (such as labs, EKG, etc.), we will make arrangements to do so. Although advances in technology are sophisticated, we cannot ensure that it will always work on either your end or our end. If the connection with a video visit is poor, the visit may have to be switched to a telephone visit. With either a video or telephone visit, we are not always able to ensure that we have a secure connection.  By engaging in this virtual visit, you consent to the provision of healthcare and authorize for your insurance to be billed (if applicable) for the services provided during this visit. Depending on your insurance coverage, you may receive a charge related to this service.  I need to obtain your verbal consent now. Are you willing to proceed with your visit today? Christopher Wyatt. has provided verbal consent on 07/02/2024 for a virtual visit (video or telephone). Christopher Lamp, FNP  Date: 07/02/2024 7:24 PM   Virtual Visit via Video Note   I, Christopher Wyatt, connected with  Christopher Wyatt.  ([id number], Mar 03, 1976) on 07/02/24 at  7:15 PM EDT by a video-enabled telemedicine application and verified that I am speaking with the correct person using two identifiers.  Location: Patient: Virtual Visit  Location Patient: Home Provider: Virtual Visit Location Provider: Home Office   I discussed the limitations of evaluation and management by telemedicine and the availability of in person appointments. The patient expressed understanding and agreed to proceed.    History of Present Illness: Christopher Wyszynski. is a 48 y.o. who identifies as a male who was assigned male at birth, and is being seen today for seen at urgent care 2 weeks ago and they sent prednisone  and zpack.His rt maxillary sinus never cleared. He has pain in rt maxillary sinus and green mucus. Teeth on that side hurt.   HPI: HPI  Problems:  Patient Active Problem List   Diagnosis Date Noted   Acute recurrent frontal sinusitis 01/07/2024   New daily persistent headache 01/07/2024   Other headache syndrome 01/02/2024   ASCVD (arteriosclerotic cardiovascular disease) 08/25/2023   Family history of coronary artery disease 08/06/2023   Skin lesion of right ear 08/06/2023   At risk for coronary artery disease 08/06/2023   Acute non-recurrent maxillary sinusitis 03/26/2023   Situational phobia 01/15/2023   Pre-procedural general physical examination 11/18/2022   Refused influenza vaccine 10/17/2022   Encounter for general adult medical examination with abnormal findings 10/17/2022   Erythrocytosis 10/17/2022   Mixed hyperlipidemia 04/16/2022   Vitamin D  deficiency 04/16/2022   Chronic left-sided low back pain with left-sided sciatica 12/27/2021   DDD (degenerative disc disease), cervical 04/05/2021   Vertigo 01/12/2021   Erosive esophagitis 08/11/2020   Morton's neuroma of left foot  08/11/2020   Cervical pain (neck) 06/13/2020   Alcohol abuse 04/20/2020   GERD (gastroesophageal reflux disease) 10/12/2019   GAD (generalized anxiety disorder) 02/05/2019   Nephrolithiasis 01/27/2014   Tobacco use disorder 11/05/2013   OCD (obsessive compulsive disorder)     Allergies:  Allergies  Allergen Reactions   Cat Dander  Itching   Paroxetine Hcl Other (See Comments)    Can not climax   Medications:  Current Outpatient Medications:    amoxicillin -clavulanate (AUGMENTIN ) 875-125 MG tablet, Take 1 tablet by mouth 2 (two) times daily., Disp: 20 tablet, Rfl: 0   albuterol  (VENTOLIN  HFA) 108 (90 Base) MCG/ACT inhaler, Inhale 2 puffs into the lungs every 6 (six) hours as needed., Disp: 8 g, Rfl: 0   busPIRone  (BUSPAR ) 7.5 MG tablet, Take 1 tablet (7.5 mg total) by mouth 2 (two) times daily., Disp: 60 tablet, Rfl: 3   cetirizine  (ZYRTEC ) 10 MG tablet, Take 1 tablet (10 mg total) by mouth daily., Disp: 30 tablet, Rfl: 11   dexlansoprazole  (DEXILANT ) 60 MG capsule, TAKE 1 CAPSULE(60 MG) BY MOUTH DAILY, Disp: 90 capsule, Rfl: 3   diclofenac (VOLTAREN) 75 MG EC tablet, Take 75 mg by mouth 2 (two) times daily., Disp: , Rfl:    dicyclomine  (BENTYL ) 20 MG tablet, Take 1 tablet (20 mg total) by mouth 2 (two) times daily., Disp: 20 tablet, Rfl: 0   fluticasone  (FLONASE ) 50 MCG/ACT nasal spray, Place 1 spray into both nostrils daily., Disp: 16 g, Rfl: 0   methocarbamol  (ROBAXIN -750) 750 MG tablet, Take 1 tablet (750 mg total) by mouth every 8 (eight) hours as needed for muscle spasms., Disp: 45 tablet, Rfl: 2   ondansetron  (ZOFRAN -ODT) 4 MG disintegrating tablet, Take 1 tablet (4 mg total) by mouth every 8 (eight) hours as needed., Disp: 20 tablet, Rfl: 0   rosuvastatin  (CRESTOR ) 10 MG tablet, TAKE 1 TABLET(10 MG) BY MOUTH DAILY, Disp: 90 tablet, Rfl: 1  Observations/Objective: Patient is well-developed, well-nourished in no acute distress.  Resting comfortably  at home.  Head is normocephalic, atraumatic.  No labored breathing.  Speech is clear and coherent with logical content.  Patient is alert and oriented at baseline.    Assessment and Plan: 1. Acute bacterial sinusitis (Primary)  Increase fluids, humidifier at night, flonase , UC as needed.   Follow Up Instructions: I discussed the assessment and treatment plan  with the patient. The patient was provided an opportunity to ask questions and all were answered. The patient agreed with the plan and demonstrated an understanding of the instructions.  A copy of instructions were sent to the patient via MyChart unless otherwise noted below.     The patient was advised to call back or seek an in-person evaluation if the symptoms worsen or if the condition fails to improve as anticipated.    Amrit Cress, FNP

## 2024-07-02 NOTE — Patient Instructions (Signed)

## 2024-07-02 NOTE — Telephone Encounter (Signed)
 FYI Only or Action Required?: FYI only for provider.  Patient was last seen in primary care on 05/13/2024 by Tobie Suzzane POUR, MD.  Called Nurse Triage reporting URI.  Symptoms began several weeks ago.  Interventions attempted: Prescription medications: Z pack.  Symptoms are: gradually worsening.  Triage Disposition: See PCP When Office is Open (Within 3 Days)  Patient/caregiver understands and will follow disposition?: Yes Reason for Disposition  [1] Sinus congestion (pressure, fullness) AND [2] present > 10 days  Answer Assessment - Initial Assessment Questions Z Pack from UC 1 1/2 weeks ago d/t bronchitis. Refused prednisone  at that time d/t c/o side effects. Continuing to experience sinus pressure with green discharge. Negative home covid test. Has productive cough.  1. LOCATION: Where does it hurt?      Face  2. ONSET: When did the sinus pain start?  (e.g., hours, days)      1 1/2  3. SEVERITY: How bad is the pain?   (Scale 0-10; or none, mild, moderate or severe)     Severe  4. RECURRENT SYMPTOM: Have you ever had sinus problems before? If Yes, ask: When was the last time? and What happened that time?      Yes, tx by UC  5. NASAL CONGESTION:     Green discharge  6. FEVER: Do you have a fever? If Yes, ask: What is it, how was it measured, and when did it start?      Denies  7. OTHER SYMPTOMS: Do you have any other symptoms? (e.g., sore throat, cough, earache, difficulty breathing)     Denies but does hear wheezing during deep cough  Protocols used: Sinus Pain or Congestion-A-AH   Copied from CRM #[id number]. Topic: Clinical - Red Word Triage >> Jul 02, 2024  3:53 PM Fonda T wrote: Red Word that prompted transfer to Nurse Triage: Patient calling, states he is having symptoms of sinus infection, states he is certain he has an infection.  Head pressure, green mucous, and worsening cough, coughing up and blowing colored mucous.   Patient would like to  have a virtual appointment for evaluation as soon as possible, today if possible so medication be prescribed.

## 2024-07-12 ENCOUNTER — Encounter: Payer: Self-pay | Admitting: Radiology

## 2024-08-09 ENCOUNTER — Other Ambulatory Visit (HOSPITAL_COMMUNITY): Payer: Self-pay

## 2024-08-09 ENCOUNTER — Telehealth: Payer: Self-pay

## 2024-08-09 ENCOUNTER — Telehealth: Payer: Self-pay | Admitting: Pharmacy Technician

## 2024-08-09 NOTE — Telephone Encounter (Signed)
 Prior shara is pending as of 08/09/24

## 2024-08-09 NOTE — Telephone Encounter (Signed)
 Copied from CRM #[id number]. Topic: Clinical - Medication Prior Auth >> Aug 09, 2024 12:53 PM Lauren C wrote: Reason for CRM: Pt states he is out of his dexlansoprazole  (DEXILANT ) 60 MG capsule completely and pharmacy requested a prior auth for this.  [facility] [address], [place] - [address] [place] [address] Phone: [phone] Fax: [phone]  Please call patient once complete.

## 2024-08-09 NOTE — Telephone Encounter (Signed)
 PA request has been Started. New Encounter has been or will be created for follow up. For additional info see Pharmacy Prior Auth telephone encounter from 08/09/2024.

## 2024-08-09 NOTE — Telephone Encounter (Signed)
 Pharmacy Patient Advocate Encounter   Received notification from Pt Calls Messages that prior authorization for Dexlansoprazole  60MG  dr capsules is required/requested.   Insurance verification completed.   The patient is insured through [facility] MEDICAID.   Per test claim: PA required; PA submitted to above mentioned insurance via Latent Key/confirmation #/EOC [id number] Status is pending

## 2024-08-09 NOTE — Telephone Encounter (Unsigned)
 Copied from CRM #[id number]. Topic: Clinical - Medication Prior Auth >> Aug 09, 2024  4:06 PM Wess RAMAN wrote: Reason for CRM: Patient stated his insurance company, [employer], said his PCP needs to submit urgent or emergency prior authorization for dexlansoprazole  (DEXILANT ) 60 MG capsule  Callback #: [id number]  Pharmacy: [facility] - [place], [place] - [address] [place] [address] Phone: [phone] Fax: [phone] Hours: Not open 24 hours

## 2024-08-10 ENCOUNTER — Other Ambulatory Visit (HOSPITAL_COMMUNITY): Payer: Self-pay

## 2024-08-10 NOTE — Telephone Encounter (Signed)
 Pt has been informed.

## 2024-08-10 NOTE — Telephone Encounter (Signed)
 Pharmacy Patient Advocate Encounter  Received notification from [facility] MEDICAID that Prior Authorization for Dexlansoprazole  60MG  dr capsules has been APPROVED from 08/09/2024 to 08/09/2025. Ran test claim, Copay is $4.00. This test claim was processed through [facility]- copay amounts may vary at other pharmacies due to pharmacy/plan contracts, or as the patient moves through the different stages of their insurance plan.   PA #/Case ID/Reference #: [id number]

## 2024-08-11 ENCOUNTER — Telehealth: Payer: Self-pay

## 2024-08-11 NOTE — Telephone Encounter (Signed)
 Copied from CRM [id number]. Topic: Clinical - Request for Lab/Test Order >> Aug 11, 2024 12:40 PM Christopher Wyatt wrote: Reason for CRM: Pt is requesting a full lab panel so check for fatty liver, cancer, other conditions that can be tested for. Please reach out to pt on [phone]

## 2024-08-12 ENCOUNTER — Ambulatory Visit: Admitting: Internal Medicine

## 2024-08-12 NOTE — Telephone Encounter (Signed)
 Patient aware

## 2024-08-20 ENCOUNTER — Ambulatory Visit: Admitting: Internal Medicine

## 2024-09-07 ENCOUNTER — Ambulatory Visit: Payer: Self-pay | Admitting: Internal Medicine

## 2024-09-07 ENCOUNTER — Encounter: Payer: Self-pay | Admitting: Internal Medicine

## 2024-09-07 ENCOUNTER — Telehealth: Admitting: Internal Medicine

## 2024-09-07 DIAGNOSIS — R739 Hyperglycemia, unspecified: Secondary | ICD-10-CM | POA: Diagnosis not present

## 2024-09-07 DIAGNOSIS — F422 Mixed obsessional thoughts and acts: Secondary | ICD-10-CM | POA: Diagnosis not present

## 2024-09-07 DIAGNOSIS — F411 Generalized anxiety disorder: Secondary | ICD-10-CM

## 2024-09-07 DIAGNOSIS — E782 Mixed hyperlipidemia: Secondary | ICD-10-CM

## 2024-09-07 DIAGNOSIS — R569 Unspecified convulsions: Secondary | ICD-10-CM | POA: Diagnosis not present

## 2024-09-07 DIAGNOSIS — E559 Vitamin D deficiency, unspecified: Secondary | ICD-10-CM | POA: Diagnosis not present

## 2024-09-07 DIAGNOSIS — F1010 Alcohol abuse, uncomplicated: Secondary | ICD-10-CM

## 2024-09-07 DIAGNOSIS — Z1159 Encounter for screening for other viral diseases: Secondary | ICD-10-CM

## 2024-09-07 MED ORDER — FLUOXETINE HCL 10 MG PO CAPS: 10.0000 mg | ORAL_CAPSULE | Freq: Every day | ORAL | 3 refills | Status: AC

## 2024-09-07 MED ORDER — BUSPIRONE HCL 15 MG PO TABS: 15.0000 mg | ORAL_TABLET | Freq: Two times a day (BID) | ORAL | 3 refills | Status: AC

## 2024-09-07 NOTE — Assessment & Plan Note (Addendum)
 Takes about 2 alcoholic drinks daily, advised to avoid daily intake Check CMP

## 2024-09-07 NOTE — Progress Notes (Signed)
 "    Virtual Visit via Video Note   Because of Christopher A Leeb Jr.'s co-morbid illnesses, he is at least at moderate risk for complications without adequate follow up.  This format is felt to be most appropriate for this patient at this time.  All issues noted in this document were discussed and addressed.  A limited physical exam was performed with this format.      Evaluation Performed:  Follow-up visit  Date:  09/07/2024   ID:  Christopher Serpe., DOB 05/02/1976, MRN [id number]  Patient Location: Home Provider Location: Office/Clinic  Participants: Patient Location of Patient: Home Location of Provider: Telehealth Consent was obtain for visit to be over via telehealth. I verified that I am speaking with the correct person using two identifiers.  PCP:  Tobie Suzzane POUR, MD   Chief Complaint: Follow-up of chronic medical conditions  History of Present Illness:    Christopher Commons. is a 48 y.o. male with PMH of MDD, GAD, tobacco abuse and alcohol abuse who has a video visit for f/u of his chronic medical conditions.  GAD: He has severe anxiety spells/panic episode, especially while driving at times. He has reported similar episodes in the past where he would have severe anxiety while passing other vehicle. He reports that he tries to slow down and gets behind another car rather than going faster and feels better within few seconds. He gets extra vigilant while driving at times. He has history of GAD and OCD. He was placed on Prozac  in the past, but had stopped taking it in the last year as he did not want to take any medicine on a daily basis.  He tried taking buspirone , but did not help much.  He also reports insomnia, difficulty maintaining sleep.  He has tried melatonin without much relief.  Denies any illicit substance use.  He also requests routine blood tests evaluation.  The patient does not have symptoms concerning for COVID-19 infection (fever, chills, cough, or new  shortness of breath).   Past Medical, Surgical, Social History, Allergies, and Medications have been Reviewed.  Past Medical History:  Diagnosis Date   Anxiety    Chronic   GERD (gastroesophageal reflux disease)    Headache    Neuroma    OCD (obsessive compulsive disorder)    Past Surgical History:  Procedure Laterality Date   BIOPSY  06/11/2021   Procedure: BIOPSY;  Surgeon: Shaaron Lamar HERO, MD;  [facility];  Service: Endoscopy;;   CIRCUMCISION  2004   ESOPHAGOGASTRODUODENOSCOPY (EGD) WITH PROPOFOL  N/A 08/10/2020   Procedure: ESOPHAGOGASTRODUODENOSCOPY (EGD) WITH PROPOFOL ;  Surgeon: Shaaron Lamar HERO, MD;  [facility];  Service: Endoscopy;  Laterality: N/A;  1:45pm   ESOPHAGOGASTRODUODENOSCOPY (EGD) WITH PROPOFOL  N/A 06/11/2021   Procedure: ESOPHAGOGASTRODUODENOSCOPY (EGD) WITH PROPOFOL ;  Surgeon: Shaaron Lamar HERO, MD;  [facility];  Service: Endoscopy;  Laterality: N/A;  10:30am   EXCISION MORTON'S NEUROMA  10/16/2020   Procedure: EXCISION MORTON'S NEUROMA,AND REMOVAL OF INGROWN NAIL-GREAT TOES LEFT;  Surgeon: Tobie Franky SQUIBB, DPM;  Location: [facility];  Service: Podiatry;;   WISDOM TOOTH EXTRACTION  2011     Active Medications[1]   Allergies:   Cat dander and Paroxetine hcl   ROS:   Please see the history of present illness. All other systems reviewed and are negative.   Labs/Other Tests and Data Reviewed:    Recent Labs: No results found for requested labs within last 365  days.   Recent Lipid Panel Lab Results  Component Value Date/Time   CHOL 188 04/12/2022 09:50 AM   TRIG 114 04/12/2022 09:50 AM   HDL 50 04/12/2022 09:50 AM   CHOLHDL 3.8 04/12/2022 09:50 AM   CHOLHDL 2.2 03/26/2016 04:50 PM   LDLCALC 118 (H) 04/12/2022 09:50 AM    Wt Readings from Last 3 Encounters:  05/13/24 202 lb (91.6 kg)  01/07/24 205 lb 12.8 oz (93.4 kg)  01/02/24 207 lb (93.9 kg)     Objective:    Vital Signs:  There were no vitals  taken for this visit.   VITAL SIGNS:  reviewed GEN:  no acute distress EYES:  sclerae anicteric, EOMI - Extraocular Movements Intact RESPIRATORY:  normal respiratory effort, symmetric expansion NEURO:  alert and oriented x 3, no obvious focal deficit PSYCH:  Anxious mood.  ASSESSMENT & PLAN:    Assessment & Plan GAD (generalized anxiety disorder) Has tried multiple SSRIs in the past, including Zoloft , Celexa and Wellbutrin  -caused drowsiness and feeling spacey; Paxil caused anorgasmia Chart review suggests that he had good response with Prozac  in the past, had started Prozac , but he stopped taking it due to insomnia previously - but agrees to try Prozac  again [facility] Effexor  as he did not continue treatment Had started Vistaril  as needed for anxiety spells Avoid BZDs for now Increased BuSpar  to 10 mg BID Mixed obsessional thoughts and acts Has h/o OCD Start Prozac  10 mg QD Has tried multiple other SSRIs, including Zoloft , Celexa, Paxil -had side effects Used to follow up with psychiatry, but prefers to avoid seeing psychiatry now Alcohol abuse Takes about 2 alcoholic drinks daily, advised to avoid daily intake Check CMP Mixed hyperlipidemia Check lipid profile Advised to follow low cholesterol diet for now Vitamin D  deficiency Advised to take Vitamin D  1000 IU QD Hyperglycemia Last CMP showed hyperglycemia Needs to cut back alcoholic beverages and soft drinks Check CMP and HbA1c Need for hepatitis C screening test Check hepatitis C antibody    I discussed the assessment and treatment plan with the patient. The patient was provided an opportunity to ask questions, and all were answered. The patient agreed with the plan and demonstrated an understanding of the instructions.   The patient was advised to call back or seek an in-person evaluation if the symptoms worsen or if the condition fails to improve as anticipated.  The above assessment and management plan was discussed  with the patient. The patient verbalized understanding of and has agreed to the management plan.   Medication Adjustments/Labs and Tests Ordered: Current medicines are reviewed at length with the patient today.  Concerns regarding medicines are outlined above.   Tests Ordered: No orders of the defined types were placed in this encounter.   Medication Changes: No orders of the defined types were placed in this encounter.    Note: This dictation was prepared with Dragon dictation along with smaller phrase technology. Similar sounding words can be transcribed inadequately or may not be corrected upon review. Any transcriptional errors that result from this process are unintentional.      Disposition:  Follow up  Signed, Suzzane MARLA Blanch, MD  09/07/2024 3:34 PM     [facility]     [1]  Current Meds  Medication Sig   albuterol  (VENTOLIN  HFA) 108 (90 Base) MCG/ACT inhaler Inhale 2 puffs into the lungs every 6 (six) hours as needed.   amoxicillin -clavulanate (AUGMENTIN ) 875-125 MG tablet Take 1  tablet by mouth 2 (two) times daily.   busPIRone  (BUSPAR ) 7.5 MG tablet Take 1 tablet (7.5 mg total) by mouth 2 (two) times daily.   cetirizine  (ZYRTEC ) 10 MG tablet Take 1 tablet (10 mg total) by mouth daily.   dexlansoprazole  (DEXILANT ) 60 MG capsule TAKE 1 CAPSULE(60 MG) BY MOUTH DAILY   diclofenac (VOLTAREN) 75 MG EC tablet Take 75 mg by mouth 2 (two) times daily.   dicyclomine  (BENTYL ) 20 MG tablet Take 1 tablet (20 mg total) by mouth 2 (two) times daily.   fluticasone  (FLONASE ) 50 MCG/ACT nasal spray Place 1 spray into both nostrils daily.   methocarbamol  (ROBAXIN -750) 750 MG tablet Take 1 tablet (750 mg total) by mouth every 8 (eight) hours as needed for muscle spasms.   ondansetron  (ZOFRAN -ODT) 4 MG disintegrating tablet Take 1 tablet (4 mg total) by mouth every 8 (eight) hours as needed.   rosuvastatin  (CRESTOR ) 10 MG tablet TAKE 1 TABLET(10 MG) BY  MOUTH DAILY   "

## 2024-09-07 NOTE — Assessment & Plan Note (Addendum)
 Has tried multiple SSRIs in the past, including Zoloft , Celexa and Wellbutrin  -caused drowsiness and feeling spacey; Paxil caused anorgasmia Chart review suggests that he had good response with Prozac  in the past, had started Prozac , but he stopped taking it due to insomnia previously - but agrees to try Prozac  again DCed Effexor  as he did not continue treatment Had started Vistaril  as needed for anxiety spells Avoid BZDs for now Increased BuSpar  to 10 mg BID

## 2024-09-07 NOTE — Assessment & Plan Note (Addendum)
Advised to take Vitamin D 1000 IU QD

## 2024-09-07 NOTE — Assessment & Plan Note (Addendum)
 Has h/o OCD Start Prozac  10 mg QD Has tried multiple other SSRIs, including Zoloft , Celexa, Paxil -had side effects Used to follow up with psychiatry, but prefers to avoid seeing psychiatry now

## 2024-09-07 NOTE — Patient Instructions (Signed)
 Please start taking Fluoxetine  10 mg once daily.  Please take Buspirone  15 mg up to twice daily.  Please get fasting blood tests done within a week.

## 2024-09-07 NOTE — Assessment & Plan Note (Addendum)
Check lipid profile Advised to follow low cholesterol diet for now 

## 2024-09-08 ENCOUNTER — Telehealth: Payer: Self-pay

## 2024-09-08 NOTE — Telephone Encounter (Signed)
 Copied from CRM #[id number]. Topic: Clinical - Request for Lab/Test Order >> Sep 08, 2024 11:24 AM Antony RAMAN wrote: Reason for CRM: requesting an EEG , bright lights will put him in a different state sometimes he said. His son has starting having seizures and wondering if its genetic. [place] [employer]

## 2024-09-10 ENCOUNTER — Encounter: Payer: Self-pay | Admitting: Neurology

## 2024-09-10 DIAGNOSIS — R569 Unspecified convulsions: Secondary | ICD-10-CM | POA: Insufficient documentation

## 2024-09-10 NOTE — Addendum Note (Signed)
 Addended byBETHA TOBIE DOWNS on: 09/10/2024 08:55 AM   Modules accepted: Orders

## 2024-09-10 NOTE — Telephone Encounter (Signed)
 Pt informed

## 2024-09-10 NOTE — Assessment & Plan Note (Addendum)
 Reports taking movements and confusion after bright light exposure Referred to neurology for further evaluation

## 2024-09-11 LAB — TSH: TSH: 1.79 u[IU]/mL (ref 0.450–4.500)

## 2024-09-11 LAB — CMP14+EGFR
ALT: 41 IU/L (ref 0–44)
AST: 91 IU/L — ABNORMAL HIGH (ref 0–40)
Albumin: 4.6 g/dL (ref 4.1–5.1)
Alkaline Phosphatase: 124 IU/L — ABNORMAL HIGH (ref 47–123)
BUN/Creatinine Ratio: 5 — ABNORMAL LOW (ref 9–20)
BUN: 5 mg/dL — ABNORMAL LOW (ref 6–24)
Bilirubin Total: 1.3 mg/dL — ABNORMAL HIGH (ref 0.0–1.2)
CO2: 26 mmol/L (ref 20–29)
Calcium: 10.2 mg/dL (ref 8.7–10.2)
Chloride: 98 mmol/L (ref 96–106)
Creatinine, Ser: 0.92 mg/dL (ref 0.76–1.27)
Globulin, Total: 2.9 g/dL (ref 1.5–4.5)
Glucose: 84 mg/dL (ref 70–99)
Potassium: 4.9 mmol/L (ref 3.5–5.2)
Sodium: 142 mmol/L (ref 134–144)
Total Protein: 7.5 g/dL (ref 6.0–8.5)
eGFR: 103 mL/min/1.73

## 2024-09-11 LAB — CBC WITH DIFFERENTIAL/PLATELET
Basophils Absolute: 0.1 x10E3/uL (ref 0.0–0.2)
Basos: 2 %
EOS (ABSOLUTE): 0.1 x10E3/uL (ref 0.0–0.4)
Eos: 2 %
Hematocrit: 57.9 % — ABNORMAL HIGH (ref 37.5–51.0)
Hemoglobin: 19.1 g/dL — ABNORMAL HIGH (ref 13.0–17.7)
Immature Grans (Abs): 0 x10E3/uL (ref 0.0–0.1)
Immature Granulocytes: 0 %
Lymphocytes Absolute: 2.5 x10E3/uL (ref 0.7–3.1)
Lymphs: 33 %
MCH: 30.3 pg (ref 26.6–33.0)
MCHC: 33 g/dL (ref 31.5–35.7)
MCV: 92 fL (ref 79–97)
Monocytes Absolute: 0.8 x10E3/uL (ref 0.1–0.9)
Monocytes: 10 %
Neutrophils Absolute: 4.1 x10E3/uL (ref 1.4–7.0)
Neutrophils: 53 %
Platelets: 237 x10E3/uL (ref 150–450)
RBC: 6.31 x10E6/uL — ABNORMAL HIGH (ref 4.14–5.80)
RDW: 13.1 % (ref 11.6–15.4)
WBC: 7.6 x10E3/uL (ref 3.4–10.8)

## 2024-09-11 LAB — HEPATITIS C ANTIBODY: Hep C Virus Ab: NONREACTIVE

## 2024-09-11 LAB — LIPID PANEL
Chol/HDL Ratio: 4.9 ratio (ref 0.0–5.0)
Cholesterol, Total: 216 mg/dL — ABNORMAL HIGH (ref 100–199)
HDL: 44 mg/dL
LDL Chol Calc (NIH): 140 mg/dL — ABNORMAL HIGH (ref 0–99)
Triglycerides: 179 mg/dL — ABNORMAL HIGH (ref 0–149)
VLDL Cholesterol Cal: 32 mg/dL (ref 5–40)

## 2024-09-11 LAB — VITAMIN D 25 HYDROXY (VIT D DEFICIENCY, FRACTURES): Vit D, 25-Hydroxy: 6.3 ng/mL — ABNORMAL LOW (ref 30.0–100.0)

## 2024-09-11 LAB — HEMOGLOBIN A1C
Est. average glucose Bld gHb Est-mCnc: 108 mg/dL
Hgb A1c MFr Bld: 5.4 % (ref 4.8–5.6)

## 2024-09-13 ENCOUNTER — Other Ambulatory Visit: Payer: Self-pay | Admitting: Internal Medicine

## 2024-09-13 ENCOUNTER — Ambulatory Visit: Payer: Self-pay | Admitting: Internal Medicine

## 2024-09-13 DIAGNOSIS — D751 Secondary polycythemia: Secondary | ICD-10-CM

## 2024-09-13 DIAGNOSIS — E559 Vitamin D deficiency, unspecified: Secondary | ICD-10-CM

## 2024-09-13 MED ORDER — VITAMIN D (ERGOCALCIFEROL) 1.25 MG (50000 UNIT) PO CAPS: 50000.0000 [IU] | ORAL_CAPSULE | ORAL | 1 refills | Status: AC

## 2024-09-13 NOTE — Addendum Note (Signed)
 Addended byBETHA TOBIE DOWNS on: 09/13/2024 08:12 AM   Modules accepted: Orders

## 2024-09-20 ENCOUNTER — Ambulatory Visit: Payer: Self-pay | Admitting: Neurology

## 2024-09-20 ENCOUNTER — Encounter: Payer: Self-pay | Admitting: Neurology

## 2024-09-20 VITALS — BP 116/83 | HR 106 | Ht 72.0 in | Wt 199.2 lb

## 2024-09-20 DIAGNOSIS — F411 Generalized anxiety disorder: Secondary | ICD-10-CM | POA: Diagnosis not present

## 2024-09-20 NOTE — Patient Instructions (Signed)
 It's good to see you. Your neurological exam is normal. Please continue working with Dr. Tobie on the anxiety. I would recommend seeing a therapist as well. Follow-up as needed, call for any changes.

## 2024-09-20 NOTE — Progress Notes (Unsigned)
 "  NEUROLOGY CONSULTATION NOTE  Christopher Wyatt. MRN: [id number] DOB: May 14, 1976  Referring provider: Dr. Suzzane Blanch Primary care provider: Dr. Suzzane Blanch  Reason for consult:  seizure-like activity  Dear Dr Blanch:  Thank you for your kind referral of Christopher Wyatt. for consultation of the above symptoms. Although his history is well known to you, please allow me to reiterate it for the purpose of our medical record. He is alone in the office today. Records and images were personally reviewed where available.  Discussed the use of AI scribe software for clinical note transcription with the patient, who gave verbal consent to proceed.  History of Present Illness This is a pleasant 49 year old left-handed man with a history of hyperlipidemia, anxiety, presenting for evaluation of seizure-like activity. He reports that since age 49-15 he has had episodes where he starts feeling very overwhelmed like static electricity going through the sides of his head, his chest and body tightens up, he feels like he is having a severe panic attack. These usually occur when he is driving on the highway and merges on a different lane, worse when there are other people in the car. He would go back to his prior lane and feels better within a few seconds. He gets extra vigilant when driving and is not comfortable with being a passenger, he gets so anxious. He notices more symptoms when he is severely stressed out or at night with the headlights on. He reports an incident when he woke up 2 days ago and saw light, he felt his chest going fast, felt in his head. He closed his eyes and felt better 5-10 seconds later. He denies any staring/unresponsive episodes, gaps in time, focal numbness/tingling/weakness, myoclonic jerks. He starts a new job in 2 weeks. He reports debilitating anxiety, which has impacted his job decisions. He recently declined a higher-paying job due to the requirement of riding as a  passenger during training, which he could not tolerate due to his anxiety. He was started on Prozac  by PCP 2 weeks ago.   He denies any headaches, dizziness, vision changes, no falls. He gets 5 to 7.5 hours of sleep on weekdays, more on weekends. He drinks a glass of wine every other night to relax. He reports his body just drags despite getting 9 hours of sleep. His mother has a history of severe anxiety. His son has seizures and he is concerned he has similar symptoms. He had a normal birth and early development.  There is no history of febrile convulsions, CNS infections such as meningitis/encephalitis, significant traumatic brain injury, neurosurgical procedures.    PAST MEDICAL HISTORY: Past Medical History:  Diagnosis Date   Anxiety    Chronic   GERD (gastroesophageal reflux disease)    Headache    Neuroma    OCD (obsessive compulsive disorder)     PAST SURGICAL HISTORY: Past Surgical History:  Procedure Laterality Date   BIOPSY  06/11/2021   Procedure: BIOPSY;  Surgeon: Shaaron Lamar HERO, MD;  [facility];  Service: Endoscopy;;   CIRCUMCISION  2004   ESOPHAGOGASTRODUODENOSCOPY (EGD) WITH PROPOFOL  N/A 08/10/2020   Procedure: ESOPHAGOGASTRODUODENOSCOPY (EGD) WITH PROPOFOL ;  Surgeon: Shaaron Lamar HERO, MD;  [facility];  Service: Endoscopy;  Laterality: N/A;  1:45pm   ESOPHAGOGASTRODUODENOSCOPY (EGD) WITH PROPOFOL  N/A 06/11/2021   Procedure: ESOPHAGOGASTRODUODENOSCOPY (EGD) WITH PROPOFOL ;  Surgeon: Shaaron Lamar HERO, MD;  [facility];  Service: Endoscopy;  Laterality: N/A;  10:30am  EXCISION MORTON'S NEUROMA  10/16/2020   Procedure: EXCISION MORTON'S NEUROMA,AND REMOVAL OF INGROWN NAIL-GREAT TOES LEFT;  Surgeon: Tobie Franky SQUIBB, DPM;  Location: [facility];  Service: Podiatry;;   WISDOM TOOTH EXTRACTION  2011    MEDICATIONS: Medications Ordered Prior to Encounter[1]  ALLERGIES: Allergies[2]  FAMILY HISTORY: Family History  Problem  Relation Age of Onset   Depression Mother    Anxiety disorder Mother    Colon polyps Father    Colon cancer Father    Hypertension Father    Depression Sister    Alcohol abuse Maternal Uncle    Bipolar disorder Paternal Uncle    Schizophrenia Paternal Uncle    Esophageal cancer Maternal Grandmother    Throat cancer Maternal Grandmother    Rectal cancer Neg Hx    Stomach cancer Neg Hx     SOCIAL HISTORY: Social History   Socioeconomic History   Marital status: Significant Other    Spouse name: Not on file   Number of children: 1   Years of education: 12   Highest education level: High school graduate  Occupational History   Occupation: local truck driver  Tobacco Use   Smoking status: Every Day    Current packs/day: 0.50    Average packs/day: 0.5 packs/day for 4.0 years (2.0 ttl pk-yrs)    Types: Cigarettes   Smokeless tobacco: Former    Types: Snuff  Vaping Use   Vaping status: Never Used  Substance and Sexual Activity   Alcohol use: Not on file    Comment: 1-2 glasses of wine  few x week   Drug use: No   Sexual activity: Yes  Other Topics Concern   Not on file  Social History Narrative   Lives at home, along with his son and parents.   Left-sided.   1-3 cup coffee, occasional soda.   Social Drivers of Health   Tobacco Use: High Risk (09/07/2024)   Patient History    Smoking Tobacco Use: Every Day    Smokeless Tobacco Use: Former    Passive Exposure: Not on Actuary Strain: Not on file  Food Insecurity: Not on file  Transportation Needs: Not on file  Physical Activity: Not on file  Stress: Stress Concern Present (02/01/2022)   [employer] of Occupational Health - Occupational Stress Questionnaire    Feeling of Stress : Rather much  Social Connections: Not on file  Intimate Partner Violence: Not on file  Depression (PHQ2-9): Low Risk (09/07/2024)   Depression (PHQ2-9)    PHQ-2 Score: 0  Alcohol Screen: Not on file  Housing: Not  on file  Utilities: Not on file  Health Literacy: Not on file     PHYSICAL EXAM: Vitals:   09/20/24 0834  BP: 116/83  Pulse: (!) 106  SpO2: 94%   General: No acute distress, anxious Head:  Normocephalic/atraumatic Skin/Extremities: No rash, no edema Neurological Exam: Mental status: alert and awake, no dysarthria or aphasia, Fund of knowledge is appropriate.  Recent and remote memory are intact.  Attention and concentration are normal.     Cranial nerves: CN I: not tested CN II: pupils equal, round, visual fields intact CN III, IV, VI:  full range of motion, no nystagmus, no ptosis CN V: facial sensation intact CN VII: upper and lower face symmetric CN VIII: hearing intact to conversation Bulk & Tone: normal, no fasciculations. Motor: 5/5 throughout with no pronator drift. Sensation: intact to light touch, cold, pin, vibration sense.  No extinction  to double simultaneous stimulation.  Romberg test negative Deep Tendon Reflexes: +2 throughout Cerebellar: no incoordination on finger to nose testing Gait: narrow-based and steady, able to tandem walk adequately. Tremor: none   IMPRESSION: This is a pleasant 49 year old left-handed man with a history of hyperlipidemia, anxiety, presenting for evaluation of seizure-like activity. We discussed his symptoms, he is concerned due to his son's seizures and some similarity in symptoms, however suspicion for seizures with his history is very low. We discussed how an EEG (with photic stimulation) can help potentially reassure him on this, however we agreed at this point that it is not needed. I discussed that symptoms are consistent with significant anxiety, continue management with PCP and consideration for psychotherapy as well. Follow-up as needed, call for any changes.    Thank you for allowing me to participate in the care of this patient. Please do not hesitate to call for any questions or concerns.   Darice Shivers, M.D.  CC: Dr.  Tobie     [1]  Current Outpatient Medications on File Prior to Visit  Medication Sig Dispense Refill   albuterol  (VENTOLIN  HFA) 108 (90 Base) MCG/ACT inhaler Inhale 2 puffs into the lungs every 6 (six) hours as needed. 8 g 0   busPIRone  (BUSPAR ) 15 MG tablet Take 1 tablet (15 mg total) by mouth 2 (two) times daily. 60 tablet 3   cetirizine  (ZYRTEC ) 10 MG tablet Take 1 tablet (10 mg total) by mouth daily. 30 tablet 11   dexlansoprazole  (DEXILANT ) 60 MG capsule TAKE 1 CAPSULE(60 MG) BY MOUTH DAILY 90 capsule 3   diclofenac (VOLTAREN) 75 MG EC tablet Take 75 mg by mouth 2 (two) times daily.     dicyclomine  (BENTYL ) 20 MG tablet Take 1 tablet (20 mg total) by mouth 2 (two) times daily. 20 tablet 0   FLUoxetine  (PROZAC ) 10 MG capsule Take 1 capsule (10 mg total) by mouth daily. 30 capsule 3   fluticasone  (FLONASE ) 50 MCG/ACT nasal spray Place 1 spray into both nostrils daily. 16 g 0   methocarbamol  (ROBAXIN -750) 750 MG tablet Take 1 tablet (750 mg total) by mouth every 8 (eight) hours as needed for muscle spasms. 45 tablet 2   ondansetron  (ZOFRAN -ODT) 4 MG disintegrating tablet Take 1 tablet (4 mg total) by mouth every 8 (eight) hours as needed. 20 tablet 0   rosuvastatin  (CRESTOR ) 10 MG tablet TAKE 1 TABLET(10 MG) BY MOUTH DAILY 90 tablet 1   Vitamin D , Ergocalciferol , (DRISDOL ) 1.25 MG (50000 UNIT) CAPS capsule Take 1 capsule (50,000 Units total) by mouth every 7 (seven) days. 12 capsule 1   [DISCONTINUED] sertraline  (ZOLOFT ) 100 MG tablet 50 mg daily for four days, then 100 mg daily for four days, then 150 mg daily 45 tablet 0   No current facility-administered medications on file prior to visit.  [2]  Allergies Allergen Reactions   Cat Dander Itching   Paroxetine Hcl Other (See Comments)    Can not climax   "

## 2024-11-02 ENCOUNTER — Inpatient Hospital Stay
